# Patient Record
Sex: Female | Born: 1955 | Race: White | Hispanic: No | Marital: Married | Smoking: Former smoker
Health system: Southern US, Community
[De-identification: ages and names within clinical notes are randomized; demographics above are authoritative.]

## PROBLEM LIST (undated history)

## (undated) DIAGNOSIS — F32A Depression, unspecified: Secondary | ICD-10-CM

## (undated) DIAGNOSIS — F329 Major depressive disorder, single episode, unspecified: Secondary | ICD-10-CM

## (undated) DIAGNOSIS — M1990 Unspecified osteoarthritis, unspecified site: Secondary | ICD-10-CM

## (undated) DIAGNOSIS — G4730 Sleep apnea, unspecified: Secondary | ICD-10-CM

## (undated) DIAGNOSIS — F419 Anxiety disorder, unspecified: Secondary | ICD-10-CM

## (undated) HISTORY — PX: BUNIONECTOMY: SHX129

## (undated) HISTORY — PX: COSMETIC SURGERY: SHX468

## (undated) HISTORY — PX: TONSILLECTOMY: SUR1361

## (undated) HISTORY — DX: Anxiety disorder, unspecified: F41.9

## (undated) HISTORY — DX: Depression, unspecified: F32.A

## (undated) HISTORY — PX: NISSEN FUNDOPLICATION: SHX2091

## (undated) HISTORY — PX: JOINT REPLACEMENT: SHX530

## (undated) HISTORY — PX: SPINE SURGERY: SHX786

## (undated) HISTORY — PX: COLONOSCOPY: SHX174

## (undated) SURGERY — Surgical Case
Anesthesia: *Unknown

---
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
## 2006-12-09 HISTORY — PX: ANKLE SURGERY: SHX546

## 2007-12-10 HISTORY — PX: BACK SURGERY: SHX140

## 2010-12-09 HISTORY — PX: REPLACEMENT TOTAL KNEE: SUR1224

## 2019-07-23 ENCOUNTER — Other Ambulatory Visit: Payer: Self-pay

## 2019-07-23 ENCOUNTER — Ambulatory Visit: Payer: PRIVATE HEALTH INSURANCE | Admitting: Family

## 2019-07-23 ENCOUNTER — Encounter: Payer: Self-pay | Admitting: Family

## 2019-07-23 VITALS — BP 98/52 | HR 72 | Temp 98.5°F | Resp 16 | Ht 64.0 in | Wt 217.2 lb

## 2019-07-23 DIAGNOSIS — M7501 Adhesive capsulitis of right shoulder: Secondary | ICD-10-CM

## 2019-07-23 DIAGNOSIS — F419 Anxiety disorder, unspecified: Secondary | ICD-10-CM

## 2019-07-23 DIAGNOSIS — N393 Stress incontinence (female) (male): Secondary | ICD-10-CM | POA: Diagnosis not present

## 2019-07-23 DIAGNOSIS — G4700 Insomnia, unspecified: Secondary | ICD-10-CM | POA: Diagnosis not present

## 2019-07-23 DIAGNOSIS — F32A Depression, unspecified: Secondary | ICD-10-CM

## 2019-07-23 DIAGNOSIS — F329 Major depressive disorder, single episode, unspecified: Secondary | ICD-10-CM

## 2019-07-23 MED ORDER — SERTRALINE HCL 100 MG PO TABS: 100.0000 mg | ORAL_TABLET | Freq: Every day | ORAL | 1 refills | Status: DC

## 2019-07-23 MED ORDER — ZOLPIDEM TARTRATE 5 MG PO TABS: ORAL_TABLET | ORAL | 0 refills | Status: DC

## 2019-07-23 NOTE — Progress Notes (Signed)
Subjective:    Patient ID: Beth HumblesHeather [name], female    DOB: 02/27/1956, 63 y.o.   MRN: [id number]  HPI  Patient is a 63 year old female who presents today to establish care.  She was previously [facility] at [facility] by Dr. Tyson AliasJohn [name].  Past medical history is significant for the following:  Depression/anxiety-she is currently maintained on sertraline 100 mg once daily. Reports that she sometimes forgets doses.    Stress incontinence-stress incontinence is being managed by urology Dr. Gala [name].   Hx of Frozen shoulder- has had stem cells injection and PT. Has not seen anyone recently.  Notes no improvement with meloxicam.    Morbid obesity- Reports that she has lost 32 pounds. Wants to lose 50 more pounds.  Insomnia- reports that she uses tylenol pm every night.  Last night didn't fall asleep until 6 AM.  Sometimes   Review of Systems  Constitutional: Negative for unexpected weight change.  HENT: Negative for hearing loss and rhinorrhea.   Eyes: Negative for visual disturbance.  Respiratory: Negative for cough and shortness of breath.   Cardiovascular: Negative for chest pain and leg swelling.  Gastrointestinal: Negative for blood in stool, constipation and diarrhea.  Genitourinary: Negative for dysuria, frequency and hematuria.  Musculoskeletal: Negative for arthralgias (right shoulder only) and myalgias.  Skin: Negative for rash.  Neurological: Negative for headaches.  Hematological: Negative for adenopathy.  Psychiatric/Behavioral: Negative for sleep disturbance.       See HPI   Past Medical History:  Diagnosis Date  . Anxiety   . Depression      Social History   Socioeconomic History  . Marital status: Married    Spouse name: Beth [name]  . Number of children: 2  . Years of education: Not on file  . Highest education level: Not on file  Occupational History  . Occupation: retired  Engineer, productionocial Needs  . Financial resource strain: Not hard at all  . Food insecurity    Worry: Never true    Inability: Never true  . Transportation needs    Medical: Not on file    Non-medical: No  Tobacco Use  . Smoking status: Former Smoker    Types: Cigarettes    Quit date: 07/22/1989    Years since quitting: 30.0  . Smokeless tobacco: Never Used  Substance and Sexual Activity  . Alcohol use: Yes  . Drug use: Never  . Sexual activity: Yes    Partners: Male  Lifestyle  . Physical activity    Days per week: 1 day    Minutes per session: 30 min  . Stress: Not on file  Relationships  . Social connections    Talks on phone: More than three times a week    Gets together: Never    Attends religious service: More than 4 times per year    Active member of club or organization: No    Attends meetings of clubs or organizations: Never    Relationship status: Married  . Intimate partner violence    Fear of current or ex partner: No    Emotionally abused: No    Physically abused: No    Forced sexual activity: No  Other Topics Concern  . Not on file  Social History Narrative  . Not on file    Past Surgical History:  Procedure Laterality Date  . ANKLE SURGERY Right 2008  . BACK SURGERY  2009  . REPLACEMENT TOTAL KNEE Right 2012    Family History  Problem Relation Age  of Onset  . Breast cancer Mother   . Diabetes Father   . Hydrocephalus Father     Not on File  Current Outpatient Medications on File Prior to Visit  Medication Sig Dispense Refill  . meloxicam (MOBIC) 15 MG tablet Take by mouth.    . sertraline (ZOLOFT) 100 MG tablet TAKE ONE TABLET BY MOUTH ONCE A DAY     No current facility-administered medications on file prior to visit.     BP (!) 98/52 (BP Location: Right Arm, Patient Position: Sitting, Cuff Size: Large)   Pulse 72   Temp 98.5 F (36.9 C) (Oral)   Resp 16   Ht 5\' 4"  (1.626 m)   Wt 217 lb 3.2 oz (98.5 kg)   SpO2 96%   BMI 37.28 kg/m       Objective:   Physical Exam Constitutional:      Appearance: Normal appearance.  She is well-developed. She is obese.  HENT:     Head: Normocephalic and atraumatic.     Right Ear: Tympanic membrane and ear canal normal.     Left Ear: Tympanic membrane and ear canal normal.  Neck:     Musculoskeletal: Neck supple. No muscular tenderness.     Thyroid: No thyromegaly.  Cardiovascular:     Rate and Rhythm: Normal rate and regular rhythm.     Heart sounds: Normal heart sounds. No murmur.  Pulmonary:     Effort: Pulmonary effort is normal. No respiratory distress.     Breath sounds: Normal breath sounds. No wheezing.  Musculoskeletal:        General: No swelling.  Lymphadenopathy:     Cervical: No cervical adenopathy.  Skin:    General: Skin is warm and dry.  Neurological:     Mental Status: She is alert and oriented to person, place, and time.  Psychiatric:        Behavior: Behavior normal.        Thought Content: Thought content normal.        Judgment: Judgment normal.           Assessment & Plan:  Depression/anxiety- stable. See below.  Restart zoloft.  Refill sent.  [facility] 07/23/2019  Decreased Interest 0  Down, Depressed, Hopeless 1  PHQ - 2 Score 1  Altered sleeping 2  Tired, decreased energy 1  Change in appetite 0  Feeling bad or failure about yourself  0  Trouble concentrating 0  Moving slowly or fidgety/restless 0  Suicidal thoughts 0  PHQ-9 Score 4  Difficult doing work/chores Not difficult at all   GAD 7 : Generalized Anxiety Score 07/23/2019  Nervous, Anxious, on Edge 0  Control/stop worrying 1  Worry too much - different things 1  Trouble relaxing 0  Restless 0  Easily annoyed or irritable 1  Afraid - awful might happen 0  Total GAD 7 Score 3  Anxiety Difficulty Not difficult at all   Insomnia- usually controlled but would like something for nights when she can't fall asleep at all. Will initiate ambien for sparing prn use. Controlled substance contract is signed today and UDS will be obtained. Reviewed [place]  controlled substance registry.  Hx of frozen shoulder- we discussed referral to orthopedics at this time and she declines. Will let me know if she changes her mind.   Morbid obesity- she is loosing weight. Encouraged her to keep up the good work and add some regular walking to her routine.   Hx of  stress incontinence- management per urology.

## 2019-07-25 LAB — PAIN MGMT, PROFILE 8 W/CONF, U
6 Acetylmorphine: NEGATIVE ng/mL
Alcohol Metabolites: NEGATIVE ng/mL (ref <500)
Alphahydroxyalprazolam: NEGATIVE ng/mL
Alphahydroxymidazolam: NEGATIVE ng/mL
Alphahydroxytriazolam: NEGATIVE ng/mL
Aminoclonazepam: NEGATIVE ng/mL
Amphetamines: NEGATIVE ng/mL
Benzodiazepines: NEGATIVE ng/mL
Buprenorphine, Urine: NEGATIVE ng/mL
Cocaine Metabolite: NEGATIVE ng/mL
Creatinine: 140.4 mg/dL
Hydroxyethylflurazepam: NEGATIVE ng/mL
Lorazepam: NEGATIVE ng/mL
MDMA: NEGATIVE ng/mL
Marijuana Metabolite: NEGATIVE ng/mL
Nordiazepam: NEGATIVE ng/mL
Opiates: NEGATIVE ng/mL
Oxazepam: NEGATIVE ng/mL
Oxidant: NEGATIVE ug/mL
Oxycodone: NEGATIVE ng/mL
Temazepam: NEGATIVE ng/mL
pH: 7.2 (ref 4.5–9.0)

## 2019-10-25 ENCOUNTER — Other Ambulatory Visit: Payer: Self-pay

## 2019-10-26 ENCOUNTER — Encounter: Payer: Self-pay | Admitting: Family

## 2019-10-26 ENCOUNTER — Ambulatory Visit (INDEPENDENT_AMBULATORY_CARE_PROVIDER_SITE_OTHER): Payer: PRIVATE HEALTH INSURANCE | Admitting: Family

## 2019-10-26 VITALS — BP 106/63 | HR 77 | Temp 96.4°F | Resp 16 | Ht 64.0 in | Wt 220.0 lb

## 2019-10-26 DIAGNOSIS — Z23 Encounter for immunization: Secondary | ICD-10-CM

## 2019-10-26 DIAGNOSIS — G4700 Insomnia, unspecified: Secondary | ICD-10-CM

## 2019-10-26 DIAGNOSIS — L989 Disorder of the skin and subcutaneous tissue, unspecified: Secondary | ICD-10-CM

## 2019-10-26 DIAGNOSIS — Z0000 Encounter for general adult medical examination without abnormal findings: Secondary | ICD-10-CM | POA: Diagnosis not present

## 2019-10-26 DIAGNOSIS — Z1211 Encounter for screening for malignant neoplasm of colon: Secondary | ICD-10-CM

## 2019-10-26 LAB — HEPATIC FUNCTION PANEL
ALT: 20 U/L (ref 0–35)
AST: 15 U/L (ref 0–37)
Albumin: 4.3 g/dL (ref 3.5–5.2)
Alkaline Phosphatase: 117 U/L (ref 39–117)
Bilirubin, Direct: 0.1 mg/dL (ref 0.0–0.3)
Total Bilirubin: 0.4 mg/dL (ref 0.2–1.2)
Total Protein: 6.8 g/dL (ref 6.0–8.3)

## 2019-10-26 LAB — BASIC METABOLIC PANEL
BUN: 17 mg/dL (ref 6–23)
CO2: 29 mEq/L (ref 19–32)
Calcium: 9.1 mg/dL (ref 8.4–10.5)
Chloride: 103 mEq/L (ref 96–112)
Creatinine, Ser: 0.93 mg/dL (ref 0.40–1.20)
GFR: 61.02 mL/min (ref >60.00)
Glucose, Bld: 94 mg/dL (ref 70–99)
Potassium: 4.9 mEq/L (ref 3.5–5.1)
Sodium: 139 mEq/L (ref 135–145)

## 2019-10-26 LAB — CBC WITH DIFFERENTIAL/PLATELET
Basophils Absolute: 0.1 10*3/uL (ref 0.0–0.1)
Basophils Relative: 0.7 % (ref 0.0–3.0)
Eosinophils Absolute: 0.1 10*3/uL (ref 0.0–0.7)
Eosinophils Relative: 1.5 % (ref 0.0–5.0)
HCT: 38.8 % (ref 36.0–46.0)
Hemoglobin: 12.7 g/dL (ref 12.0–15.0)
Lymphocytes Relative: 23.7 % (ref 12.0–46.0)
Lymphs Abs: 1.9 10*3/uL (ref 0.7–4.0)
MCHC: 32.8 g/dL (ref 30.0–36.0)
MCV: 93.1 fl (ref 78.0–100.0)
Monocytes Absolute: 0.4 10*3/uL (ref 0.1–1.0)
Monocytes Relative: 5.4 % (ref 3.0–12.0)
Neutro Abs: 5.5 10*3/uL (ref 1.4–7.7)
Neutrophils Relative %: 68.7 % (ref 43.0–77.0)
Platelets: 279 10*3/uL (ref 150.0–400.0)
RBC: 4.17 Mil/uL (ref 3.87–5.11)
RDW: 13.7 % (ref 11.5–15.5)
WBC: 8.1 10*3/uL (ref 4.0–10.5)

## 2019-10-26 LAB — LIPID PANEL
Cholesterol: 234 mg/dL — ABNORMAL HIGH (ref 0–200)
HDL: 53.7 mg/dL (ref >39.00)
LDL Cholesterol: 142 mg/dL — ABNORMAL HIGH (ref 0–99)
NonHDL: 179.97
Total CHOL/HDL Ratio: 4
Triglycerides: 190 mg/dL — ABNORMAL HIGH (ref 0.0–149.0)
VLDL: 38 mg/dL (ref 0.0–40.0)

## 2019-10-26 LAB — TSH: TSH: 2.35 u[IU]/mL (ref 0.35–4.50)

## 2019-10-26 MED ORDER — TEMAZEPAM 7.5 MG PO CAPS: 7.5000 mg | ORAL_CAPSULE | Freq: Every evening | ORAL | 0 refills | Status: DC | PRN

## 2019-10-26 NOTE — Progress Notes (Signed)
Subjective:    Patient ID: Beth [name], female    DOB: August 02, 1956, 63 y.o.   MRN: [id number]  HPI   Patient presents today for complete physical.  Immunizations: flu shot up to date, shingrix #2 today  Diet: has gained 3 pounds,  Taking a break  From dieting Exercise: walks 3-4 days a week Wt Readings from Last 3 Encounters:  10/26/19 220 lb (99.8 kg)  07/23/19 217 lb 3.2 oz (98.5 kg)  Colonoscopy: 12 years ago,  Dexa:  declines Pap Smear: 2016- declines Mammogram: due  Insomnia- last visit we initiated ambien for prn use.      Review of Systems  Constitutional: Negative for unexpected weight change.  HENT: Negative for hearing loss and rhinorrhea.   Eyes: Negative for visual disturbance.  Respiratory: Negative for cough and shortness of breath.   Cardiovascular: Negative for chest pain.  Gastrointestinal: Negative for constipation and diarrhea.  Genitourinary: Negative for dysuria, frequency and hematuria.  Musculoskeletal: Positive for arthralgias (right shoulder pain only). Negative for myalgias.  Skin:       Notes skin lesion on left dorsal hand  Neurological: Negative for headaches.  Hematological: Negative for adenopathy.  Psychiatric/Behavioral:       Reports stable mood on zoloft.    Past Medical History:  Diagnosis Date  . Anxiety   . Depression      Social History   Socioeconomic History  . Marital status: Married    Spouse name: Ron  . Number of children: 2  . Years of education: Not on file  . Highest education level: Not on file  Occupational History  . Occupation: retired  Engineer, production  . Financial resource strain: Not hard at all  . Food insecurity    Worry: Never true    Inability: Never true  . Transportation needs    Medical: Not on file    Non-medical: No  Tobacco Use  . Smoking status: Former Smoker    Types: Cigarettes    Quit date: 07/22/1989    Years since quitting: 30.2  . Smokeless tobacco: Never Used  Substance and  Sexual Activity  . Alcohol use: Yes  . Drug use: Never  . Sexual activity: Yes    Partners: Male  Lifestyle  . Physical activity    Days per week: 1 day    Minutes per session: 30 min  . Stress: Not on file  Relationships  . Social connections    Talks on phone: More than three times a week    Gets together: Never    Attends religious service: More than 4 times per year    Active member of club or organization: No    Attends meetings of clubs or organizations: Never    Relationship status: Married  . Intimate partner violence    Fear of current or ex partner: No    Emotionally abused: No    Physically abused: No    Forced sexual activity: No  Other Topics Concern  . Not on file  Social History Narrative   Retired Music therapist   Divorced (from [place] originally)   Remarried 2015 (husband is a IT trainer)   Has a dog   2 children (son and daughter) both married, daughter lives in [place], son in [place]. No grandchildren   Enjoys baking, sewing, reading    Past Surgical History:  Procedure Laterality Date  . ANKLE SURGERY Right 2008  . BACK SURGERY  2009  . REPLACEMENT TOTAL KNEE Right 2012  Family History  Problem Relation Age of Onset  . Breast cancer Mother   . Diabetes Father   . Hydrocephalus Father     Not on File  Current Outpatient Medications on File Prior to Visit  Medication Sig Dispense Refill  . sertraline (ZOLOFT) 100 MG tablet Take 1 tablet (100 mg total) by mouth daily. 90 tablet 1  . zolpidem (AMBIEN) 5 MG tablet Take 1 tablet by mouth once daily as needed for insomnia. 15 tablet 0   No current facility-administered medications on file prior to visit.     BP 106/63 (BP Location: Right Arm, Patient Position: Sitting, Cuff Size: Large)   Pulse 77   Temp (!) 96.4 F (35.8 C) (Temporal)   Resp 16   Ht 5\' 4"  (1.626 m)   Wt 220 lb (99.8 kg)   SpO2 97%   BMI 37.76 kg/m        Objective:   Physical Exam  Physical Exam  Constitutional: She is  oriented to person, place, and time. She appears well-developed and well-nourished. No distress.  HENT:  Head: Normocephalic and atraumatic.  Right Ear: Tympanic membrane and ear canal normal.  Left Ear: Tympanic membrane and ear canal normal.  Mouth/Throat: Not examined- pt wearing mask  Eyes: Pupils are equal, round, and reactive to light. No scleral icterus.  Neck: Normal range of motion. No thyromegaly present.  Cardiovascular: Normal rate and regular rhythm.   No murmur heard. Pulmonary/Chest: Effort normal and breath sounds normal. No respiratory distress. He has no wheezes. She has no rales. She exhibits no tenderness.  Abdominal: Soft. Bowel sounds are normal. She exhibits no distension and no mass. There is no tenderness. There is no rebound and no guarding.  Musculoskeletal: She exhibits no edema.  Lymphadenopathy:    She has no cervical adenopathy.  Neurological: She is alert and oriented to person, place, and time. She has normal patellar reflexes. She exhibits normal muscle tone. Coordination normal.  Skin: Skin is warm and dry.  Psychiatric: She has a normal mood and affect. Her behavior is normal. Judgment and thought content normal.  Breasts: Examined lying Right: Without masses, retractions, discharge or axillary adenopathy.  Left: Without masses, retractions, discharge or axillary adenopathy.             Assessment & Plan:          Assessment & Plan:  Preventative care- discussed diet, exercise, weight loss. Refer for mammo. Declines colo/cologuard due to cost but would like to do hemocult. Declines pap due to cost.    Skin lesion- refer to dermatologist- she has seen Dr. Susie Cassette and would like to return to her.  Insomnia- no improvement with Lorrin Mais- has tried temazepam in the past with good result. Will change from ambien to prn temazepam.

## 2019-10-26 NOTE — Patient Instructions (Addendum)
Please complete lab work prior to leaving. Continue your work on healthy diet, exercise and weight loss. Return stool sample by mail at your earliest convenience.  Stop ambien, start temazepam as needed for sleep.

## 2019-10-27 ENCOUNTER — Encounter: Payer: Self-pay | Admitting: Family

## 2020-01-06 ENCOUNTER — Telehealth: Payer: Self-pay | Admitting: Family

## 2020-01-06 DIAGNOSIS — M7500 Adhesive capsulitis of unspecified shoulder: Secondary | ICD-10-CM

## 2020-01-06 NOTE — Telephone Encounter (Signed)
Caller Name: self Phone: [phone] Referral request: Orthopedic Surgeon Reason: frozen right shoulder - this was discussed in OV 10/26/2019 per pt

## 2020-01-06 NOTE — Telephone Encounter (Signed)
Referral requested

## 2020-01-06 NOTE — Addendum Note (Signed)
Addended by: Sandford Craze on: 01/06/2020 04:51 PM   Modules accepted: Orders

## 2020-01-11 ENCOUNTER — Ambulatory Visit (INDEPENDENT_AMBULATORY_CARE_PROVIDER_SITE_OTHER): Payer: PRIVATE HEALTH INSURANCE

## 2020-01-11 ENCOUNTER — Encounter: Payer: Self-pay | Admitting: Orthopaedic Surgery

## 2020-01-11 ENCOUNTER — Ambulatory Visit (INDEPENDENT_AMBULATORY_CARE_PROVIDER_SITE_OTHER): Payer: PRIVATE HEALTH INSURANCE | Admitting: Orthopaedic Surgery

## 2020-01-11 ENCOUNTER — Other Ambulatory Visit: Payer: Self-pay

## 2020-01-11 VITALS — Ht 64.0 in | Wt 225.0 lb

## 2020-01-11 DIAGNOSIS — M25511 Pain in right shoulder: Secondary | ICD-10-CM

## 2020-01-11 DIAGNOSIS — G8929 Other chronic pain: Secondary | ICD-10-CM

## 2020-01-11 DIAGNOSIS — M19011 Primary osteoarthritis, right shoulder: Secondary | ICD-10-CM

## 2020-01-11 NOTE — Progress Notes (Signed)
Office Visit Note   Patient: Beth [name]           Date of Birth: 1955/12/23           MRN: [id number] Visit Date: 01/11/2020              Requested by: Sandford Craze, NP [address] [place],  [place] [address] PCP: Sandford Craze, NP   Assessment & Plan: Visit Diagnoses:  1. Chronic right shoulder pain   2. Primary osteoarthritis, right shoulder     Plan: End-stage osteoarthritis right shoulder with significant pain and limitation of motion.  Will obtain thin slice CT scan right shoulder refer to Dr. August Saucer for consideration of shoulder replacement surgery.  Long discussion discussing x-rays and different treatment options.  Office visit over 45 minutes 50% of the time in counseling  Follow-Up Instructions: No follow-ups on file.   Orders:  Orders Placed This Encounter  Procedures  . XR Shoulder Right  . CT SHOULDER RIGHT WO CONTRAST  . Ambulatory referral to Orthopedic Surgery   No orders of the defined types were placed in this encounter.     Procedures: No procedures performed   Clinical Data: No additional findings.   Subjective: Chief Complaint  Patient presents with  . Right Shoulder - Pain  Patient presents today for right shoulder pain. She said that she has had frozen shoulder for 10years.  She has limited range of motion. Her pain is located all throughout her shoulder and sometimes will move down her arm. She is right hand dominant. She has occasional numbness, tingling, and weakness. She does not take anything for pain. No had therapy on her shoulder 3 years ago.  Frustrated with the limited use of her right arm despite time, medicines and therapy.  Having difficulty performing activities of daily living and even sleeping at night.  HPI  Review of Systems   Objective: Vital Signs: Ht 5\' 4"  (1.626 m)   Wt 225 lb (102.1 kg)   BMI 38.62 kg/m   Physical Exam Constitutional:      Appearance: She is well-developed.  Eyes:      Pupils: Pupils are equal, round, and reactive to light.  Pulmonary:     Effort: Pulmonary effort is normal.  Skin:    General: Skin is warm and dry.  Neurological:     Mental Status: She is alert and oriented to person, place, and time.  Psychiatric:        Behavior: Behavior normal.     Ortho Exam approximately 15 degrees of external rotation right shoulder with pain at that point and about 70 degrees of internal rotation.  I can flex about 120 degrees and abduct about 80 degrees.  Positive crepitation with motion of the glenohumeral joint.  Biceps intact.  Good grip and release.  Skin intact  Specialty Comments:  No specialty comments available.  Imaging: XR Shoulder Right  Result Date: 01/11/2020 Films of the right shoulder obtained in several projections.  There is advanced osteoarthritis involving the glenohumeral joint with irregularity of the humeral head and a very large inferior humeral head osteophyte.  Normal space between the humeral head and the acromion.  Humeral head appears to be centered at the glenoid.  Glenohumeral joint space is narrowed.  Some degenerative changes at the [facility] joint.  No ectopic calcification    PMFS History: Patient Active Problem List   Diagnosis Date Noted  . Primary osteoarthritis, right shoulder 01/11/2020  Past Medical History:  Diagnosis Date  . Anxiety   . Depression     Family History  Problem Relation Age of Onset  . Breast cancer Mother   . Diabetes Father   . Hydrocephalus Father     Past Surgical History:  Procedure Laterality Date  . ANKLE SURGERY Right 2008  . BACK SURGERY  2009  . REPLACEMENT TOTAL KNEE Right 2012   Social History   Occupational History  . Occupation: retired  Tobacco Use  . Smoking status: Former Smoker    Types: Cigarettes    Quit date: 07/22/1989    Years since quitting: 30.4  . Smokeless tobacco: Never Used  Substance and Sexual Activity  . Alcohol use: Yes  . Drug use: Never  .  Sexual activity: Yes    Partners: Male

## 2020-01-19 ENCOUNTER — Other Ambulatory Visit: Payer: Self-pay

## 2020-01-19 ENCOUNTER — Ambulatory Visit (HOSPITAL_BASED_OUTPATIENT_CLINIC_OR_DEPARTMENT_OTHER)
Admission: RE | Admit: 2020-01-19 | Discharge: 2020-01-19 | Disposition: A | Discharge status: Home / Self Care | Payer: No Typology Code available for payment source | Source: Ambulatory Visit | Attending: Orthopaedic Surgery | Admitting: Orthopaedic Surgery

## 2020-01-19 DIAGNOSIS — M25511 Pain in right shoulder: Secondary | ICD-10-CM | POA: Insufficient documentation

## 2020-01-19 DIAGNOSIS — G8929 Other chronic pain: Secondary | ICD-10-CM | POA: Diagnosis present

## 2020-01-26 ENCOUNTER — Other Ambulatory Visit: Payer: Self-pay

## 2020-01-26 ENCOUNTER — Ambulatory Visit (INDEPENDENT_AMBULATORY_CARE_PROVIDER_SITE_OTHER): Payer: PRIVATE HEALTH INSURANCE | Admitting: Orthopedic Surgery

## 2020-01-26 DIAGNOSIS — M19011 Primary osteoarthritis, right shoulder: Secondary | ICD-10-CM | POA: Diagnosis not present

## 2020-01-27 ENCOUNTER — Encounter: Payer: Self-pay | Admitting: Orthopedic Surgery

## 2020-01-27 NOTE — Progress Notes (Signed)
Office Visit Note   Patient: Beth [name]           Date of Birth: Oct 28, 1956           MRN: [id number] Visit Date: 01/26/2020 Requested by: Valeria Batman, MD [address] [place],  [place] [address] PCP: Sandford Craze, NP  Subjective: Chief Complaint  Patient presents with  . Right Shoulder - Pain    HPI: Beth [name] is a 64 year old patient with 10-year history of right shoulder pain.  She worked at [employer] and is now retired.  The pain has become worse over time.  She is relatively low demand at this time in terms of putting stress on her shoulder.  She is right-hand dominant.  The shoulder pain does wake her from sleep at night.  She has tried physical therapy and injections without much relief.  Left shoulder has normal function by report history              ROS: All systems reviewed are negative as they relate to the chief complaint within the history of present illness.  Patient denies  fevers or chills.   Assessment & Plan: Visit Diagnoses:  1. Primary osteoarthritis, right shoulder     Plan: Impression is end-stage arthritis in the right shoulder with some narrowing of the acromiohumeral distance but good rotator cuff strength on exam today.  She also has some glenoid deformity.  She is 64 years old which puts her in a tricky age category for primary shoulder replacement versus reverse shoulder replacement.  In general it would be advisable to try to do some type of primary shoulder replacement if possible.  That may require augmented glenoid versus convertible glenoid depending on the status of the rotator cuff.  If the rotator cuff is not viable then her best course would be to undergo reverse shoulder replacement.  The risk and benefits are discussed.  We do need thin cut CT scan for preoperative evaluation.  It may be a game time decision depending on how that supraspinatus looks as to whether or not she receives augmented glenoid baseplate with primary  shoulder replacement, convertible glenoid baseplate with primary shoulder replacement, versus reverse shoulder replacement.  Follow-Up Instructions: No follow-ups on file.   Orders:  No orders of the defined types were placed in this encounter.  No orders of the defined types were placed in this encounter.     Procedures: No procedures performed   Clinical Data: No additional findings.  Objective: Vital Signs: There were no vitals taken for this visit.  Physical Exam:   Constitutional: Patient appears well-developed HEENT:  Head: Normocephalic Eyes:EOM are normal Neck: Normal range of motion Cardiovascular: Normal rate Pulmonary/chest: Effort normal Neurologic: Patient is alert Skin: Skin is warm Psychiatric: Patient has normal mood and affect    Ortho Exam: Ortho exam demonstrates forward flexion abduction both below 90 degrees.  External rotation at 15 degrees of abduction is about 20 degrees.  Supraspinatus strength subscap strength and infraspinatus strength are 5+ out of 5 right and left.  She has intact deltoid function.  Fair amount of pain with passive range of motion.  Neck range of motion is full.  Motor or sensory function to the hand is intact.  Radial pulse is intact.  Specialty Comments:  No specialty comments available.  Imaging: No results found.   PMFS History: Patient Active Problem List   Diagnosis Date Noted  . Primary osteoarthritis, right shoulder 01/11/2020   Past Medical History:  Diagnosis Date  . Anxiety   . Depression     Family History  Problem Relation Age of Onset  . Breast cancer Mother   . Diabetes Father   . Hydrocephalus Father     Past Surgical History:  Procedure Laterality Date  . ANKLE SURGERY Right 2008  . BACK SURGERY  2009  . REPLACEMENT TOTAL KNEE Right 2012   Social History   Occupational History  . Occupation: retired  Tobacco Use  . Smoking status: Former Smoker    Types: Cigarettes    Quit date:  07/22/1989    Years since quitting: 30.5  . Smokeless tobacco: Never Used  Substance and Sexual Activity  . Alcohol use: Yes  . Drug use: Never  . Sexual activity: Yes    Partners: Male

## 2020-01-28 ENCOUNTER — Other Ambulatory Visit: Payer: Self-pay

## 2020-01-28 DIAGNOSIS — M19011 Primary osteoarthritis, right shoulder: Secondary | ICD-10-CM

## 2020-01-28 NOTE — Progress Notes (Signed)
Tried calling patient to discuss that I had put in new order for CT scan of shoulder per Dr August Saucer. Previous CT did not have the specific thin slice needed for Dr Alfonso Patten PSI that he could potentially use in the even patient needs surgery. I have put in new order and left detailed VM for patient advising.

## 2020-01-31 ENCOUNTER — Encounter: Payer: Self-pay | Admitting: Family

## 2020-02-03 NOTE — Progress Notes (Signed)
Mailed out to pt 

## 2020-02-14 ENCOUNTER — Other Ambulatory Visit: Payer: Self-pay

## 2020-02-14 ENCOUNTER — Ambulatory Visit
Admission: RE | Admit: 2020-02-14 | Discharge: 2020-02-14 | Disposition: A | Discharge status: Home / Self Care | Payer: PRIVATE HEALTH INSURANCE | Source: Ambulatory Visit | Attending: Orthopedic Surgery | Admitting: Orthopedic Surgery

## 2020-02-14 DIAGNOSIS — M19011 Primary osteoarthritis, right shoulder: Secondary | ICD-10-CM

## 2020-02-17 ENCOUNTER — Ambulatory Visit (INDEPENDENT_AMBULATORY_CARE_PROVIDER_SITE_OTHER): Payer: PRIVATE HEALTH INSURANCE | Admitting: Orthopedic Surgery

## 2020-02-17 ENCOUNTER — Other Ambulatory Visit: Payer: Self-pay

## 2020-02-17 DIAGNOSIS — M19011 Primary osteoarthritis, right shoulder: Secondary | ICD-10-CM | POA: Diagnosis not present

## 2020-02-18 ENCOUNTER — Encounter: Payer: Self-pay | Admitting: Orthopedic Surgery

## 2020-02-18 NOTE — Progress Notes (Signed)
Office Visit Note   Patient: Beth [name]           Date of Birth: 05-17-1956           MRN: [id number] Visit Date: 02/17/2020 Requested by: Debbrah Alar, NP [address] [place],  [place] [address] PCP: Debbrah Alar, NP  Subjective: Chief Complaint  Patient presents with  . Follow-up    HPI: Beth [name] is a 64 y.o. female who presents to the office complaining of right shoulder pain.  She returns for right shoulder CT scan review.  She notes continued pain.  She is only taking occasional Tylenol for pain.  She has no history of long use of opioid medication.  She notes that she has no significant weakness of the shoulder but does report decreased range of motion.  She has a history of depression but denies any heart history, pulmonary history, renal history.  Denies any history of diabetes or any personal/family history of blood clots.  She does not smoke..                ROS:  All systems reviewed are negative as they relate to the chief complaint within the history of present illness.  Patient denies fevers or chills.  Assessment & Plan: Visit Diagnoses:  1. Primary osteoarthritis, right shoulder     Plan: Patient is a 64 year old female who presents with CT of right shoulder that shows severe glenohumeral osteoarthritis.  Shoulder replacement was discussed today and patient would like to proceed.  Discussed the risks and benefits of the procedure including nerve and vessel damage, infection, shoulder stiffness, need for revision surgery.  Patient will be posted for total shoulder arthroplasty with convertible baseplate versus a reverse shoulder arthroplasty.  We will make this decision at time of surgery based on how the rotator cuff looks.  She does have good rotator cuff strength on exam.  The type of replacement will be a game time decision for Tinlee.  She does have reasonable strength although on my exam today it is slightly weaker than on  previous exams.  The acromiohumeral interval is definitely smaller on CT scan but there is some of the cuff that appears intact.  If her cuff is intact I would likely favor convertible glenoid.  Has a cuff at all looks involved then we may have to proceed with reverse replacement even though she is 42.  She really cannot live with what she has at this time.  Patient understands the risk and benefits including not limited to infection nerve vessel damage instability as well as potential for revision in her lifetime.  All questions answered  Follow-Up Instructions: No follow-ups on file.   Orders:  No orders of the defined types were placed in this encounter.  No orders of the defined types were placed in this encounter.     Procedures: No procedures performed   Clinical Data: No additional findings.  Objective: Vital Signs: There were no vitals taken for this visit.  Physical Exam:  Constitutional: Patient appears well-developed HEENT:  Head: Normocephalic Eyes:EOM are normal Neck: Normal range of motion Cardiovascular: Normal rate Pulmonary/chest: Effort normal Neurologic: Patient is alert Skin: Skin is warm Psychiatric: Patient has normal mood and affect  Ortho Exam:  Right shoulder Exam Decreased external rotation, forward flexion, abduction compared with the contralateral side.  Painful at terminal range of motion.  Coarseness and grinding is felt during range of motion. Good subscapularis, supraspinatus, and infraspinatus strength  5/5 grip strength, forearm pronation/supination, and bicep strength  Specialty Comments:  No specialty comments available.  Imaging: No results found.   PMFS History: Patient Active Problem List   Diagnosis Date Noted  . Primary osteoarthritis, right shoulder 01/11/2020   Past Medical History:  Diagnosis Date  . Anxiety   . Depression     Family History  Problem Relation Age of Onset  . Breast cancer Mother   . Diabetes Father    . Hydrocephalus Father     Past Surgical History:  Procedure Laterality Date  . ANKLE SURGERY Right 2008  . BACK SURGERY  2009  . REPLACEMENT TOTAL KNEE Right 2012   Social History   Occupational History  . Occupation: retired  Tobacco Use  . Smoking status: Former Smoker    Types: Cigarettes    Quit date: 07/22/1989    Years since quitting: 30.5  . Smokeless tobacco: Never Used  Substance and Sexual Activity  . Alcohol use: Yes  . Drug use: Never  . Sexual activity: Yes    Partners: Male

## 2020-03-16 ENCOUNTER — Other Ambulatory Visit (INDEPENDENT_AMBULATORY_CARE_PROVIDER_SITE_OTHER): Payer: No Typology Code available for payment source

## 2020-03-16 DIAGNOSIS — Z1211 Encounter for screening for malignant neoplasm of colon: Secondary | ICD-10-CM | POA: Diagnosis not present

## 2020-03-16 LAB — FECAL OCCULT BLOOD, IMMUNOCHEMICAL: Fecal Occult Bld: NEGATIVE

## 2020-03-16 NOTE — Addendum Note (Signed)
Addended by: Mervin Kung A on: 03/16/2020 03:28 PM   Modules accepted: Orders

## 2020-03-17 ENCOUNTER — Other Ambulatory Visit (HOSPITAL_COMMUNITY): Payer: PRIVATE HEALTH INSURANCE

## 2020-03-23 ENCOUNTER — Other Ambulatory Visit: Payer: Self-pay

## 2020-03-23 ENCOUNTER — Telehealth: Payer: Self-pay | Admitting: Family

## 2020-03-23 MED ORDER — SERTRALINE HCL 100 MG PO TABS: 100.0000 mg | ORAL_TABLET | Freq: Every day | ORAL | 1 refills | Status: DC

## 2020-03-23 NOTE — Telephone Encounter (Signed)
Medication sent.

## 2020-03-23 NOTE — Telephone Encounter (Signed)
Medication:sertraline (ZOLOFT) 100 MG tablet  Has the patient contacted their pharmacy? No. (If no, request that the patient contact the pharmacy for the refill.) (If yes, when and what did the pharmacy advise?)  Preferred Pharmacy (with phone number or street name): [id number] - [place], [place] - [address] Phone:  [phone]  Fax:  [phone]      Agent: Please be advised that RX refills may take up to 3 business days. We ask that you follow-up with your pharmacy.

## 2020-03-23 NOTE — Progress Notes (Signed)
[place], [place] [address] Phone: [phone] Fax: [phone]  [facility] [address], [place] [address] Phone: [phone] Fax: [phone]      Your procedure is scheduled on Tuesday, March 28, 2020.  Report to [facility] Main Entrance "A" at 10:00 A.M., and check in at the Admitting office.  Call this number if you have problems the morning of surgery:  [phone]  Call [phone] if you have any questions prior to your surgery date Monday-Friday 8am-4pm    Remember:  Do not eat after midnight the night before your surgery  You may drink clear liquids until 9:00 AM the morning of your surgery.   Clear liquids allowed are: Water, Non-Citrus Juices (without pulp), Carbonated Beverages, Clear Tea, Black Coffee Only, and Gatorade  Please complete your PRE-SURGERY ENSURE that was provided to you by 9:00 AM the morning of surgery.  Please, if able, drink it in one setting. DO NOT SIP.     Take these medicines the morning of surgery with A SIP OF WATER:   sertraline (ZOLOFT)    As of today, STOP taking any Aspirin (unless otherwise instructed by your surgeon) and Aspirin containing products, Aleve, Naproxen, Ibuprofen, Motrin, Advil, Goody's, BC's, all herbal medications, fish oil, and all vitamins.                      Do not wear jewelry, make up, or nail polish            Do not wear lotions, powders, perfumes, or deodorant.            Do not shave 48 hours prior to surgery.            Do not bring valuables to the hospital.            [facility] is not responsible for any belongings or valuables.  Do NOT Smoke (Tobacco/Vapping) or drink Alcohol 24 hours prior to your procedure If you use a CPAP at night, you may bring all equipment for your overnight stay.   Contacts, glasses, dentures or  bridgework may not be worn into surgery.      For patients admitted to the hospital, discharge time will be determined by your treatment team.   Patients discharged the day of surgery will not be allowed to drive home, and someone needs to stay with them for 24 hours.                                   [place]- Preparing for Total Shoulder Arthroplasty   Before surgery, you can play an important role. Because skin is not sterile, your skin needs to be as free of germs as possible. You can reduce the number of germs on your skin by using the following products. . Benzoyl Peroxide Gel o Reduces the number of germs present on the skin o Applied twice a day to shoulder area starting two days before surgery   . Chlorhexidine Gluconate (CHG) Soap o An antiseptic cleaner that kills germs and bonds with the skin to continue killing germs even after washing o Used for showering the night before surgery and morning of surgery   Oral Hygiene is also important to reduce your risk of  infection.                                    Remember - BRUSH YOUR TEETH THE MORNING OF SURGERY WITH YOUR REGULAR TOOTHPASTE  ==================================================================  Please follow these instructions carefully:  BENZOYL PEROXIDE 5% GEL  Please do not use if you have an allergy to benzoyl peroxide.   If your skin becomes reddened/irritated stop using the benzoyl peroxide.  Starting two days before surgery, apply as follows: 1. Apply benzoyl peroxide in the morning and at night. Apply after taking a shower. If you are not taking a shower clean entire shoulder front, back, and side along with the armpit with a clean wet washcloth.  2. Place a quarter-sized dollop on your shoulder and rub in thoroughly, making sure to cover the front, back, and side of your shoulder, along with the armpit.   2 days before ____ AM   ____ PM              1 day before ____ AM   ____ PM                            3.  Do this twice a day for two days.  (Last application is the night before surgery, AFTER using the CHG soap as described below).  4. Do NOT apply benzoyl peroxide gel on the day of surgery.  CHLORHEXIDINE GLUCONATE (CHG) SOAP  Please do not use if you have an allergy to CHG or antibacterial soaps. If your skin becomes reddened/irritated stop using the CHG.   Do not shave (including legs and underarms) for at least 48 hours prior to first CHG shower. It is OK to shave your face.  Starting the night before surgery, use CHG soap as follows:  1. Shower the NIGHT BEFORE SURGERY and MORNING OF SURGERY with CHG.  2. If you choose to wash your hair, wash your hair first as usual with your normal shampoo.  3. After shampooing, rinse your hair and body thoroughly to remove the shampoo.  4. Use CHG as you would any other liquid soap.  You can apply CHG directly to the skin and wash gently with a scrungie or a clean washcloth.  5. Apply the CHG soap to your body ONLY FROM THE NECK DOWN.  Do not use on open wounds or open sores.  Avoid contact with your eyes, ears, mouth, and genitals (private parts).  Wash face and genitals (private parts) with your normal soap.  6. Wash thoroughly, paying special attention to the area where your surgery will be performed.  7. Thoroughly rinse your body with warm water from the neck down.  8. DO NOT shower/wash with your normal soap after using and rinsing off the CHG soap.   9. Pat yourself dry with a CLEAN TOWEL.   10.  Apply benzoyl peroxide.   11. Wear CLEAN PAJAMAS to bed the night before surgery; wear comfortable clothes the morning of surgery.  12. Place CLEAN SHEETS on your bed the night of your first shower and DO NOT SLEEP WITH PETS.  Day of Surgery: Shower as above Do not apply any deodorants/lotions.  Please wear clean clothes to the hospital/surgery center.   Remember to brush your teeth WITH YOUR REGULAR TOOTHPASTE.

## 2020-03-24 ENCOUNTER — Encounter (HOSPITAL_COMMUNITY)
Admission: RE | Admit: 2020-03-24 | Discharge: 2020-03-24 | Disposition: A | Discharge status: Home / Self Care | Payer: No Typology Code available for payment source | Source: Ambulatory Visit | Attending: Orthopedic Surgery | Admitting: Orthopedic Surgery

## 2020-03-24 ENCOUNTER — Encounter (HOSPITAL_COMMUNITY): Payer: Self-pay

## 2020-03-24 ENCOUNTER — Telehealth: Payer: Self-pay | Admitting: Orthopedic Surgery

## 2020-03-24 ENCOUNTER — Other Ambulatory Visit: Payer: Self-pay

## 2020-03-24 ENCOUNTER — Other Ambulatory Visit (HOSPITAL_COMMUNITY)
Admission: RE | Admit: 2020-03-24 | Discharge: 2020-03-24 | Disposition: A | Discharge status: Home / Self Care | Payer: No Typology Code available for payment source | Source: Ambulatory Visit | Attending: Orthopedic Surgery | Admitting: Orthopedic Surgery

## 2020-03-24 DIAGNOSIS — Z20822 Contact with and (suspected) exposure to covid-19: Secondary | ICD-10-CM | POA: Insufficient documentation

## 2020-03-24 DIAGNOSIS — Z01812 Encounter for preprocedural laboratory examination: Secondary | ICD-10-CM | POA: Insufficient documentation

## 2020-03-24 HISTORY — DX: Sleep apnea, unspecified: G47.30

## 2020-03-24 HISTORY — DX: Unspecified osteoarthritis, unspecified site: M19.90

## 2020-03-24 LAB — BASIC METABOLIC PANEL
Anion gap: 9 (ref 5–15)
BUN: 16 mg/dL (ref 8–23)
CO2: 23 mmol/L (ref 22–32)
Calcium: 9 mg/dL (ref 8.9–10.3)
Chloride: 110 mmol/L (ref 98–111)
Creatinine, Ser: 0.84 mg/dL (ref 0.44–1.00)
GFR calc Af Amer: >60 mL/min (ref >60)
GFR calc non Af Amer: >60 mL/min (ref >60)
Glucose, Bld: 106 mg/dL — ABNORMAL HIGH (ref 70–99)
Potassium: 4.4 mmol/L (ref 3.5–5.1)
Sodium: 142 mmol/L (ref 135–145)

## 2020-03-24 LAB — CBC
HCT: 40.9 % (ref 36.0–46.0)
Hemoglobin: 12.9 g/dL (ref 12.0–15.0)
MCH: 30.4 pg (ref 26.0–34.0)
MCHC: 31.5 g/dL (ref 30.0–36.0)
MCV: 96.2 fL (ref 80.0–100.0)
Platelets: 311 10*3/uL (ref 150–400)
RBC: 4.25 MIL/uL (ref 3.87–5.11)
RDW: 13.3 % (ref 11.5–15.5)
WBC: 8.6 10*3/uL (ref 4.0–10.5)
nRBC: 0 % (ref 0.0–0.2)

## 2020-03-24 LAB — URINALYSIS, ROUTINE W REFLEX MICROSCOPIC
Bilirubin Urine: NEGATIVE
Glucose, UA: NEGATIVE mg/dL
Ketones, ur: NEGATIVE mg/dL
Nitrite: NEGATIVE
Protein, ur: NEGATIVE mg/dL
Specific Gravity, Urine: 1.013 (ref 1.005–1.030)
pH: 7 (ref 5.0–8.0)

## 2020-03-24 LAB — SARS CORONAVIRUS 2 (TAT 6-24 HRS): SARS Coronavirus 2: NEGATIVE

## 2020-03-24 LAB — SURGICAL PCR SCREEN
MRSA, PCR: NEGATIVE
Staphylococcus aureus: NEGATIVE

## 2020-03-24 NOTE — Progress Notes (Addendum)
Abnormal UA results called into Cordelia Pen at Dr. Diamantina [facility] office. Will follow-up with urine culture.   Viviano Simas, RN

## 2020-03-24 NOTE — Telephone Encounter (Signed)
Beth [name] is scheduled for surgery Tuesday with Dr. August Saucer.  [name] from pre-admission called to make sure Dr. August Saucer was aware of her urinalysis results/culture is still pending.  Please review in chart (urine shows bacteria, leukocytes, and mucus) and advise patient on what to do.

## 2020-03-24 NOTE — Telephone Encounter (Signed)
Pls advise. Thanks.  

## 2020-03-24 NOTE — Progress Notes (Signed)
PCP - Sandford Craze Cardiologist - denies  PPM/ICD - N/A Device Orders -N/A  Rep Notified - N/A  Chest x-ray - N/A EKG - N/A Stress Test -10+ years ago  ECHO - denies Cardiac Cath - denies  Sleep Study - OSA+ CPAP - pt states she uses a mouth guard and not a CPAP  Blood Thinner Instructions:N/A Aspirin Instructions:N/A  ERAS Protcol -Yes, non-diabetic PRE-SURGERY Ensure or G2- pt provided with 1 pre-surgical ENSURE. Pt was provided with the Benzoyl Peroxide at Dr. Diamantina [facility] office prior to PAT appointment. Pt received instructions at PAT appointment.   COVID TEST- Pt to arrive today after PAT appointment. Pt aware to quarantine after testing.    Anesthesia review: No  Patient denies shortness of breath, fever, cough and chest pain at PAT appointment   All instructions explained to the patient, with a verbal understanding of the material. Patient agrees to go over the instructions while at home for a better understanding. Patient also instructed to self quarantine after being tested for COVID-19. The opportunity to ask questions was provided.    Coronavirus Screening  Have you experienced the following symptoms:  Cough yes/no: No Fever (>100.69F)  yes/no: No Runny nose yes/no: No Sore throat yes/no: No Difficulty breathing/shortness of breath  yes/no: No  Have you or a family member traveled in the last 14 days and where? yes/no: No   If the patient indicates "YES" to the above questions, their PAT will be rescheduled to limit the exposure to others and, the surgeon will be notified. THE PATIENT WILL NEED TO BE ASYMPTOMATIC FOR 14 DAYS.   If the patient is not experiencing any of these symptoms, the PAT nurse will instruct them to NOT bring anyone with them to their appointment since they may have these symptoms or traveled as well.   Please remind your patients and families that hospital visitation restrictions are in effect and the importance of the restrictions.

## 2020-03-24 NOTE — Progress Notes (Signed)
[facility] [address] [place] - [place] - [address] [place] [address] Phone: [phone] Fax: [phone]  [facility] [address] [place] - [address] [place] [address] Phone: [phone] Fax: [phone]      Your procedure is scheduled on Tuesday, March 28, 2020.  Report to [facility] Main Entrance "A" at 10:00 A.M., and check in at the Admitting office.  Call this number if you have problems the morning of surgery:  [phone]  Call [phone] if you have any questions prior to your surgery date Monday-Friday 8am-4pm    Remember:  Do not eat after midnight the night before your surgery  You may drink clear liquids until 9:00 AM the morning of your surgery.   Clear liquids allowed are: Water, Non-Citrus Juices (without pulp), Carbonated Beverages, Clear Tea, Black Coffee Only, and Gatorade  Please complete your PRE-SURGERY ENSURE that was provided to you by 9:00 AM the morning of surgery.  Please, if able, drink it in one setting. DO NOT SIP.   Enhanced Recovery after Surgery for Orthopedics Enhanced Recovery after Surgery is a protocol used to improve the stress on your body and your recovery after surgery.  Patient Instructions  . The night before surgery:  o No food after midnight. ONLY clear liquids after midnight  .  Marland Kitchen The day of surgery (if you do NOT have diabetes):  o Drink ONE (1) Pre-Surgery Clear Ensure as directed.   o This drink was given to you during your hospital  pre-op appointment visit. o The pre-op nurse will instruct you on the time to drink the  Pre-Surgery Ensure depending on your surgery time. o Finish the drink at the designated time by the pre-op nurse.  o Nothing else to drink after completing the  Pre-Surgery Clear Ensure.         If you have questions, please contact your surgeon's office.     Take these medicines the morning  of surgery with A SIP OF WATER:  sertraline (ZOLOFT)   As of today, STOP taking any Aspirin (unless otherwise instructed by your surgeon) and Aspirin containing products, Aleve, Naproxen, Ibuprofen, Motrin, Advil, Goody's, BC's, all herbal medications, fish oil, and all vitamins.             Do not wear jewelry, make up, or nail polish            Do not wear lotions, powders, perfumes, or deodorant.            Do not shave 48 hours prior to surgery.            Do not bring valuables to the hospital.            [facility] is not responsible for any belongings or valuables.  Do NOT Smoke (Tobacco/Vapping) or drink Alcohol 24 hours prior to your procedure If you use a CPAP at night, you may bring all equipment for your overnight stay.   Contacts, glasses, dentures or bridgework may not be worn into surgery.      For patients admitted to the hospital, discharge time will be determined by your treatment team.   Patients discharged the day of surgery will not be allowed to drive home, and someone needs to stay with them for 24 hours.  [place]- Preparing for Total Shoulder Arthroplasty   Before surgery, you can play an important role. Because skin is not sterile, your skin needs to be as free of germs as possible. You can reduce the number of germs on your skin by using the following products. . Benzoyl Peroxide Gel o Reduces the number of germs present on the skin o Applied twice a day to shoulder area starting two days before surgery   . Chlorhexidine Gluconate (CHG) Soap o An antiseptic cleaner that kills germs and bonds with the skin to continue killing germs even after washing o Used for showering the night before surgery and morning of surgery   Oral Hygiene is also important to reduce your risk of infection.                                    Remember - BRUSH YOUR TEETH THE MORNING OF SURGERY WITH YOUR REGULAR  TOOTHPASTE  ==================================================================  Please follow these instructions carefully:  BENZOYL PEROXIDE 5% GEL  Please do not use if you have an allergy to benzoyl peroxide.   If your skin becomes reddened/irritated stop using the benzoyl peroxide.  Starting two days before surgery, apply as follows: 1. Apply benzoyl peroxide in the morning and at night. Apply after taking a shower. If you are not taking a shower clean entire shoulder front, back, and side along with the armpit with a clean wet washcloth.  2. Place a quarter-sized dollop on your shoulder and rub in thoroughly, making sure to cover the front, back, and side of your shoulder, along with the armpit.   2 days before ____ AM   ____ PM              1 day before ____ AM   ____ PM                           3.  Do this twice a day for two days.  (Last application is the night before surgery, AFTER using the CHG soap as described below).  4. Do NOT apply benzoyl peroxide gel on the day of surgery.  CHLORHEXIDINE GLUCONATE (CHG) SOAP  Please do not use if you have an allergy to CHG or antibacterial soaps. If your skin becomes reddened/irritated stop using the CHG.   Do not shave (including legs and underarms) for at least 48 hours prior to first CHG shower. It is OK to shave your face.  Starting the night before surgery, use CHG soap as follows:  1. Shower the NIGHT BEFORE SURGERY and MORNING OF SURGERY with CHG.  2. If you choose to wash your hair, wash your hair first as usual with your normal shampoo.  3. After shampooing, rinse your hair and body thoroughly to remove the shampoo.  4. Use CHG as you would any other liquid soap.  You can apply CHG directly to the skin and wash gently with a scrungie or a clean washcloth.  5. Apply the CHG soap to your body ONLY FROM THE NECK DOWN.  Do not use on open wounds or open sores.  Avoid contact with your eyes, ears, mouth, and genitals  (private parts).  Wash face and genitals (private parts) with your normal soap.  6. Wash thoroughly, paying special attention to the area where your surgery will be performed.  7. Thoroughly rinse your body with  warm water from the neck down.  8. DO NOT shower/wash with your normal soap after using and rinsing off the CHG soap.   9. Pat yourself dry with a CLEAN TOWEL.   10.  Apply benzoyl peroxide.   11. Wear CLEAN PAJAMAS to bed the night before surgery; wear comfortable clothes the morning of surgery.  12. Place CLEAN SHEETS on your bed the night of your first shower and DO NOT SLEEP WITH PETS.  Day of Surgery: Shower as above Do not apply any deodorants/lotions.  Please wear clean clothes to the hospital/surgery center.   Remember to brush your teeth WITH YOUR REGULAR TOOTHPASTE.

## 2020-03-25 LAB — URINE CULTURE: Culture: 30000 — AB

## 2020-03-26 ENCOUNTER — Other Ambulatory Visit: Payer: Self-pay | Admitting: Surgical

## 2020-03-26 MED ORDER — AMOXICILLIN 500 MG PO TABS: 500.0000 mg | ORAL_TABLET | Freq: Three times a day (TID) | ORAL | 0 refills | Status: DC

## 2020-03-26 MED ORDER — AMOXICILLIN 500 MG PO CAPS: 500.0000 mg | ORAL_CAPSULE | Freq: Three times a day (TID) | ORAL | 0 refills | Status: DC

## 2020-03-26 MED ORDER — CELECOXIB 200 MG PO CAPS: 200.0000 mg | ORAL_CAPSULE | Freq: Two times a day (BID) | ORAL | 0 refills | Status: DC

## 2020-03-28 ENCOUNTER — Observation Stay (HOSPITAL_COMMUNITY)
Admission: RE | Admit: 2020-03-28 | Discharge: 2020-03-29 | Disposition: A | Discharge status: Home / Self Care | Payer: No Typology Code available for payment source | Attending: Orthopedic Surgery | Admitting: Orthopedic Surgery

## 2020-03-28 ENCOUNTER — Observation Stay (HOSPITAL_COMMUNITY): Payer: No Typology Code available for payment source

## 2020-03-28 ENCOUNTER — Other Ambulatory Visit: Payer: Self-pay

## 2020-03-28 ENCOUNTER — Encounter (HOSPITAL_COMMUNITY)
Admission: RE | Disposition: A | Discharge status: Home / Self Care | Payer: Self-pay | Source: Home / Self Care | Attending: Orthopedic Surgery

## 2020-03-28 ENCOUNTER — Encounter (HOSPITAL_COMMUNITY): Payer: Self-pay | Admitting: Orthopedic Surgery

## 2020-03-28 ENCOUNTER — Ambulatory Visit (HOSPITAL_COMMUNITY): Payer: No Typology Code available for payment source | Admitting: Certified Registered Nurse Anesthetist

## 2020-03-28 DIAGNOSIS — Z79899 Other long term (current) drug therapy: Secondary | ICD-10-CM | POA: Insufficient documentation

## 2020-03-28 DIAGNOSIS — Z9889 Other specified postprocedural states: Secondary | ICD-10-CM

## 2020-03-28 DIAGNOSIS — F329 Major depressive disorder, single episode, unspecified: Secondary | ICD-10-CM | POA: Insufficient documentation

## 2020-03-28 DIAGNOSIS — M19011 Primary osteoarthritis, right shoulder: Secondary | ICD-10-CM | POA: Diagnosis present

## 2020-03-28 DIAGNOSIS — Z87891 Personal history of nicotine dependence: Secondary | ICD-10-CM | POA: Insufficient documentation

## 2020-03-28 DIAGNOSIS — F419 Anxiety disorder, unspecified: Secondary | ICD-10-CM | POA: Insufficient documentation

## 2020-03-28 DIAGNOSIS — G4730 Sleep apnea, unspecified: Secondary | ICD-10-CM | POA: Diagnosis not present

## 2020-03-28 DIAGNOSIS — Z96651 Presence of right artificial knee joint: Secondary | ICD-10-CM | POA: Insufficient documentation

## 2020-03-28 DIAGNOSIS — Z791 Long term (current) use of non-steroidal anti-inflammatories (NSAID): Secondary | ICD-10-CM | POA: Insufficient documentation

## 2020-03-28 DIAGNOSIS — M19019 Primary osteoarthritis, unspecified shoulder: Secondary | ICD-10-CM | POA: Diagnosis present

## 2020-03-28 HISTORY — PX: TOTAL SHOULDER ARTHROPLASTY: SHX126

## 2020-03-28 SURGERY — ARTHROPLASTY, SHOULDER, TOTAL
Anesthesia: General | Site: Shoulder | Laterality: Right

## 2020-03-28 MED ORDER — ONDANSETRON HCL 4 MG/2ML IJ SOLN
INTRAMUSCULAR | Status: AC
  Filled 2020-03-28: qty 2

## 2020-03-28 MED ORDER — ACETAMINOPHEN 500 MG PO TABS
1000.0000 mg | ORAL_TABLET | Freq: Four times a day (QID) | ORAL | Status: DC
  Administered 2020-03-28 – 2020-03-29 (×2): 1000 mg via ORAL
  Filled 2020-03-28 (×2): qty 2

## 2020-03-28 MED ORDER — ASPIRIN 81 MG PO CHEW
81.0000 mg | CHEWABLE_TABLET | Freq: Every day | ORAL | Status: DC
  Administered 2020-03-29: 81 mg via ORAL
  Filled 2020-03-28: qty 1

## 2020-03-28 MED ORDER — ONDANSETRON HCL 4 MG/2ML IJ SOLN
INTRAMUSCULAR | Status: DC | PRN
  Administered 2020-03-28: 4 mg via INTRAVENOUS

## 2020-03-28 MED ORDER — ONDANSETRON HCL 4 MG/2ML IJ SOLN: 4.0000 mg | Freq: Once | INTRAMUSCULAR | Status: DC | PRN

## 2020-03-28 MED ORDER — CEFAZOLIN SODIUM 1 G IJ SOLR
INTRAMUSCULAR | Status: AC
  Filled 2020-03-28: qty 30

## 2020-03-28 MED ORDER — LACTATED RINGERS IV SOLN: INTRAVENOUS | Status: DC

## 2020-03-28 MED ORDER — FENTANYL CITRATE (PF) 100 MCG/2ML IJ SOLN: 50.0000 ug | Freq: Once | INTRAMUSCULAR | Status: AC

## 2020-03-28 MED ORDER — ACETAMINOPHEN 500 MG PO TABS: 1000.0000 mg | ORAL_TABLET | Freq: Once | ORAL | Status: DC

## 2020-03-28 MED ORDER — BUPIVACAINE LIPOSOME 1.3 % IJ SUSP
INTRAMUSCULAR | Status: DC | PRN
  Administered 2020-03-28: 10 mL via PERINEURAL

## 2020-03-28 MED ORDER — METOCLOPRAMIDE HCL 5 MG PO TABS: 5.0000 mg | ORAL_TABLET | Freq: Three times a day (TID) | ORAL | Status: DC | PRN

## 2020-03-28 MED ORDER — METHOCARBAMOL 1000 MG/10ML IJ SOLN
500.0000 mg | Freq: Four times a day (QID) | INTRAVENOUS | Status: DC | PRN
  Filled 2020-03-28: qty 5

## 2020-03-28 MED ORDER — FENTANYL CITRATE (PF) 250 MCG/5ML IJ SOLN
INTRAMUSCULAR | Status: DC | PRN
  Administered 2020-03-28: 100 ug via INTRAVENOUS

## 2020-03-28 MED ORDER — IRRISEPT - 450ML BOTTLE WITH 0.05% CHG IN STERILE WATER, USP 99.95% OPTIME
TOPICAL | Status: DC | PRN
  Administered 2020-03-28: 450 mL

## 2020-03-28 MED ORDER — CELECOXIB 200 MG PO CAPS
200.0000 mg | ORAL_CAPSULE | Freq: Two times a day (BID) | ORAL | Status: DC
  Administered 2020-03-28 – 2020-03-29 (×2): 200 mg via ORAL
  Filled 2020-03-28 (×2): qty 1

## 2020-03-28 MED ORDER — LIDOCAINE 2% (20 MG/ML) 5 ML SYRINGE
INTRAMUSCULAR | Status: AC
  Filled 2020-03-28: qty 5

## 2020-03-28 MED ORDER — DOCUSATE SODIUM 100 MG PO CAPS
100.0000 mg | ORAL_CAPSULE | Freq: Two times a day (BID) | ORAL | Status: DC
  Administered 2020-03-28 – 2020-03-29 (×2): 100 mg via ORAL
  Filled 2020-03-28 (×2): qty 1

## 2020-03-28 MED ORDER — MENTHOL 3 MG MT LOZG: 1.0000 | LOZENGE | OROMUCOSAL | Status: DC | PRN

## 2020-03-28 MED ORDER — FENTANYL CITRATE (PF) 250 MCG/5ML IJ SOLN
INTRAMUSCULAR | Status: AC
  Filled 2020-03-28: qty 5

## 2020-03-28 MED ORDER — BUPIVACAINE-EPINEPHRINE (PF) 0.5% -1:200000 IJ SOLN
INTRAMUSCULAR | Status: DC | PRN
  Administered 2020-03-28: 15 mL via PERINEURAL

## 2020-03-28 MED ORDER — VANCOMYCIN HCL 1000 MG IV SOLR
INTRAVENOUS | Status: AC
  Filled 2020-03-28: qty 1000

## 2020-03-28 MED ORDER — PHENOL 1.4 % MT LIQD: 1.0000 | OROMUCOSAL | Status: DC | PRN

## 2020-03-28 MED ORDER — METOCLOPRAMIDE HCL 5 MG/ML IJ SOLN: 5.0000 mg | Freq: Three times a day (TID) | INTRAMUSCULAR | Status: DC | PRN

## 2020-03-28 MED ORDER — VANCOMYCIN HCL 1000 MG IV SOLR
INTRAVENOUS | Status: DC | PRN
  Administered 2020-03-28: 1000 mg

## 2020-03-28 MED ORDER — ONDANSETRON HCL 4 MG PO TABS: 4.0000 mg | ORAL_TABLET | Freq: Four times a day (QID) | ORAL | Status: DC | PRN

## 2020-03-28 MED ORDER — FENTANYL CITRATE (PF) 100 MCG/2ML IJ SOLN
INTRAMUSCULAR | Status: AC
  Administered 2020-03-28: 50 ug via INTRAVENOUS
  Filled 2020-03-28: qty 2

## 2020-03-28 MED ORDER — 0.9 % SODIUM CHLORIDE (POUR BTL) OPTIME
TOPICAL | Status: DC | PRN
  Administered 2020-03-28 (×4): 1000 mL

## 2020-03-28 MED ORDER — EPHEDRINE SULFATE-NACL 50-0.9 MG/10ML-% IV SOSY
PREFILLED_SYRINGE | INTRAVENOUS | Status: DC | PRN
  Administered 2020-03-28 (×2): 10 mg via INTRAVENOUS

## 2020-03-28 MED ORDER — SERTRALINE HCL 100 MG PO TABS
100.0000 mg | ORAL_TABLET | Freq: Every day | ORAL | Status: DC
  Administered 2020-03-29: 100 mg via ORAL
  Filled 2020-03-28: qty 1
  Filled 2020-03-28: qty 2

## 2020-03-28 MED ORDER — METHOCARBAMOL 500 MG PO TABS: 500.0000 mg | ORAL_TABLET | Freq: Four times a day (QID) | ORAL | Status: DC | PRN

## 2020-03-28 MED ORDER — PROPOFOL 10 MG/ML IV BOLUS
INTRAVENOUS | Status: DC | PRN
  Administered 2020-03-28: 40 mg via INTRAVENOUS
  Administered 2020-03-28: 160 mg via INTRAVENOUS

## 2020-03-28 MED ORDER — EPHEDRINE 5 MG/ML INJ
INTRAVENOUS | Status: AC
  Filled 2020-03-28: qty 10

## 2020-03-28 MED ORDER — LIDOCAINE 2% (20 MG/ML) 5 ML SYRINGE
INTRAMUSCULAR | Status: DC | PRN
  Administered 2020-03-28: 40 mg via INTRAVENOUS

## 2020-03-28 MED ORDER — OXYCODONE HCL 5 MG PO TABS: 5.0000 mg | ORAL_TABLET | ORAL | Status: DC | PRN

## 2020-03-28 MED ORDER — DEXAMETHASONE SODIUM PHOSPHATE 10 MG/ML IJ SOLN
INTRAMUSCULAR | Status: DC | PRN
  Administered 2020-03-28: 10 mg via INTRAVENOUS

## 2020-03-28 MED ORDER — MIDAZOLAM HCL 2 MG/2ML IJ SOLN
INTRAMUSCULAR | Status: AC
  Administered 2020-03-28: 1 mg via INTRAVENOUS
  Filled 2020-03-28: qty 2

## 2020-03-28 MED ORDER — PROPOFOL 10 MG/ML IV BOLUS
INTRAVENOUS | Status: AC
  Filled 2020-03-28: qty 20

## 2020-03-28 MED ORDER — PHENYLEPHRINE HCL-NACL 10-0.9 MG/250ML-% IV SOLN
INTRAVENOUS | Status: DC | PRN
  Administered 2020-03-28: 25 ug/min via INTRAVENOUS

## 2020-03-28 MED ORDER — MIDAZOLAM HCL 2 MG/2ML IJ SOLN: 1.0000 mg | Freq: Once | INTRAMUSCULAR | Status: AC

## 2020-03-28 MED ORDER — CEFAZOLIN SODIUM-DEXTROSE 2-4 GM/100ML-% IV SOLN
2.0000 g | Freq: Four times a day (QID) | INTRAVENOUS | Status: AC
  Administered 2020-03-28 – 2020-03-29 (×2): 2 g via INTRAVENOUS
  Filled 2020-03-28 (×2): qty 100

## 2020-03-28 MED ORDER — SUGAMMADEX SODIUM 200 MG/2ML IV SOLN
INTRAVENOUS | Status: DC | PRN
  Administered 2020-03-28: 200 mg via INTRAVENOUS

## 2020-03-28 MED ORDER — CHLORHEXIDINE GLUCONATE 4 % EX LIQD: 60.0000 mL | Freq: Once | CUTANEOUS | Status: DC

## 2020-03-28 MED ORDER — CEFAZOLIN SODIUM-DEXTROSE 2-4 GM/100ML-% IV SOLN
2.0000 g | INTRAVENOUS | Status: AC
  Administered 2020-03-28: 13:00:00 2 g via INTRAVENOUS
  Filled 2020-03-28: qty 100

## 2020-03-28 MED ORDER — MIDAZOLAM HCL 2 MG/2ML IJ SOLN
INTRAMUSCULAR | Status: DC | PRN
  Administered 2020-03-28: 2 mg via INTRAVENOUS

## 2020-03-28 MED ORDER — TRANEXAMIC ACID-NACL 1000-0.7 MG/100ML-% IV SOLN
1000.0000 mg | INTRAVENOUS | Status: AC
  Administered 2020-03-28: 14:00:00 1000 mg via INTRAVENOUS
  Filled 2020-03-28: qty 100

## 2020-03-28 MED ORDER — HYDROMORPHONE HCL 1 MG/ML IJ SOLN: 0.5000 mg | INTRAMUSCULAR | Status: DC | PRN

## 2020-03-28 MED ORDER — DEXAMETHASONE SODIUM PHOSPHATE 10 MG/ML IJ SOLN
INTRAMUSCULAR | Status: AC
  Filled 2020-03-28: qty 1

## 2020-03-28 MED ORDER — ROCURONIUM BROMIDE 10 MG/ML (PF) SYRINGE
PREFILLED_SYRINGE | INTRAVENOUS | Status: AC
  Filled 2020-03-28: qty 10

## 2020-03-28 MED ORDER — PHENYLEPHRINE HCL (PRESSORS) 10 MG/ML IV SOLN
INTRAVENOUS | Status: AC
  Filled 2020-03-28: qty 3

## 2020-03-28 MED ORDER — FENTANYL CITRATE (PF) 100 MCG/2ML IJ SOLN: 25.0000 ug | INTRAMUSCULAR | Status: DC | PRN

## 2020-03-28 MED ORDER — ROCURONIUM BROMIDE 10 MG/ML (PF) SYRINGE
PREFILLED_SYRINGE | INTRAVENOUS | Status: DC | PRN
  Administered 2020-03-28: 60 mg via INTRAVENOUS

## 2020-03-28 MED ORDER — ONDANSETRON HCL 4 MG/2ML IJ SOLN: 4.0000 mg | Freq: Four times a day (QID) | INTRAMUSCULAR | Status: DC | PRN

## 2020-03-28 MED ORDER — MIDAZOLAM HCL 2 MG/2ML IJ SOLN
INTRAMUSCULAR | Status: AC
  Filled 2020-03-28: qty 2

## 2020-03-28 SURGICAL SUPPLY — 86 items
ALCOHOL 70% 16 OZ (MISCELLANEOUS) ×3 IMPLANT
BASEPLATE AUG MED W-TAPER (Plate) ×3 IMPLANT
BEARING HUMERAL SHLDER 36M STD (Shoulder) ×1 IMPLANT
BIT DRILL 2.7 W/STOP DISP (BIT) ×2 IMPLANT
BIT DRILL 2.7MM W/STOP DISP (BIT) ×1
BIT DRILL TWIST 2.7 (BIT) ×2 IMPLANT
BIT DRILL TWIST 2.7MM (BIT) ×1
BLADE SAW SGTL 13.0X1.19X90.0M (BLADE) ×3 IMPLANT
BLADE SAW SGTL 13X75X1.27 (BLADE) ×3 IMPLANT
CHLORAPREP W/TINT 26 (MISCELLANEOUS) ×6 IMPLANT
CLOSURE WOUND 1/2 X4 (GAUZE/BANDAGES/DRESSINGS) ×1
COVER SURGICAL LIGHT HANDLE (MISCELLANEOUS) ×3 IMPLANT
COVER WAND RF STERILE (DRAPES) ×3 IMPLANT
DRAPE INCISE IOBAN 66X45 STRL (DRAPES) ×3 IMPLANT
DRAPE U-SHAPE 47X51 STRL (DRAPES) ×6 IMPLANT
DRSG AQUACEL AG ADV 3.5X10 (GAUZE/BANDAGES/DRESSINGS) ×3 IMPLANT
ELECT BLADE 4.0 EZ CLEAN MEGAD (MISCELLANEOUS)
ELECT REM PT RETURN 9FT ADLT (ELECTROSURGICAL) ×3
ELECTRODE BLDE 4.0 EZ CLN MEGD (MISCELLANEOUS) IMPLANT
ELECTRODE REM PT RTRN 9FT ADLT (ELECTROSURGICAL) ×1 IMPLANT
GAUZE SPONGE 4X4 12PLY STRL LF (GAUZE/BANDAGES/DRESSINGS) ×3 IMPLANT
GLENOID SPHERE STD STRL 36MM (Orthopedic Implant) ×3 IMPLANT
GLOVE BIO SURGEON STRL SZ7 (GLOVE) ×3 IMPLANT
GLOVE BIOGEL PI IND STRL 7.0 (GLOVE) ×1 IMPLANT
GLOVE BIOGEL PI IND STRL 8 (GLOVE) ×1 IMPLANT
GLOVE BIOGEL PI INDICATOR 7.0 (GLOVE) ×2
GLOVE BIOGEL PI INDICATOR 8 (GLOVE) ×2
GLOVE ECLIPSE 7.0 STRL STRAW (GLOVE) ×3 IMPLANT
GLOVE ECLIPSE 8.0 STRL XLNG CF (GLOVE) ×3 IMPLANT
GOWN STRL REUS W/ TWL LRG LVL3 (GOWN DISPOSABLE) ×2 IMPLANT
GOWN STRL REUS W/ TWL XL LVL3 (GOWN DISPOSABLE) ×1 IMPLANT
GOWN STRL REUS W/TWL LRG LVL3 (GOWN DISPOSABLE) ×4
GOWN STRL REUS W/TWL XL LVL3 (GOWN DISPOSABLE) ×2
GUIDE MODEL REV SHLD RT (ORTHOPEDIC DISPOSABLE SUPPLIES) ×3 IMPLANT
HYDROGEN PEROXIDE 16OZ (MISCELLANEOUS) ×3 IMPLANT
JET LAVAGE IRRISEPT WOUND (IRRIGATION / IRRIGATOR) ×3
KIT BASIN OR (CUSTOM PROCEDURE TRAY) ×3 IMPLANT
KIT TURNOVER KIT B (KITS) ×3 IMPLANT
LAVAGE JET IRRISEPT WOUND (IRRIGATION / IRRIGATOR) ×1 IMPLANT
LOOP VESSEL MAXI BLUE (MISCELLANEOUS) ×3 IMPLANT
MANIFOLD NEPTUNE II (INSTRUMENTS) ×3 IMPLANT
NDL SUT 6 .5 CRC .975X.05 MAYO (NEEDLE) ×1 IMPLANT
NEEDLE HYPO 25GX1X1/2 BEV (NEEDLE) IMPLANT
NEEDLE MAYO TAPER (NEEDLE) ×2
NS IRRIG 1000ML POUR BTL (IV SOLUTION) ×3 IMPLANT
PACK SHOULDER (CUSTOM PROCEDURE TRAY) ×3 IMPLANT
PAD ARMBOARD 7.5X6 YLW CONV (MISCELLANEOUS) ×6 IMPLANT
PIN THREADED REVERSE (PIN) ×6 IMPLANT
REAMER GUIDE BUSHING SURG DISP (MISCELLANEOUS) ×6 IMPLANT
REAMER GUIDE W/SCREW AUG (MISCELLANEOUS) ×3 IMPLANT
RESTRAINT HEAD UNIVERSAL NS (MISCELLANEOUS) ×3 IMPLANT
RETRIEVER SUT HEWSON (MISCELLANEOUS) ×3 IMPLANT
SCREW BONE STRL 6.5MMX30MM (Screw) ×3 IMPLANT
SCREW LOCKING 4.75MMX15MM (Screw) ×6 IMPLANT
SCREW LOCKING NS 4.75MMX20MM (Screw) ×3 IMPLANT
SCREW LOCKING STRL 4.75X25X3.5 (Screw) ×3 IMPLANT
SHOULDER HUMERAL BEAR 36M STD (Shoulder) ×3 IMPLANT
SLING ARM IMMOBILIZER LRG (SOFTGOODS) ×3 IMPLANT
SOL PREP POV-IOD 4OZ 10% (MISCELLANEOUS) ×3 IMPLANT
SPONGE LAP 18X18 RF (DISPOSABLE) ×3 IMPLANT
STEM SHOULDER (Stem) ×3 IMPLANT
STRIP CLOSURE SKIN 1/2X4 (GAUZE/BANDAGES/DRESSINGS) ×2 IMPLANT
SUCTION FRAZIER HANDLE 10FR (MISCELLANEOUS) ×2
SUCTION TUBE FRAZIER 10FR DISP (MISCELLANEOUS) ×1 IMPLANT
SUT BROADBAND TAPE 2PK 1.5 (SUTURE) ×6 IMPLANT
SUT FIBERWIRE #2 38 T-5 BLUE (SUTURE)
SUT MAXBRAID (SUTURE) IMPLANT
SUT MNCRL AB 3-0 PS2 18 (SUTURE) ×3 IMPLANT
SUT SILK 2 0 TIES 10X30 (SUTURE) ×3 IMPLANT
SUT VIC AB 0 CT1 27 (SUTURE) ×10
SUT VIC AB 0 CT1 27XBRD ANBCTR (SUTURE) ×5 IMPLANT
SUT VIC AB 1 CT1 27 (SUTURE) ×10
SUT VIC AB 1 CT1 27XBRD ANBCTR (SUTURE) ×5 IMPLANT
SUT VIC AB 2-0 CT1 27 (SUTURE) ×12
SUT VIC AB 2-0 CT1 TAPERPNT 27 (SUTURE) ×6 IMPLANT
SUT VICRYL 0 UR6 27IN ABS (SUTURE) ×18 IMPLANT
SUTURE FIBERWR #2 38 T-5 BLUE (SUTURE) IMPLANT
SUTURE TAPE 1.3 40 TPR END (SUTURE) IMPLANT
SUTURETAPE 1.3 40 TPR END (SUTURE)
SYR CONTROL 10ML LL (SYRINGE) IMPLANT
TOWEL GREEN STERILE (TOWEL DISPOSABLE) ×3 IMPLANT
TOWEL GREEN STERILE FF (TOWEL DISPOSABLE) ×3 IMPLANT
TRAY FOL W/BAG SLVR 16FR STRL (SET/KITS/TRAYS/PACK) IMPLANT
TRAY FOLEY W/BAG SLVR 16FR LF (SET/KITS/TRAYS/PACK)
TRAY HUM REV SHOULDER STD +6 (Shoulder) ×3 IMPLANT
WATER STERILE IRR 1000ML POUR (IV SOLUTION) ×3 IMPLANT

## 2020-03-28 NOTE — Op Note (Signed)
NAMELYNDSI, ALTIC MEDICAL RECORD VQ:[id number] ACCOUNT [id number] DATE OF BIRTH:1956-08-12 FACILITY: [facility] LOCATION: [facility] PHYSICIAN:Chick Cousins Randel Pigg, MD  OPERATIVE REPORT  DATE OF PROCEDURE:  03/28/2020  PREOPERATIVE DIAGNOSIS:  Right shoulder arthritis.  POSTOPERATIVE DIAGNOSIS:  Right shoulder arthritis.  PROCEDURE:  Right reverse shoulder replacement using Biomet comprehensive shoulder system mini humeral stem, 10 x 83 with mini humeral tray, standard thickness +6 mm taper offset, 40 mm in diameter with highly cross-linked polyethylene bearing, standard  36 mm in diameter with medium augmented baseplate, glenosphere standard 36 mm with 1 central compression screw and 4 peripheral locking screws.  SURGEON:  Meredith Pel, MD  ASSISTANT:  Annie Main, PA.  INDICATIONS:  The patient is a 64 year old patient with right shoulder pain.  She has a longstanding history of arthritis refractory to nonoperative management.  She presents now for operative management after explanation of risks and benefits.  PROCEDURE IN DETAIL:  The patient was brought to the operating room where general anesthetic was induced.  Preoperative antibiotics administered.  Timeout was called.  The patient was placed in the beach chair position with the head in neutral position.   Right shoulder had been undergoing protocol benzoyl peroxide washing for the past 3 mornings, prepped with hydrogen peroxide, followed by alcohol and Betadine, which was allowed to air dry, then prepped with ChloraPrep solution and draped in a sterile  manner.  Ioban used to cover the operative field.  Timeout was called.  The patient had very restricted range of motion to begin with.  She had only about 10 degrees of external rotation and only about 50 degrees of isolated forward flexion and abduction  passively.  A deltopectoral approach was made.  The cephalic vein was mobilized medially.  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  The anterior portion of the deltoid was mobilized manually to increase exposure.  The biceps  was tenodesed to the pec tendon, which was released about 1.5 cm in order to facilitate exposure.  Next, the circumflex vessels were ligated.  The axillary nerve was then visualized, palpated, vessel loop was placed around it, and it was protected at  all times during the remaining portion of the case.  The rotator interval was then opened.  Subscapularis was then detached from the lesser tuberosity.  The patient had significant attenuated tearing of the supraspinatus and the decision was made at that  time based on that, plus the significant flexion contractures and highly unlikely ability to repair the subscap due to restricted motion to perform reverse replacement. Next, the inferior osteophyte was removed.  We stayed within the capsule to remove  that osteophyte, which was large.  Next, the shoulder was dislocated.  The top of the humerus was entered.  Supraspinatus had very attenuated tearing present at its attachment site following about 75% of the footprint of the tendon.  The coracohumeral  ligament was released.  The humerus was reamed up to 10 mm and cut in about 25 degrees of retroversion.  A stem was placed and then attention was directed towards the glenoid.  The patient had significant contractures.  Capsular release was performed  circumferentially around the labrum.  The capsule was released in the medial to lateral direction in order to facilitate exposure.  I had my finger on the axillary nerve while that capsule was being released from the 6 o'clock position in order to  facilitate exposure.  Triceps was also released with a Cobb  elevator.  The patient had a large osteophyte anteriorly.  The patient-specific guide was then placed onto the glenoid face and 2 pins were placed.  Good correct placement was achieved with  downward tilt.  Reaming was then performed.   Augment was required posteriorly, which was a medium augment.  This was placed.  The glenosphere was then placed with excellent purchase and the central screw and 4 peripheral locking screws were placed.  The  superior locking screw was not bicortical.  The glenosphere was then placed and then attention was directed towards the humerus.  A size 10 stem was placed and the patient did have very good stability with a +6 humeral tray and 36 standard diameter  liner.  This had very good stability with adduction and anterior stress as well as with internal and external rotation.  Trial component removed on the humeral side.  Four drill holes placed to the lesser tuberosity for subscap repair.  True stem was  then placed with excellent stability achieved.  Same stability parameters were maintained.  Difficult to dislocate and relocate.  Axillary nerve was palpated and intact.  Vessel loop removed.  Thorough irrigation was then performed.  Vancomycin powder  placed within the joint.  The subscapularis was then repaired with the arm in 30 degrees of external rotation.  Supraspinatus was attenuated and not repaired.  Next, thorough irrigation again performed.  Vancomycin powder placed over the deltopectoral  closure and then the rest of the skin was closed using 0 Vicryl suture, 2-0 Vicryl suture and a 3-0 Monocryl.  Steri-Strips, Aquacel dressing placed.  Shoulder immobilizer placed.  Luke's assistance was required at all times during the case for  retraction, opening and closing.  His assistance was a medical necessity.  VN/NUANCE  D:03/28/2020 T:03/28/2020 JOB:[id number]

## 2020-03-28 NOTE — Anesthesia Procedure Notes (Signed)
Anesthesia Regional Block: Interscalene brachial plexus block   Pre-Anesthetic Checklist: ,, timeout performed, Correct Patient, Correct Site, Correct Laterality, Correct Procedure, Correct Position, site marked, Risks and benefits discussed,  Surgical consent,  Pre-op evaluation,  At surgeon's request and post-op pain management  Laterality: Right  Prep: chloraprep       Needles:  Injection technique: Single-shot  Needle Type: Echogenic Stimulator Needle     Needle Length: 9cm  Needle Gauge: 21     Additional Needles:   Procedures:, nerve stimulator,,, ultrasound used (permanent image in chart),,,,   Nerve Stimulator or Paresthesia:  Response: deltoid and bicep, 0.5 mA,   Additional Responses:   Narrative:  Start time: 03/28/2020 12:01 PM End time: 03/28/2020 12:07 PM Injection made incrementally with aspirations every 5 mL.  Performed by: Personally  Anesthesiologist: Marcene Duos, MD

## 2020-03-28 NOTE — Brief Op Note (Signed)
   03/28/2020  4:48 PM  PATIENT:  Beth [name]  64 y.o. female  PRE-OPERATIVE DIAGNOSIS:  right shoulder osteoarthritis  POST-OPERATIVE DIAGNOSIS:  right shoulder osteoarthritis  PROCEDURE:  Procedure(s): RIGHT SHOULDER REPLACEMENT  SURGEON:  Surgeon(s): Cammy Copa, MD  ASSISTANT: Magnant pa  ANESTHESIA:   general  EBL: 200 ml    Total I/O In: 1600 [I.V.:1500; IV Piggyback:100] Out: 1200 [Urine:1000; Blood:200]  BLOOD ADMINISTERED: none  DRAINS: none   LOCAL MEDICATIONS USED:  none  SPECIMEN:  No Specimen  COUNTS:  YES  TOURNIQUET:  * No tourniquets in log *  DICTATION: .Other Dictation: Dictation Number [id number]  PLAN OF CARE: Admit for overnight observation  PATIENT DISPOSITION:  PACU - hemodynamically stable

## 2020-03-28 NOTE — Anesthesia Preprocedure Evaluation (Signed)
Anesthesia Evaluation  Patient identified by MRN, date of birth, ID band Patient awake    Reviewed: Allergy & Precautions, NPO status , Patient's Chart, lab work & pertinent test results  Airway Mallampati: II  TM Distance: >3 FB Neck ROM: Full    Dental  (+) Dental Advisory Given   Pulmonary sleep apnea , former smoker,    breath sounds clear to auscultation       Cardiovascular negative cardio ROS   Rhythm:Regular Rate:Normal     Neuro/Psych negative neurological ROS     GI/Hepatic negative GI ROS, Neg liver ROS,   Endo/Other  negative endocrine ROS  Renal/GU negative Renal ROS     Musculoskeletal  (+) Arthritis ,   Abdominal   Peds  Hematology negative hematology ROS (+)   Anesthesia Other Findings   Reproductive/Obstetrics                             Anesthesia Physical Anesthesia Plan  ASA: III  Anesthesia Plan: General   Post-op Pain Management:  Regional for Post-op pain   Induction: Intravenous  PONV Risk Score and Plan: 3 and Dexamethasone, Ondansetron, Treatment may vary due to age or medical condition and Midazolam  Airway Management Planned: Oral ETT  Additional Equipment: None  Intra-op Plan:   Post-operative Plan: Extubation in OR and Possible Post-op intubation/ventilation  Informed Consent: I have reviewed the patients History and Physical, chart, labs and discussed the procedure including the risks, benefits and alternatives for the proposed anesthesia with the patient or authorized representative who has indicated his/her understanding and acceptance.     Dental advisory given  Plan Discussed with: CRNA  Anesthesia Plan Comments:         Anesthesia Quick Evaluation

## 2020-03-28 NOTE — H&P (Signed)
Beth [name] is an 64 y.o. female.   Chief Complaint: Right shoulder pain HPI: Beth [name] is a 64 year old female with right shoulder pain.  She has end-stage glenohumeral arthritis.  Good rotator cuff strength on exam but some narrowing of the acromiohumeral distance is noted on radiographs and CT scan.  After failed conservative management she presents now for patient specific instrumentation guided shoulder replacement either anatomic versus reverse based on status of the rotator cuff.  Past Medical History:  Diagnosis Date  . Anxiety   . Arthritis   . Depression   . Sleep apnea    does not uses CPAP; uses mouth guard.     Past Surgical History:  Procedure Laterality Date  . ANKLE SURGERY Right 2008  . BACK SURGERY  2009  . REPLACEMENT TOTAL KNEE Right 2012  . TONSILLECTOMY      Family History  Problem Relation Age of Onset  . Breast cancer Mother   . Diabetes Father   . Hydrocephalus Father    Social History:  reports that she quit smoking about 30 years ago. Her smoking use included cigarettes. She has never used smokeless tobacco. She reports current alcohol use. She reports that she does not use drugs.  Allergies: No Known Allergies  Medications Prior to Admission  Medication Sig Dispense Refill  . amoxicillin (AMOXIL) 500 MG tablet Take 1 tablet (500 mg total) by mouth every 8 (eight) hours. 9 tablet 0  . b complex vitamins tablet Take 1 tablet by mouth daily.    Marland Kitchen BIOTIN PO Take 1 tablet by mouth daily.    Marland Kitchen CALCIUM PO Take 1 tablet by mouth daily.    . celecoxib (CELEBREX) 200 MG capsule Take 1 capsule (200 mg total) by mouth 2 (two) times daily. Additionally, take 400mg  (2 capsules) on the morning of your surgery as soon as you wake up.  Take with a small sip of water and with no food. 60 capsule 0  . Cholecalciferol (VITAMIN D) 50 MCG (2000 UT) CAPS Take 2,000 Units by mouth daily.    . ferrous sulfate 325 (65 FE) MG tablet Take 325 mg by mouth daily with breakfast.     . Glucosamine HCl-MSM (GLUCOSAMINE-MSM PO) Take 2 tablets by mouth daily.    . Multiple Minerals (CALCIUM-MAGNESIUM-ZINC) TABS Take 3 tablets by mouth daily.    . sertraline (ZOLOFT) 100 MG tablet Take 1 tablet (100 mg total) by mouth daily. 90 tablet 1  . temazepam (RESTORIL) 7.5 MG capsule Take 1 capsule (7.5 mg total) by mouth at bedtime as needed for sleep. 30 capsule 0  . zolpidem (AMBIEN) 5 MG tablet Take 5 mg by mouth at bedtime as needed for sleep.      No results found for this or any previous visit (from the past 48 hour(s)). No results found.  Review of Systems  Musculoskeletal: Positive for arthralgias.  All other systems reviewed and are negative.   Blood pressure (!) 144/68, pulse 74, temperature 98.4 F (36.9 C), resp. rate 18, height 5\' 4"  (1.626 m), weight 104.3 kg, SpO2 95 %. Physical Exam  Constitutional: She appears well-developed.  HENT:  Head: Normocephalic.  Eyes: Pupils are equal, round, and reactive to light.  Cardiovascular: Normal rate.  Respiratory: Effort normal.  Musculoskeletal:     Cervical back: Normal range of motion.  Neurological: She is alert.  Skin: Skin is warm.  Psychiatric: She has a normal mood and affect.   Examination of the right shoulder demonstrates pretty reasonable  subscapularis strength supraspinatus strength and infraspinatus strength.  She does have limited external rotation of 15 degrees abduction to slightly less than 30 degrees.  Forward flexion abduction also limited actively.  Motor sensory function of the hand is intact.  Deltoid does fire.  Assessment/Plan Impression is right shoulder arthritis.  Plan is right shoulder replacement.  This will be reverse shoulder replacement versus standard shoulder replacement.  Risk benefits are discussed including but limited to infection nerve vessel damage dislocation as well as potential need for revision surgery.  Patient understands the risk and benefits and wishes to proceed.  All  questions answered.  Anderson Malta, MD 03/28/2020, 12:33 PM

## 2020-03-28 NOTE — Transfer of Care (Signed)
Immediate Anesthesia Transfer of Care Note  Patient: Davin Muramoto  Procedure(s) Performed: RIGHT SHOULDER REPLACEMENT (Right Shoulder)  Patient Location: PACU  Anesthesia Type:General  Level of Consciousness: awake  Airway & Oxygen Therapy: Patient Spontanous Breathing and Patient connected to face mask oxygen  Post-op Assessment: Report given to RN  Post vital signs: Reviewed and stable  Last Vitals:  Vitals Value Taken Time  BP    Temp    Pulse 106 03/28/20 1711  Resp 9 03/28/20 1711  SpO2 91 % 03/28/20 1711  Vitals shown include unvalidated device data.  Last Pain:  Vitals:   03/28/20 1210  PainSc: 0-No pain      Patients Stated Pain Goal: 2 (03/28/20 1039)  Complications: No apparent anesthesia complications

## 2020-03-29 ENCOUNTER — Encounter: Payer: Self-pay | Admitting: *Deleted

## 2020-03-29 DIAGNOSIS — M19011 Primary osteoarthritis, right shoulder: Secondary | ICD-10-CM | POA: Diagnosis not present

## 2020-03-29 MED ORDER — OXYCODONE HCL 5 MG PO TABS: 5.0000 mg | ORAL_TABLET | ORAL | 0 refills | Status: DC | PRN

## 2020-03-29 MED ORDER — ASPIRIN 81 MG PO CHEW: 81.0000 mg | CHEWABLE_TABLET | Freq: Every day | ORAL | 0 refills | Status: DC

## 2020-03-29 MED ORDER — METHOCARBAMOL 500 MG PO TABS: 500.0000 mg | ORAL_TABLET | Freq: Three times a day (TID) | ORAL | 0 refills | Status: DC | PRN

## 2020-03-29 NOTE — Evaluation (Signed)
Occupational Therapy Evaluation Patient Details Name: Beth [name] MRN: [id number] DOB: 01-04-1956 Today's Date: 03/29/2020    History of Present Illness Pt admitted for elective R reverse sld replacement. PMH: anxiety, depression, sleep apnea. PSH: ankle surgery, back surgery, R TKA   Clinical Impression   Patient evaluated by Occupational Therapy with no further acute OT needs identified. All education has been completed and the patient has no further questions. See below for any follow-up Occupational Therapy or equipment needs. OT to sign off. Thank you for referral.      Follow Up Recommendations  No OT follow up;Follow surgeon's recommendation for DC plan and follow-up therapies    Equipment Recommendations  None recommended by OT    Recommendations for Other Services       Precautions / Restrictions Precautions Precautions: Shoulder Shoulder Interventions: Shoulder sling/immobilizer;At all times Precaution Booklet Issued: No(OT to provide) Precaution Comments: handout provided and  [employer] : [id number] Required Braces or Orthoses: Sling(at all times) Restrictions Weight Bearing Restrictions: Yes RUE Weight Bearing: Non weight bearing Other Position/Activity Restrictions: NO AROM, pendulums okay, wrist/hand and elbow ROM okay      Mobility Bed Mobility Overal bed mobility: Modified Independent             General bed mobility comments: oob in chair  Transfers Overall transfer level: Needs assistance Equipment used: None Transfers: Sit to/from Stand Sit to Stand: Supervision         General transfer comment: pt steady, no difficulty    Balance Overall balance assessment: No apparent balance deficits (not formally assessed)                                         ADL either performed or assessed with clinical judgement   ADL Overall ADL's : Needs assistance/impaired Eating/Feeding: Set up   Grooming: Set up   Upper Body  Bathing: Set up   Lower Body Bathing: Set up   Upper Body Dressing : Set up   Lower Body Dressing: Set up   Toilet Transfer: Set up Toilet Transfer Details (indicate cue type and reason): has attachment to toilet to make a bidget         Functional mobility during ADLs: Supervision/safety General ADL Comments: pt dressing during session and talking in detail about bra options. pt wearing bra from home that hits on incision site. pt insist on wearing bra.    Shoulder precautions ( handout provided): Educated patient on don doff brace with return demonstration,washing under arms with cloth, never to wash directly on incision site, avoid shoulder movement. positioning with pillows in chair for , sleeping positioning (recommend recliner if possible), pt educated on dressing care during bathing/ dressing.  Home exercise program as stated below (indicated by MD).   Vision Baseline Vision/History: Wears glasses Wears Glasses: At all times       Perception     Praxis      Pertinent Vitals/Pain Pain Assessment: 0-10 Pain Score: 3  Pain Location: R shld Pain Descriptors / Indicators: Aching;Sore     Hand Dominance Right   Extremity/Trunk Assessment Upper Extremity Assessment Upper Extremity Assessment: RUE deficits/detail RUE Deficits / Details: shoulder precautions/ dominant arm   Lower Extremity Assessment Lower Extremity Assessment: Defer to PT evaluation   Cervical / Trunk Assessment Cervical / Trunk Assessment: Normal   Communication Communication Communication: No difficulties   Cognition Arousal/Alertness: Awake/alert Behavior  During Therapy: WFL for tasks assessed/performed Overall Cognitive Status: Within Functional Limits for tasks assessed                                     General Comments  educated on ice and position of arm in chair and sling    Exercises Exercises: Shoulder;Other exercises Shoulder Exercises Pendulum Exercise:  PROM;Left;15 reps;Standing Other Exercises Other Exercises: pt currently with nerve block and decrease activation of  LUE. pt is starting to move digits.  Other Exercises: pt reports having restless legs and normally stretching before bed on the floor. pt educated on using robe belt for calf stretch seated and sitting on bed for butterfly stretch   Shoulder Instructions Shoulder Instructions Donning/doffing shirt without moving shoulder: Supervision/safety Method for sponge bathing under operated UE: Supervision/safety Donning/doffing sling/immobilizer: Supervision/safety Correct positioning of sling/immobilizer: Supervision/safety Pendulum exercises (written home exercise program): Supervision/safety ROM for elbow, wrist and digits of operated UE: Supervision/safety Sling wearing schedule (on at all times/off for ADL's): Supervision/safety Proper positioning of operated UE when showering: Supervision/safety Positioning of UE while sleeping: Boomer expects to be discharged to:: Private residence Living Arrangements: Spouse/significant other Available Help at Discharge: Family;Available 24 hours/day Type of Home: House Home Access: Stairs to enter [employer] of Steps: 2 Entrance Stairs-Rails: None Home Layout: One level     Bathroom Shower/Tub: Occupational psychologist: Standard Bathroom Accessibility: Yes   Home Equipment: Shower seat;Grab bars - tub/shower;Hand held shower head   Additional Comments: adjustable bed, reports having CPM chair at home but no order noted for OT, dog at home      Prior Functioning/Environment Level of Independence: Independent                 OT Problem List: Decreased strength;Decreased activity tolerance;Decreased range of motion;Pain      OT Treatment/Interventions:      OT Goals(Current goals can be found in the care plan section) Acute Rehab OT Goals Patient Stated  Goal: To be able to use arm OT Goal Formulation: With patient Potential to Achieve Goals: Good  OT Frequency:     Barriers to D/C:            Co-evaluation              AM-PAC OT "6 Clicks" Daily Activity     Outcome Measure Help from another person eating meals?: None Help from another person taking care of personal grooming?: None Help from another person toileting, which includes using toliet, bedpan, or urinal?: None Help from another person bathing (including washing, rinsing, drying)?: None Help from another person to put on and taking off regular upper body clothing?: None Help from another person to put on and taking off regular lower body clothing?: None 6 Click Score: 24   End of Session Nurse Communication: Mobility status;Precautions  Activity Tolerance: Patient tolerated treatment well Patient left: in chair;with call bell/phone within reach  OT Visit Diagnosis: Unsteadiness on feet (R26.81);Muscle weakness (generalized) (M62.81);Pain Pain - Right/Left: Right Pain - part of body: Arm                Time: 6283-1517 OT Time Calculation (min): 57 min Charges:  OT General Charges $OT Visit: 1 Visit OT Evaluation $OT Eval Moderate Complexity: 1 Mod OT Treatments $Self Care/Home Management : 38-52 mins   Brynn, OTR/L  Acute  Rehabilitation Services Pager: [phone] Office: [phone] .   Mateo Flow 03/29/2020, 10:49 AM

## 2020-03-29 NOTE — Anesthesia Postprocedure Evaluation (Signed)
Anesthesia Post Note  Patient: Ayanna Gheen  Procedure(s) Performed: RIGHT SHOULDER REPLACEMENT (Right Shoulder)     Patient location during evaluation: PACU Anesthesia Type: General Level of consciousness: awake and alert Pain management: pain level controlled Vital Signs Assessment: post-procedure vital signs reviewed and stable Respiratory status: spontaneous breathing, nonlabored ventilation and respiratory function stable Cardiovascular status: blood pressure returned to baseline, stable and tachycardic Postop Assessment: no apparent nausea or vomiting Anesthetic complications: no    Last Vitals:  Vitals:   03/29/20 0349 03/29/20 0729  BP: 137/72 133/71  Pulse: 87 (!) 102  Resp: 18   Temp: 36.8 C 36.8 C  SpO2: 95% 92%    Last Pain:  Vitals:   03/29/20 0729  TempSrc: Oral  PainSc:                  Beryle Lathe

## 2020-03-29 NOTE — Evaluation (Signed)
Physical Therapy Evaluation and Discharge Patient Details Name: Beth [name] MRN: [id number] DOB: 05/07/1956 Today's Date: 03/29/2020   History of Present Illness  Pt admitted for elective R reverse sld replacement. PMH: anxiety, depression, sleep apnea. PSH: ankle surgery, back surgery, R TKA    Clinical Impression  Pt admitted with above. Pt functioning at supervision level and will have 24/7 assist. OT to educate on shoulder precautions. Pt with no further ACUTE PT NEEDS at this time. PT SIGNING OFF. PLease re-consult if needed in future.    Follow Up Recommendations No PT follow up    Equipment Recommendations  None recommended by PT    Recommendations for Other Services       Precautions / Restrictions Precautions Precautions: Shoulder Shoulder Interventions: Shoulder sling/immobilizer;At all times Precaution Booklet Issued: No(OT to provide) Required Braces or Orthoses: Sling(at all times) Restrictions Weight Bearing Restrictions: Yes RUE Weight Bearing: Non weight bearing Other Position/Activity Restrictions: NO AROM, pendulums okay, wrist/hand and elbow ROM okay      Mobility  Bed Mobility Overal bed mobility: Modified Independent             General bed mobility comments: HOB elevated, able to use trunk and and L UE to bring self to EOB  Transfers Overall transfer level: Needs assistance Equipment used: None Transfers: Sit to/from Stand Sit to Stand: Supervision         General transfer comment: pt steady, no difficulty  Ambulation/Gait Ambulation/Gait assistance: Supervision Gait Distance (Feet): 300 Feet Assistive device: None Gait Pattern/deviations: WFL(Within Functional Limits) Gait velocity: wfl Gait velocity interpretation: >2.62 ft/sec, indicative of community ambulatory General Gait Details: mild SOB, increased speed  Stairs Stairs: Yes Stairs assistance: Min assist Stair Management: Step to pattern;Forwards(L HHA to mimic home set  up) Number of Stairs: 2 General stair comments: no hand rail at home, instructed pt on how to instruct spouse to assist pt on stairs  Wheelchair Mobility    Modified Rankin (Stroke Patients Only)       Balance Overall balance assessment: No apparent balance deficits (not formally assessed)                                           Pertinent Vitals/Pain Pain Assessment: 0-10 Pain Score: 3  Pain Location: R shld Pain Descriptors / Indicators: Aching    Home Living Family/patient expects to be discharged to:: Private residence Living Arrangements: Spouse/significant other Available Help at Discharge: Family;Available 24 hours/day Type of Home: House Home Access: Stairs to enter Entrance Stairs-Rails: None Entrance Stairs-Number of Steps: 2 Home Layout: One level Home Equipment: Shower seat;Grab bars - tub/shower;Hand held shower head Additional Comments: adjustable bed    Prior Function Level of Independence: Independent               Hand Dominance   Dominant Hand: Right    Extremity/Trunk Assessment   Upper Extremity Assessment Upper Extremity Assessment: RUE deficits/detail RUE Deficits / Details: shoulder precautions    Lower Extremity Assessment Lower Extremity Assessment: Overall WFL for tasks assessed    Cervical / Trunk Assessment Cervical / Trunk Assessment: Normal  Communication   Communication: No difficulties  Cognition Arousal/Alertness: Awake/alert Behavior During Therapy: WFL for tasks assessed/performed Overall Cognitive Status: Within Functional Limits for tasks assessed  General Comments General comments (skin integrity, edema, etc.): incision covered by dressing    Exercises     Assessment/Plan    PT Assessment Patent does not need any further PT services  PT Problem List Decreased strength;Decreased range of motion;Decreased activity tolerance        PT Treatment Interventions      PT Goals (Current goals can be found in the Care Plan section)  Acute Rehab PT Goals PT Goal Formulation: All assessment and education complete, DC therapy Potential to Achieve Goals: Good    Frequency     Barriers to discharge        Co-evaluation               AM-PAC PT "6 Clicks" Mobility  Outcome Measure Help needed turning from your back to your side while in a flat bed without using bedrails?: None Help needed moving from lying on your back to sitting on the side of a flat bed without using bedrails?: None Help needed moving to and from a bed to a chair (including a wheelchair)?: None Help needed standing up from a chair using your arms (e.g., wheelchair or bedside chair)?: None Help needed to walk in hospital room?: None Help needed climbing 3-5 steps with a railing? : A Little 6 Click Score: 23    End of Session Equipment Utilized During Treatment: (sling) Activity Tolerance: Patient tolerated treatment well Patient left: in chair;with call bell/phone within reach Nurse Communication: Mobility status PT Visit Diagnosis: Muscle weakness (generalized) (M62.81)    Time: 3710-6269 PT Time Calculation (min) (ACUTE ONLY): 21 min   Charges:   PT Evaluation $PT Eval Low Complexity: 1 Low          Kittie Plater, PT, DPT Acute Rehabilitation Services Pager #: [phone] Office #: [phone]   Berline Lopes 03/29/2020, 8:48 AM

## 2020-03-29 NOTE — Plan of Care (Signed)
Pt given D/C instructions with verbal understanding. Rx's were sent to Pt's pharmacy by MD. Pt's incision is clean and dry with no sign of infection. Pt's IV was removed prior to D/C. Pt D/C'd home via wheelchair per MD order. Pt is stable @ D/C and has no other needs at this time. Valynn Schamberger, RN  

## 2020-03-29 NOTE — Progress Notes (Addendum)
  Subjective: Beth [name] is a 64 y.o. female s/p right RSA.  They are POD1.  Pt's pain is controlled.  Block wearing off. No dizziness with ambulation.  Has CPM machine at home. Ready for discharge home.    Objective: Vital signs in last 24 hours: Temp:  [97 F (36.1 C)-98.5 F (36.9 C)] 98.3 F (36.8 C) (04/21 0729) Pulse Rate:  [87-108] 102 (04/21 0729) Resp:  [9-23] 18 (04/21 0349) BP: (127-142)/(57-77) 133/71 (04/21 0729) SpO2:  [91 %-96 %] 92 % (04/21 0729)  Intake/Output from previous day: 04/20 0701 - 04/21 0700 In: 1700 [I.V.:1600; IV Piggyback:100] Out: 1325 [Urine:1125; Blood:200] Intake/Output this shift: No intake/output data recorded.  Exam:  No gross blood or drainage overlying the dressing 2+ radial pulse Sensation intact distally in the right hand Able to extend the right wrist. EPL, IP flexion, finger abduction/adduction intact.  Axillary nerve appears intact with deltoid felt to be firing   Labs: No results for input(s): HGB in the last 72 hours. No results for input(s): WBC, RBC, HCT, PLT in the last 72 hours. No results for input(s): NA, K, CL, CO2, BUN, CREATININE, GLUCOSE, CALCIUM in the last 72 hours. No results for input(s): LABPT, INR in the last 72 hours.  Assessment/Plan: Pt is POD1 s/p right RSA    -Plan to discharge to home today  -No lifting with the operative arm  -Okay to shower, dressing is waterproof.  Cautioned patient against soaking dressing in bath/pool/body of water  -Use the CPM machine at least 3 times per day for one hour each time, increasing the degrees daily.     Aaric Dolph L Roza Creamer 03/29/2020, 12:12 PM

## 2020-03-30 ENCOUNTER — Telehealth: Payer: Self-pay

## 2020-03-30 DIAGNOSIS — Z96611 Presence of right artificial shoulder joint: Secondary | ICD-10-CM

## 2020-03-30 DIAGNOSIS — M19011 Primary osteoarthritis, right shoulder: Secondary | ICD-10-CM

## 2020-03-30 DIAGNOSIS — Z96612 Presence of left artificial shoulder joint: Secondary | ICD-10-CM

## 2020-03-30 NOTE — Addendum Note (Signed)
Addended byPrescott Parma on: 03/30/2020 03:54 PM   Modules accepted: Orders

## 2020-03-30 NOTE — Telephone Encounter (Signed)
Ok for [employer] and prom as tolerated 2 x week 6 w pls clala thx

## 2020-03-30 NOTE — Telephone Encounter (Signed)
IC LMVM advising patient we were referring to out patient therapy since her insurance will not cover any home health.

## 2020-03-30 NOTE — Telephone Encounter (Signed)
Beth [name] with [facility] called stated that patient does not have any benefits for home health with any facility with the type of insurance she has. Do you want to refer patient to out patient? If so, any specifics?  Please advise.

## 2020-04-05 ENCOUNTER — Inpatient Hospital Stay: Payer: PRIVATE HEALTH INSURANCE | Admitting: Orthopedic Surgery

## 2020-04-07 ENCOUNTER — Telehealth: Payer: Self-pay | Admitting: Orthopedic Surgery

## 2020-04-07 NOTE — Telephone Encounter (Signed)
Pls advise. Thanks.  

## 2020-04-07 NOTE — Telephone Encounter (Signed)
Ok to go to 110 max

## 2020-04-07 NOTE — Telephone Encounter (Signed)
Pt called stating she was instructed to call when her arm machine got to 90 degrees and the pt states that will happen today. The pt would like a call back with further instructions.   [phone]

## 2020-04-10 NOTE — Telephone Encounter (Signed)
I called s/w patient and advised verbalized understanding.

## 2020-04-12 ENCOUNTER — Encounter: Payer: Self-pay | Admitting: Physical Therapy

## 2020-04-12 ENCOUNTER — Ambulatory Visit (INDEPENDENT_AMBULATORY_CARE_PROVIDER_SITE_OTHER): Payer: No Typology Code available for payment source

## 2020-04-12 ENCOUNTER — Other Ambulatory Visit: Payer: Self-pay

## 2020-04-12 ENCOUNTER — Ambulatory Visit (INDEPENDENT_AMBULATORY_CARE_PROVIDER_SITE_OTHER): Payer: No Typology Code available for payment source | Admitting: Orthopedic Surgery

## 2020-04-12 ENCOUNTER — Ambulatory Visit (INDEPENDENT_AMBULATORY_CARE_PROVIDER_SITE_OTHER): Payer: No Typology Code available for payment source | Admitting: Physical Therapy

## 2020-04-12 DIAGNOSIS — Z96611 Presence of right artificial shoulder joint: Secondary | ICD-10-CM

## 2020-04-12 DIAGNOSIS — R600 Localized edema: Secondary | ICD-10-CM | POA: Diagnosis not present

## 2020-04-12 DIAGNOSIS — M25511 Pain in right shoulder: Secondary | ICD-10-CM

## 2020-04-12 DIAGNOSIS — M25611 Stiffness of right shoulder, not elsewhere classified: Secondary | ICD-10-CM | POA: Diagnosis not present

## 2020-04-12 MED ORDER — CELECOXIB 200 MG PO CAPS: 200.0000 mg | ORAL_CAPSULE | Freq: Every day | ORAL | 0 refills | Status: DC | PRN

## 2020-04-12 NOTE — Patient Instructions (Signed)
Access Code: [email or web address] URL: [email or web address] Date: 04/12/2020 Prepared by: Narda Amber  Exercises Supine Shoulder Flexion Extension AAROM with Dowel - 2-3 x daily - 7 x weekly - 2 sets - 10 reps - 5 seconds hold Supine Shoulder External Rotation in 45 Degrees Abduction AAROM with Dowel - 2-3 x daily - 7 x weekly - 2 sets - 10 reps - 5 seconds hold Seated Shoulder Scaption Slide at Table Top with Forearm in Neutral - 2-3 x daily - 7 x weekly - 2 sets - 10 reps - 5 seconds hold Seated Shoulder Flexion Towel Slide at Table Top - 2-3 x daily - 7 x weekly - 2 sets - 10 reps - 5 seconds hold Seated Shoulder External Rotation PROM on Table - 2-3 x daily - 7 x weekly - 2 sets - 10 reps - 5 seconds hold

## 2020-04-12 NOTE — Therapy (Signed)
[facility] [address] [place], [place], [address] Phone: [phone]   Fax:  [phone]  Physical Therapy Evaluation  Patient Details  Name: Beth [name] MRN: [id number] Date of Birth: September 28, 1956 Referring Provider (PT): Rise Paganini MD   Encounter Date: 04/12/2020  PT End of Session - 04/12/20 1522    Visit Number  1    Number of Visits  17    Date for PT Re-Evaluation  05/26/20    PT Start Time  1430    PT Stop Time  1513    PT Time Calculation (min)  43 min    Activity Tolerance  Patient tolerated treatment well    Behavior During Therapy  [facility] for tasks assessed/performed       Past Medical History:  Diagnosis Date  . Anxiety   . Arthritis   . Depression   . Sleep apnea    does not uses CPAP; uses mouth guard.     Past Surgical History:  Procedure Laterality Date  . ANKLE SURGERY Right 2008  . BACK SURGERY  2009  . REPLACEMENT TOTAL KNEE Right 2012  . TONSILLECTOMY    . TOTAL SHOULDER ARTHROPLASTY Right 03/28/2020   Procedure: RIGHT SHOULDER REPLACEMENT;  Surgeon: Cammy Copa, MD;  Location: [facility] OR;  Service: Orthopedics;  Laterality: Right;    There were no vitals filed for this visit.   Subjective Assessment - 04/12/20 1438    Subjective  Pt arriving to therpay reporting 4/10 pain in R shoulder following R reverse total shoulder on 03/28/2020 performed by Dr. August Saucer.    Pertinent History  arthritis, R TKA, depression, sleep apnea    Patient Stated Goals  Get back to lifting and normal ADL's.    Currently in Pain?  Yes    Pain Score  4     Pain Location  Shoulder    Pain Orientation  Right    Pain Descriptors / Indicators  Sore    Pain Type  Acute pain;Surgical pain    Pain Onset  More than a month ago    Pain Frequency  Intermittent    Aggravating Factors   movements    Pain Relieving Factors  resting    Effect of Pain on Daily Activities  difficulty lifting, and performing some house hold activities          [facility] PT Assessment - 04/12/20 0001      Assessment   Medical Diagnosis  R reverse shoulder    Referring Provider (PT)  Rise Paganini MD    Hand Dominance  Right    Prior Therapy  yes for TKA      Precautions   Precautions  Shoulder      Balance Screen   Has the patient fallen in the past 6 months  No    Is the patient reluctant to leave their home because of a fear of falling?   No      Home Environment   Living Environment  Private residence    Living Arrangements  Spouse/significant other    Home Access  Stairs to enter      Cognition   Overall Cognitive Status  Within Functional Limits for tasks assessed      ROM / Strength   AROM / PROM / Strength  AROM;PROM;Strength      AROM   AROM Assessment Site  Shoulder    Right/Left Shoulder  Right;Left    Right Shoulder Extension  25 Degrees  Right Shoulder Flexion  120 Degrees    Right Shoulder Internal Rotation  --   Thumb to R glute   Right Shoulder External Rotation  25 Degrees    Left Shoulder Extension  55 Degrees    Left Shoulder Flexion  170 Degrees    Left Shoulder ABduction  160 Degrees    Left Shoulder Internal Rotation  --   thumb to T8   Left Shoulder External Rotation  80 Degrees      PROM   PROM Assessment Site  Shoulder    Right/Left Shoulder  Right    Right Shoulder Extension  30 Degrees    Right Shoulder Flexion  136 Degrees    Right Shoulder External Rotation  32 Degrees                Objective measurements completed on examination: See above findings.              PT Education - 04/12/20 1521    Education Details  PT POC, HEP, Physician orders discussed, self soft tissue mobilization using tennis ball.    Person(s) Educated  Patient    Methods  Explanation;Demonstration    Comprehension  Verbalized understanding;Returned demonstration;Need further instruction          PT Long Term Goals - 04/12/20 1625      PT LONG TERM GOAL #1   Title  Pt will be  indepdent in her HEP and progression.    Time  6    Period  Weeks    Status  New      PT LONG TERM GOAL #2   Title  Pt will be able to improve her R shoulder flexion to >/= 150 degrees.    Baseline  see flowsheets    Time  6    Period  Weeks    Status  New      PT LONG TERM GOAL #3   Title  Pt will be albe to improve R shoulder ER to >/= 60 degrees.    Baseline  see flowsheets    Time  6    Period  Weeks    Status  New      PT LONG TERM GOAL #4   Title  Pt will be able to report completeing her ADL's with pain </= 2/10.    Time  6    Period  Weeks    Status  New             Plan - 04/12/20 1523    Clinical Impression Statement  Pt presenting today s/p Right reverse shoulder replacement on 03/28/2020. Pt presenting with weakness and decreased ROM both active and passively. Pt issued HEP for AAROM and PROM. Skilled PT needed to progress pt toward her PLOF and toward LTG's set.    Personal Factors and Comorbidities  Comorbidity 3+    Comorbidities  anxiety, R TKA, depression, sleep apnea, arthritis    Examination-Activity Limitations  Sleep;Carry;Lift;Bathing;Hygiene/Grooming;Dressing    Examination-Participation Restrictions  Community Activity;Shop;Laundry;Cleaning;Other    Stability/Clinical Decision Making  Stable/Uncomplicated    Clinical Decision Making  Low    Rehab Potential  Good    PT Frequency  2x / week    PT Duration  6 weeks    PT Treatment/Interventions  ADLs/Self Care Home Management;Cryotherapy;Electrical Stimulation;Ultrasound;Functional mobility training;Therapeutic activities;Therapeutic exercise;Neuromuscular re-education;Patient/family education;Manual techniques;Passive range of motion;Taping    PT Next Visit Plan  PROM, AAROM, modalities, Manual as needed    PT Home  Exercise Plan  Access Code: [email or web address]    Consulted and Agree with Plan of Care  Patient       Patient will benefit from skilled therapeutic intervention in order to improve the  following deficits and impairments:  Pain, Postural dysfunction, Decreased strength, Decreased range of motion, Increased edema, Decreased mobility  Visit Diagnosis: Acute pain of right shoulder  Stiffness of right shoulder, not elsewhere classified  Localized edema     Problem List Patient Active Problem List   Diagnosis Date Noted  . Arthritis of shoulder 03/28/2020  . Primary osteoarthritis, right shoulder 01/11/2020    [name], PT, MPT 04/12/2020, 4:29 PM  [facility] Physical Therapy [address] [place], [place], [address] Phone: [phone]   Fax:  [phone]  Name: Beth [name] MRN: [id number] Date of Birth: Jul 24, 1956

## 2020-04-16 ENCOUNTER — Encounter: Payer: Self-pay | Admitting: Orthopedic Surgery

## 2020-04-16 NOTE — Progress Notes (Signed)
   Post-Op Visit Note   Patient: Angelika Jerrett           Date of Birth: 10-06-1956           MRN: [id number] Visit Date: 04/12/2020 PCP: Sandford Craze, NP   Assessment & Plan:  Chief Complaint:  Chief Complaint  Patient presents with  . Right Shoulder - Routine Post Op   Visit Diagnoses:  1. S/P reverse total shoulder arthroplasty, right     Plan: Patient is a 64 year old female presents s/p right reverse shoulder arthroplasty on 03/28/2020.  Patient is doing well overall.  She only complains of soreness and no significant pain.  Pain does not wake her up at night.  She is compliant with using her sling and is up to 110 degrees on the CPM machine.  She is taking oxycodone only once per day.  On exam her incision is healing well and her axillary nerve is functioning.  She has great subscapularis strength.  She has 90 degrees of abduction greater than 100 degrees of forward flexion.  Plan to prescribe Celebrex and start physical therapy outpatient.  She may discontinue the sling and hold off on lifting anything more than 5 pounds.  Return in 4 weeks for clinical recheck.  Radiographs taken today of the right shoulder show a well aligned right shoulder prosthesis without complicating features.  Follow-Up Instructions: No follow-ups on file.   Orders:  Orders Placed This Encounter  Procedures  . XR Shoulder Right  . Ambulatory referral to Physical Therapy   Meds ordered this encounter  Medications  . celecoxib (CELEBREX) 200 MG capsule    Sig: Take 1 capsule (200 mg total) by mouth daily as needed.    Dispense:  30 capsule    Refill:  0    Imaging: No results found.  PMFS History: Patient Active Problem List   Diagnosis Date Noted  . Arthritis of shoulder 03/28/2020  . Primary osteoarthritis, right shoulder 01/11/2020   Past Medical History:  Diagnosis Date  . Anxiety   . Arthritis   . Depression   . Sleep apnea    does not uses CPAP; uses mouth guard.       Family History  Problem Relation Age of Onset  . Breast cancer Mother   . Diabetes Father   . Hydrocephalus Father     Past Surgical History:  Procedure Laterality Date  . ANKLE SURGERY Right 2008  . BACK SURGERY  2009  . REPLACEMENT TOTAL KNEE Right 2012  . TONSILLECTOMY    . TOTAL SHOULDER ARTHROPLASTY Right 03/28/2020   Procedure: RIGHT SHOULDER REPLACEMENT;  Surgeon: Cammy Copa, MD;  Location: [facility] OR;  Service: Orthopedics;  Laterality: Right;   Social History   Occupational History  . Occupation: retired  Tobacco Use  . Smoking status: Former Smoker    Types: Cigarettes    Quit date: 07/22/1989    Years since quitting: 30.7  . Smokeless tobacco: Never Used  Substance and Sexual Activity  . Alcohol use: Yes    Comment: social  . Drug use: Never  . Sexual activity: Yes    Partners: Male

## 2020-04-17 ENCOUNTER — Other Ambulatory Visit: Payer: Self-pay | Admitting: Surgical

## 2020-04-17 NOTE — Telephone Encounter (Signed)
Ok to rf? 

## 2020-04-18 NOTE — Discharge Summary (Signed)
Physician Discharge Summary      Patient ID: Beth [name] MRN: [id number] DOB/AGE: May 05, 1956 64 y.o.  Admit date: 03/28/2020 Discharge date: 03/29/2020  Admission Diagnoses:  Active Problems:   Arthritis of shoulder   Discharge Diagnoses:  Same  Surgeries: Procedure(s): RIGHT SHOULDER REPLACEMENT on 03/28/2020   Consultants:   Discharged Condition: Stable  Hospital Course: Beth [name] is an 64 y.o. female who was admitted 03/28/2020 with a chief complaint of right shoulder pain, and found to have a diagnosis of right shoulder osteoarthritis.  They were brought to the operating room on 03/28/2020 and underwent the above named procedures.  Pt awoke from anesthesia without complication and was transferred to the floor. On POD1, patient was doing well and her pain was controlled.  Block was still wearing off.  She was able to ambulate without lightheadedness or dizziness.  She was discharged home on postop day 1.  Pt will f/u with Dr. Marlou [name] in clinic in ~2 weeks.   Antibiotics given:  Anti-infectives (From admission, onward)   Start     Dose/Rate Route Frequency Ordered Stop   03/28/20 2100  ceFAZolin (ANCEF) IVPB 2g/100 mL premix     2 g 200 mL/hr over 30 Minutes Intravenous Every 6 hours 03/28/20 1816 03/29/20 0438   03/28/20 1556  vancomycin (VANCOCIN) powder  Status:  Discontinued       As needed 03/28/20 1556 03/28/20 1707   03/28/20 1030  ceFAZolin (ANCEF) IVPB 2g/100 mL premix     2 g 200 mL/hr over 30 Minutes Intravenous On call to O.R. 03/28/20 1020 03/28/20 1310    .  Recent vital signs:  Vitals:   03/29/20 0729 03/29/20 1248  BP: 133/71 129/61  Pulse: (!) 102 89  Resp:  18  Temp: 98.3 F (36.8 C) 98.4 F (36.9 C)  SpO2: 92% 96%    Recent laboratory studies:  Results for orders placed or performed during the hospital encounter of 03/24/20  SARS CORONAVIRUS 2 (TAT 6-24 HRS) Nasopharyngeal Nasopharyngeal Swab   Specimen: Nasopharyngeal Swab  Result Value  Ref Range   SARS Coronavirus 2 NEGATIVE NEGATIVE    Discharge Medications:   Allergies as of 03/29/2020   No Known Allergies     Medication List    STOP taking these medications   amoxicillin 500 MG tablet Commonly known as: AMOXIL   temazepam 7.5 MG capsule Commonly known as: RESTORIL   zolpidem 5 MG tablet Commonly known as: AMBIEN     TAKE these medications   aspirin 81 MG chewable tablet Chew 1 tablet (81 mg total) by mouth daily.   b complex vitamins tablet Take 1 tablet by mouth daily.   BIOTIN PO Take 1 tablet by mouth daily.   CALCIUM PO Take 1 tablet by mouth daily.   Calcium-Magnesium-Zinc Tabs Take 3 tablets by mouth daily.   ferrous sulfate 325 (65 FE) MG tablet Take 325 mg by mouth daily with breakfast.   GLUCOSAMINE-MSM PO Take 2 tablets by mouth daily.   methocarbamol 500 MG tablet Commonly known as: ROBAXIN Take 1 tablet (500 mg total) by mouth every 8 (eight) hours as needed for muscle spasms.   oxyCODONE 5 MG immediate release tablet Commonly known as: Oxy IR/ROXICODONE Take 1 tablet (5 mg total) by mouth every 4 (four) hours as needed for moderate pain (pain score 4-6).   sertraline 100 MG tablet Commonly known as: ZOLOFT Take 1 tablet (100 mg total) by mouth daily.   Vitamin D 50 MCG (2000  UT) Caps Take 2,000 Units by mouth daily.       Diagnostic Studies: DG Shoulder Right Port  Result Date: 03/28/2020 CLINICAL DATA:  Postop EXAM: PORTABLE RIGHT SHOULDER COMPARISON:  01/11/2020 FINDINGS: AC joint is intact. Status post reverse right shoulder replacement with normal alignment. No fracture. Gas within the joint space consistent with recent surgery. IMPRESSION: Status post reverse right shoulder replacement with expected postsurgical change. Electronically Signed   By: Beth [name] M.D.   On: 03/28/2020 18:33    Disposition: Discharge disposition: 01-Home or Self Care       Discharge Instructions    Call MD / Call 911    Complete by: As directed    If you experience chest pain or shortness of breath, CALL 911 and be transported to the hospital emergency room.  If you develope a fever above 101 F, pus (white drainage) or increased drainage or redness at the wound, or calf pain, call your surgeon's office.   Constipation Prevention   Complete by: As directed    Drink plenty of fluids.  Prune juice may be helpful.  You may use a stool softener, such as Colace (over the counter) 100 mg twice a day.  Use MiraLax (over the counter) for constipation as needed.   Diet - low sodium heart healthy   Complete by: As directed    Discharge instructions   Complete by: As directed    You may shower, dressing is waterproof.  Do not bathe or soak the operative shoulder in a tub, pool.  Use the CPM machine 3 times a day for one hour each time.  No lifting with the operative shoulder. Continue use of the sling.  Follow-up with Dr. August [name] in ~2 weeks on your given appointment date.  We will remove your adhesive bandage at that time.   Increase activity slowly as tolerated   Complete by: As directed          Signed: Julieanne Cotton 04/18/2020, 4:15 PM

## 2020-04-20 ENCOUNTER — Encounter: Payer: Self-pay | Admitting: Physical Therapy

## 2020-04-20 ENCOUNTER — Other Ambulatory Visit: Payer: Self-pay

## 2020-04-20 ENCOUNTER — Ambulatory Visit (INDEPENDENT_AMBULATORY_CARE_PROVIDER_SITE_OTHER): Payer: No Typology Code available for payment source | Admitting: Physical Therapy

## 2020-04-20 DIAGNOSIS — R600 Localized edema: Secondary | ICD-10-CM | POA: Diagnosis not present

## 2020-04-20 DIAGNOSIS — M25511 Pain in right shoulder: Secondary | ICD-10-CM | POA: Diagnosis not present

## 2020-04-20 DIAGNOSIS — M25611 Stiffness of right shoulder, not elsewhere classified: Secondary | ICD-10-CM

## 2020-04-20 NOTE — Therapy (Signed)
[facility] Physical Therapy [address] [place], [place], [address] Phone: [phone]   Fax:  [phone]  Physical Therapy Treatment  Patient Details  Name: Beth [name] MRN: [id number] Date of Birth: [birth year] Referring Provider (PT): Rise Paganini MD   Encounter Date: 04/20/2020  PT End of Session - 04/20/20 1523    Visit Number  2    Number of Visits  17    Date for PT Re-Evaluation  05/26/20    PT Start Time  1300    PT Stop Time  1345    PT Time Calculation (min)  45 min    Activity Tolerance  Patient tolerated treatment well    Behavior During Therapy  [facility] for tasks assessed/performed       Past Medical History:  Diagnosis Date  . Anxiety   . Arthritis   . Depression   . Sleep apnea    does not uses CPAP; uses mouth guard.     Past Surgical History:  Procedure Laterality Date  . ANKLE SURGERY Right 2008  . BACK SURGERY  2009  . REPLACEMENT TOTAL KNEE Right 2012  . TONSILLECTOMY    . TOTAL SHOULDER ARTHROPLASTY Right 03/28/2020   Procedure: RIGHT SHOULDER REPLACEMENT;  Surgeon: Cammy Copa, MD;  Location: [facility] OR;  Service: Orthopedics;  Laterality: Right;    There were no vitals filed for this visit.  Subjective Assessment - 04/20/20 1521    Subjective  Pt arrriving to therpay with no pain reported at rest. Pt compliant with her HEP    Pertinent History  arthritis, R TKA, depression, sleep apnea, R reverse total shoulder on 4/202021, peroformed by Dr. August Saucer.    Patient Stated Goals  Get back to lifting and normal ADL's.    Currently in Pain?  No/denies         [facility] PT Assessment - 04/20/20 0001      Assessment   Medical Diagnosis  R reverse shoulder    Referring Provider (PT)  Rise Paganini MD    Onset Date/Surgical Date  03/28/20    Hand Dominance  Right      PROM   PROM Assessment Site  Shoulder    Right/Left Shoulder  Right    Right Shoulder Flexion  140 Degrees    Right Shoulder External Rotation  40 Degrees                     [facility] Adult PT Treatment/Exercise - 04/20/20 0001      Exercises   Exercises  Shoulder      Shoulder Exercises: Supine   External Rotation  AAROM;15 reps    Flexion  AAROM;15 reps      Shoulder Exercises: Seated   Other Seated Exercises  UE ranger in seated for flexion and scaption x 20 reach      Shoulder Exercises: Standing   Other Standing Exercises  wall slides x 20 reps, wall ladder x 5 reps      Shoulder Exercises: Pulleys   Flexion  2 minutes    Scaption  2 minutes      Shoulder Exercises: Stretch   Table Stretch - External Rotation Limitations  5 x holding 10 seconds each      Manual Therapy   Manual Therapy  Passive ROM    Manual therapy comments  15 minutes    Passive ROM  flexion, abd, ER, painful at end range of ER  PT Education - 04/20/20 1522    Education Details  Exercise techniques and form correction as needed.    Person(s) Educated  Patient    Methods  Explanation;Demonstration    Comprehension  Verbalized understanding;Returned demonstration          PT Long Term Goals - 04/20/20 1621      PT LONG TERM GOAL #1   Title  Pt will be indepdent in her HEP and progression.    Status  On-going      PT LONG TERM GOAL #2   Title  Pt will be able to improve her R shoulder flexion to >/= 150 degrees.    Status  On-going      PT LONG TERM GOAL #3   Title  Pt will be albe to improve R shoulder ER to >/= 60 degrees.    Status  On-going      PT LONG TERM GOAL #4   Title  Pt will be able to report completeing her ADL's with pain </= 2/10.    Status  On-going            Plan - 04/20/20 1624    Clinical Impression Statement  Pt presenting today reporting no pain at beginning of session. Pt with increased pain reported with end range of ER during PROM and AAROM. Pt progressing with PROM (flexion 140 degrees, ER: 40 degrees). Continue with skilled PT toward goals set.    Personal Factors and  Comorbidities  Comorbidity 3+    Comorbidities  anxiety, R TKA, depression, sleep apnea, arthritis    Examination-Activity Limitations  Sleep;Carry;Lift;Bathing;Hygiene/Grooming;Dressing    Examination-Participation Restrictions  Community Activity;Shop;Laundry;Cleaning;Other    Stability/Clinical Decision Making  Stable/Uncomplicated    Rehab Potential  Good    PT Frequency  2x / week    PT Duration  6 weeks    PT Treatment/Interventions  ADLs/Self Care Home Management;Cryotherapy;Electrical Stimulation;Ultrasound;Functional mobility training;Therapeutic activities;Therapeutic exercise;Neuromuscular re-education;Patient/family education;Manual techniques;Passive range of motion;Taping;Vasopneumatic Device    PT Next Visit Plan  PROM, AAROM, modalities, STM  as needed    PT Home Exercise Plan  Access Code: [email or web address]    Consulted and Agree with Plan of Care  Patient       Patient will benefit from skilled therapeutic intervention in order to improve the following deficits and impairments:  Pain, Postural dysfunction, Decreased strength, Decreased range of motion, Increased edema, Decreased mobility  Visit Diagnosis: Acute pain of right shoulder  Stiffness of right shoulder, not elsewhere classified  Localized edema     Problem List Patient Active Problem List   Diagnosis Date Noted  . Arthritis of shoulder 03/28/2020  . Primary osteoarthritis, right shoulder 01/11/2020    [name], PT, MPT 04/20/2020, 4:32 PM  [facility] Physical Therapy [address] [place], [place], [address] Phone: [phone]   Fax:  [phone]  Name: Beth [name] MRN: [id number] Date of Birth: May 18, 1956

## 2020-04-25 ENCOUNTER — Encounter: Payer: Self-pay | Admitting: Family

## 2020-04-25 ENCOUNTER — Other Ambulatory Visit: Payer: Self-pay

## 2020-04-25 ENCOUNTER — Ambulatory Visit (INDEPENDENT_AMBULATORY_CARE_PROVIDER_SITE_OTHER): Payer: No Typology Code available for payment source | Admitting: Family

## 2020-04-25 VITALS — BP 131/69 | HR 77 | Temp 97.8°F | Resp 16 | Ht 63.5 in | Wt 238.0 lb

## 2020-04-25 DIAGNOSIS — G4700 Insomnia, unspecified: Secondary | ICD-10-CM | POA: Insufficient documentation

## 2020-04-25 DIAGNOSIS — F32A Depression, unspecified: Secondary | ICD-10-CM

## 2020-04-25 DIAGNOSIS — F329 Major depressive disorder, single episode, unspecified: Secondary | ICD-10-CM

## 2020-04-25 DIAGNOSIS — G2581 Restless legs syndrome: Secondary | ICD-10-CM | POA: Diagnosis not present

## 2020-04-25 MED ORDER — ROPINIROLE HCL 0.5 MG PO TABS: 0.5000 mg | ORAL_TABLET | Freq: Every day | ORAL | 3 refills | Status: DC

## 2020-04-25 MED ORDER — TEMAZEPAM 7.5 MG PO CAPS: 7.5000 mg | ORAL_CAPSULE | Freq: Every evening | ORAL | 2 refills | Status: DC | PRN

## 2020-04-25 NOTE — Patient Instructions (Signed)
Restart Temazepam at bedtime as needed for sleep. Please begin requip nightly 1 hour before bed for restless leg.  Send me an update in a few weeks to let me know if this is helping you.  Complete lab work prior to leaving.

## 2020-04-25 NOTE — Progress Notes (Signed)
Subjective:    Patient ID: Beth [name], female    DOB: May 19, 1956, 64 y.o.   MRN: [id number]  HPI   Patient is a 64 yr old female who presents today to discuss 2 concerns:  1) Insomnia- last visit we tried rx with tempazepam.   2) RLS- reports that it has been going on for a few years.  Interferes with her sleep.  Has tried some yoga stretches, moving her legs to tire them out.  Uses a foot massage that vibrates and helps as well.    3) Depression- reports mood remains stable on zoloft.  Review of Systems See HPI  Past Medical History:  Diagnosis Date  . Anxiety   . Arthritis   . Depression   . Sleep apnea    does not uses CPAP; uses mouth guard.      Social History   Socioeconomic History  . Marital status: Married    Spouse name: Ron  . Number of children: 2  . Years of education: Not on file  . Highest education level: Not on file  Occupational History  . Occupation: retired  Tobacco Use  . Smoking status: Former Smoker    Types: Cigarettes    Quit date: 07/22/1989    Years since quitting: 30.7  . Smokeless tobacco: Never Used  Substance and Sexual Activity  . Alcohol use: Yes    Comment: social  . Drug use: Never  . Sexual activity: Yes    Partners: Male  Other Topics Concern  . Not on file  Social History Narrative   Retired Garment/textile technologist   Divorced (from [place] originally)   Remarried 2015 (husband is a Pharmacist, community)   Has a dog   2 children (son and daughter) both married, daughter lives in [place], son in [place]. No grandchildren   Enjoys baking, sewing, reading   Social Determinants of Health   Financial Resource Strain: Low Risk   . Difficulty of Paying Living Expenses: Not hard at all  Food Insecurity: No Food Insecurity  . Worried About Charity fundraiser in the Last Year: Never true  . Ran Out of Food in the Last Year: Never true  Transportation Needs: Unknown  . Lack of Transportation (Medical): Not on file  . Lack of Transportation  (Non-Medical): No  Physical Activity: Insufficiently Active  . Days of Exercise per Week: 1 day  . Minutes of Exercise per Session: 30 min  Stress:   . Feeling of Stress :   Social Connections: Slightly Isolated  . Frequency of Communication with Friends and Family: More than three times a week  . Frequency of Social Gatherings with Friends and Family: Never  . Attends Religious Services: More than 4 times per year  . Active Member of Clubs or Organizations: No  . Attends Archivist Meetings: Never  . Marital Status: Married  Human resources officer Violence: Not At Risk  . Fear of Current or Ex-Partner: No  . Emotionally Abused: No  . Physically Abused: No  . Sexually Abused: No    Past Surgical History:  Procedure Laterality Date  . ANKLE SURGERY Right 2008  . BACK SURGERY  2009  . REPLACEMENT TOTAL KNEE Right 2012  . TONSILLECTOMY    . TOTAL SHOULDER ARTHROPLASTY Right 03/28/2020   Procedure: RIGHT SHOULDER REPLACEMENT;  Surgeon: Meredith Pel, MD;  Location: [place];  Service: Orthopedics;  Laterality: Right;    Family History  Problem Relation Age of Onset  . Breast  cancer Mother   . Diabetes Father   . Hydrocephalus Father     No Known Allergies  Current Outpatient Medications on File Prior to Visit  Medication Sig Dispense Refill  . b complex vitamins tablet Take 1 tablet by mouth daily.    Marland Kitchen BIOTIN PO Take 1 tablet by mouth daily.    Marland Kitchen CALCIUM PO Take 1 tablet by mouth daily.    . celecoxib (CELEBREX) 200 MG capsule TAKE 1 CAPSULE (200 MG TOTAL) BY MOUTH 2 (TWO) TIMES DAILY. ADDITIONALLY, TAKE 400MG  (2 CAPSULES) ON THE MORNING OF YOUR SURGERY AS SOON AS YOU WAKE UP. TAKE WITH A SMALL SIP OF WATER AND WITH NO FOOD. 60 capsule 0  . Cholecalciferol (VITAMIN D) 50 MCG (2000 UT) CAPS Take 2,000 Units by mouth daily.    . ferrous sulfate 325 (65 FE) MG tablet Take 325 mg by mouth daily with breakfast.    . Glucosamine HCl-MSM (GLUCOSAMINE-MSM PO) Take 2 tablets  by mouth daily.    . Multiple Minerals (CALCIUM-MAGNESIUM-ZINC) TABS Take 3 tablets by mouth daily.    . sertraline (ZOLOFT) 100 MG tablet Take 1 tablet (100 mg total) by mouth daily. 90 tablet 1   No current facility-administered medications on file prior to visit.    BP 131/69 (BP Location: Right Arm, Patient Position: Sitting, Cuff Size: Large)   Pulse 77   Temp 97.8 F (36.6 C) (Temporal)   Resp 16   Ht 5' 3.5" (1.613 m)   Wt 238 lb (108 kg)   SpO2 98%   BMI 41.50 kg/m       Objective:   Physical Exam Constitutional:      Appearance: She is well-developed.  Cardiovascular:     Rate and Rhythm: Normal rate and regular rhythm.     Heart sounds: Normal heart sounds. No murmur.  Pulmonary:     Effort: Pulmonary effort is normal. No respiratory distress.     Breath sounds: Normal breath sounds. No wheezing.  Psychiatric:        Behavior: Behavior normal.        Thought Content: Thought content normal.        Judgment: Judgment normal.           Assessment & Plan:  Depression- stable on zoloft. Continue same.  RLS- uncontrolled. Will give a trial of HS requip.  Insomnia- had good response to temazepam. Will restart. Reviewed PDMP aware, controlled substance contract is signed and UDS will be obtained.  This visit occurred during the SARS-CoV-2 public health emergency.  Safety protocols were in place, including screening questions prior to the visit, additional usage of staff PPE, and extensive cleaning of exam room while observing appropriate contact time as indicated for disinfecting solutions.

## 2020-04-26 ENCOUNTER — Ambulatory Visit (INDEPENDENT_AMBULATORY_CARE_PROVIDER_SITE_OTHER): Payer: No Typology Code available for payment source | Admitting: Physical Therapy

## 2020-04-26 ENCOUNTER — Encounter: Payer: Self-pay | Admitting: Physical Therapy

## 2020-04-26 ENCOUNTER — Other Ambulatory Visit: Payer: Self-pay

## 2020-04-26 DIAGNOSIS — R600 Localized edema: Secondary | ICD-10-CM

## 2020-04-26 DIAGNOSIS — M25511 Pain in right shoulder: Secondary | ICD-10-CM

## 2020-04-26 DIAGNOSIS — M25611 Stiffness of right shoulder, not elsewhere classified: Secondary | ICD-10-CM | POA: Diagnosis not present

## 2020-04-26 LAB — DRUG MONITORING, PANEL 8 WITH CONFIRMATION, URINE
6 Acetylmorphine: NEGATIVE ng/mL (ref <10)
Alcohol Metabolites: NEGATIVE ng/mL
Amphetamines: NEGATIVE ng/mL (ref <500)
Benzodiazepines: NEGATIVE ng/mL (ref <100)
Buprenorphine, Urine: NEGATIVE ng/mL (ref <5)
Cocaine Metabolite: NEGATIVE ng/mL (ref <150)
Creatinine: 72.5 mg/dL
MDMA: NEGATIVE ng/mL (ref <500)
Marijuana Metabolite: NEGATIVE ng/mL (ref <20)
Opiates: NEGATIVE ng/mL (ref <100)
Oxidant: NEGATIVE ug/mL
Oxycodone: NEGATIVE ng/mL (ref <100)
pH: 7.3 (ref 4.5–9.0)

## 2020-04-26 LAB — DM TEMPLATE

## 2020-04-26 NOTE — Therapy (Signed)
[facility] Physical Therapy [address] [place], [place], [address] Phone: [phone]   Fax:  [phone]  Physical Therapy Treatment  Patient Details  Name: Beth [name] MRN: [id number] Date of Birth: Apr 20, 1956 Referring Provider (PT): Rise Paganini MD   Encounter Date: 04/26/2020  PT End of Session - 04/26/20 1628    Visit Number  3    Number of Visits  17    Date for PT Re-Evaluation  05/26/20    PT Start Time  1515    PT Stop Time  1605    PT Time Calculation (min)  50 min    Activity Tolerance  Patient tolerated treatment well    Behavior During Therapy  [facility] for tasks assessed/performed       Past Medical History:  Diagnosis Date  . Anxiety   . Arthritis   . Depression   . Sleep apnea    does not uses CPAP; uses mouth guard.     Past Surgical History:  Procedure Laterality Date  . ANKLE SURGERY Right 2008  . BACK SURGERY  2009  . REPLACEMENT TOTAL KNEE Right 2012  . TONSILLECTOMY    . TOTAL SHOULDER ARTHROPLASTY Right 03/28/2020   Procedure: RIGHT SHOULDER REPLACEMENT;  Surgeon: Cammy Copa, MD;  Location: [facility] OR;  Service: Orthopedics;  Laterality: Right;    There were no vitals filed for this visit.  Subjective Assessment - 04/26/20 1628    Subjective  Pt arriving to therpay with no pain reported. Pt reports her CPM was picked up this week.    Pertinent History  arthritis, R TKA, depression, sleep apnea, R reverse total shoulder on 4/202021, peroformed by Dr. August Saucer.    Patient Stated Goals  Get back to lifting and normal ADL's.    Currently in Pain?  No/denies         [facility] PT Assessment - 04/26/20 0001      Assessment   Medical Diagnosis  R reverse shoulder    Referring Provider (PT)  Rise Paganini MD    Onset Date/Surgical Date  03/28/20    Hand Dominance  Right      PROM   PROM Assessment Site  Shoulder    Right/Left Shoulder  Right    Right Shoulder Flexion  140 Degrees    Right Shoulder External Rotation  40  Degrees                    [facility] Adult PT Treatment/Exercise - 04/26/20 0001      Exercises   Exercises  Shoulder      Shoulder Exercises: Supine   External Rotation  AAROM;15 reps    Flexion  AAROM;15 reps      Shoulder Exercises: Seated   Other Seated Exercises  UE ranger in standing flexion x 1 minute      Shoulder Exercises: Pulleys   Flexion  2 minutes    Scaption  2 minutes      Shoulder Exercises: Stretch   Table Stretch - Flexion  5 reps;10 seconds    Table Stretch - External Rotation Limitations  5 x holding 10 seconds each      Manual Therapy   Manual Therapy  Passive ROM    Manual therapy comments  15 minutes    Passive ROM  Flexion/ abd/ ER, pt with tenderness over incision site, STM performed and cross friction over incision line  PT Long Term Goals - 04/26/20 1631      PT LONG TERM GOAL #1   Title  Pt will be indepdent in her HEP and progression.    Time  6    Period  Weeks    Status  On-going      PT LONG TERM GOAL #2   Title  Pt will be able to improve her R shoulder flexion to >/= 150 degrees.    Baseline  see flowsheets    Status  On-going      PT LONG TERM GOAL #3   Title  Pt will be albe to improve R shoulder ER to >/= 60 degrees.    Baseline  see flowsheets    Status  On-going      PT LONG TERM GOAL #4   Title  Pt will be able to report completeing her ADL's with pain </= 2/10.    Status  On-going            Plan - 04/26/20 1629    Clinical Impression Statement  Pt presenting with no pain. Pt continuing to tolreate her exericses well and making progress with PROM to 140 degrees flexion and 40 degrees ER. Pt compliant with her HEP. Recommend continued skilled PT.    Personal Factors and Comorbidities  Comorbidity 3+    Comorbidities  anxiety, R TKA, depression, sleep apnea, arthritis    Examination-Activity Limitations  Sleep;Carry;Lift;Bathing;Hygiene/Grooming;Dressing     Examination-Participation Restrictions  Community Activity;Shop;Laundry;Cleaning;Other    Stability/Clinical Decision Making  Stable/Uncomplicated    Rehab Potential  Good    PT Frequency  2x / week    PT Duration  6 weeks    PT Treatment/Interventions  ADLs/Self Care Home Management;Cryotherapy;Electrical Stimulation;Ultrasound;Functional mobility training;Therapeutic activities;Therapeutic exercise;Neuromuscular re-education;Patient/family education;Manual techniques;Passive range of motion;Taping;Vasopneumatic Device    PT Next Visit Plan  PROM, AAROM, modalities, STM  as needed    PT Home Exercise Plan  Access Code: [email or web address]    Consulted and Agree with Plan of Care  Patient       Patient will benefit from skilled therapeutic intervention in order to improve the following deficits and impairments:  Pain, Postural dysfunction, Decreased strength, Decreased range of motion, Increased edema, Decreased mobility  Visit Diagnosis: Acute pain of right shoulder  Stiffness of right shoulder, not elsewhere classified  Localized edema     Problem List Patient Active Problem List   Diagnosis Date Noted  . Insomnia 04/25/2020  . RLS (restless legs syndrome) 04/25/2020  . Depression 04/25/2020  . Arthritis of shoulder 03/28/2020  . Primary osteoarthritis, right shoulder 01/11/2020    Oretha [name], PT, MPT 04/26/2020, 4:35 PM  [facility] Physical Therapy [address] [place], [place], [address] Phone: [phone]   Fax:  [phone]  Name: Beth [name] MRN: [id number] Date of Birth: [birth year]

## 2020-04-28 ENCOUNTER — Encounter: Payer: Self-pay | Admitting: Physical Therapy

## 2020-04-28 ENCOUNTER — Ambulatory Visit (INDEPENDENT_AMBULATORY_CARE_PROVIDER_SITE_OTHER): Payer: No Typology Code available for payment source | Admitting: Physical Therapy

## 2020-04-28 ENCOUNTER — Other Ambulatory Visit: Payer: Self-pay

## 2020-04-28 DIAGNOSIS — R600 Localized edema: Secondary | ICD-10-CM | POA: Diagnosis not present

## 2020-04-28 DIAGNOSIS — M25511 Pain in right shoulder: Secondary | ICD-10-CM

## 2020-04-28 DIAGNOSIS — M25611 Stiffness of right shoulder, not elsewhere classified: Secondary | ICD-10-CM

## 2020-04-28 NOTE — Therapy (Signed)
[facility] Physical Therapy [address] [place], [place], [address] Phone: [phone]   Fax:  [phone]  Physical Therapy Treatment  Patient Details  Name: Beth [name] MRN: [id number] Date of Birth: 1956-10-16 Referring Provider (PT): Marcene Duos MD   Encounter Date: 04/28/2020  PT End of Session - 04/28/20 1301    Visit Number  4    Number of Visits  17    Date for PT Re-Evaluation  05/26/20    PT Start Time  1301    PT Stop Time  1345    PT Time Calculation (min)  44 min    Activity Tolerance  Patient tolerated treatment well    Behavior During Therapy  [facility] for tasks assessed/performed       Past Medical History:  Diagnosis Date  . Anxiety   . Arthritis   . Depression   . Sleep apnea    does not uses CPAP; uses mouth guard.     Past Surgical History:  Procedure Laterality Date  . ANKLE SURGERY Right 2008  . BACK SURGERY  2009  . REPLACEMENT TOTAL KNEE Right 2012  . TONSILLECTOMY    . TOTAL SHOULDER ARTHROPLASTY Right 03/28/2020   Procedure: RIGHT SHOULDER REPLACEMENT;  Surgeon: Meredith Pel, MD;  Location: [place];  Service: Orthopedics;  Laterality: Right;    There were no vitals filed for this visit.  Subjective Assessment - 04/28/20 1301    Subjective  "no pain or problems"    Currently in Pain?  No/denies    Pain Score  0-No pain         [facility] PT Assessment - 04/28/20 0001      Assessment   Medical Diagnosis  R reverse shoulder    Referring Provider (PT)  Marcene Duos MD                    [facility] Adult PT Treatment/Exercise - 04/28/20 0001      Exercises   Exercises  Wrist      Elbow Exercises   Elbow Flexion  Seated;10 reps   elbow flexion 3#     Shoulder Exercises: Supine   External Rotation  AAROM;15 reps    Flexion  AAROM;15 reps      Shoulder Exercises: Standing   Other Standing Exercises  wall ladder with eccentric control lowering 1 x 8 flexion, 1 x 8 scaption angle.       Shoulder  Exercises: Pulleys   Flexion  2 minutes    Scaption  2 minutes      Shoulder Exercises: Isometric Strengthening   ABduction  5X5"      Wrist Exercises   Wrist Flexion  Strengthening;15 reps;Bar weights/barbell    Bar Weights/Barbell (Wrist Flexion)  2 lbs    Wrist Extension  Strengthening;Right;15 reps;Seated;Bar weights/barbell    Bar Weights/Barbell (Wrist Extension)  2 lbs      Modalities   Modalities  Vasopneumatic      Vasopneumatic   Number Minutes Vasopneumatic   10 minutes    Vasopnuematic Location   Shoulder    Vasopneumatic Pressure  Medium    Vasopneumatic Temperature   34      Manual Therapy   Manual therapy comments  MTRP along the pec major/ minor x1 ea.    Passive ROM  Flexion/ abd/ ER working into available ROM                  PT Long Term Goals - 04/26/20  1631      PT LONG TERM GOAL #1   Title  Pt will be indepdent in her HEP and progression.    Time  6    Period  Weeks    Status  On-going      PT LONG TERM GOAL #2   Title  Pt will be able to improve her R shoulder flexion to >/= 150 degrees.    Baseline  see flowsheets    Status  On-going      PT LONG TERM GOAL #3   Title  Pt will be albe to improve R shoulder ER to >/= 60 degrees.    Baseline  see flowsheets    Status  On-going      PT LONG TERM GOAL #4   Title  Pt will be able to report completeing her ADL's with pain </= 2/10.    Status  On-going            Plan - 04/28/20 1338    Clinical Impression Statement  pt continues to note no pain. Continued working PROM working to end range followed with AAROM for the R shoulder. Worked on wrist and elbow strengthening which she responded well to. utilized game ready end of session to calm down soreness.    PT Treatment/Interventions  ADLs/Self Care Home Management;Cryotherapy;Electrical Stimulation;Ultrasound;Functional mobility training;Therapeutic activities;Therapeutic exercise;Neuromuscular re-education;Patient/family  education;Manual techniques;Passive range of motion;Taping;Vasopneumatic Device    PT Next Visit Plan  PROM, AAROM, modalities, STM  as needed pt is 4 weeks post op on 04/25/2020    Consulted and Agree with Plan of Care  Patient       Patient will benefit from skilled therapeutic intervention in order to improve the following deficits and impairments:  Pain, Postural dysfunction, Decreased strength, Decreased range of motion, Increased edema, Decreased mobility  Visit Diagnosis: Acute pain of right shoulder  Stiffness of right shoulder, not elsewhere classified  Localized edema     Problem List Patient Active Problem List   Diagnosis Date Noted  . Insomnia 04/25/2020  . RLS (restless legs syndrome) 04/25/2020  . Depression 04/25/2020  . Arthritis of shoulder 03/28/2020  . Primary osteoarthritis, right shoulder 01/11/2020    Lulu Riding PT, DPT, LAT, ATC  04/28/20  1:44 PM      [facility] Physical Therapy [address] [place], [place], [address] Phone: [phone]   Fax:  [phone]  Name: Beth [name] MRN: [id number] Date of Birth: Apr 23, 1956

## 2020-05-01 ENCOUNTER — Other Ambulatory Visit: Payer: Self-pay

## 2020-05-01 ENCOUNTER — Encounter: Payer: Self-pay | Admitting: Physical Therapy

## 2020-05-01 ENCOUNTER — Ambulatory Visit (INDEPENDENT_AMBULATORY_CARE_PROVIDER_SITE_OTHER): Payer: No Typology Code available for payment source | Admitting: Physical Therapy

## 2020-05-01 DIAGNOSIS — M25611 Stiffness of right shoulder, not elsewhere classified: Secondary | ICD-10-CM

## 2020-05-01 DIAGNOSIS — M25511 Pain in right shoulder: Secondary | ICD-10-CM | POA: Diagnosis not present

## 2020-05-01 DIAGNOSIS — R600 Localized edema: Secondary | ICD-10-CM | POA: Diagnosis not present

## 2020-05-01 NOTE — Therapy (Signed)
[facility] [address] [place], [place], [address] Phone: [phone]   Fax:  [phone]  Physical Therapy Treatment  Patient Details  Name: Beth [name] MRN: [id number] Date of Birth: 01-19-1956 Referring Provider (PT): Rise Paganini MD   Encounter Date: 05/01/2020  PT End of Session - 05/01/20 0922    Visit Number  5    Number of Visits  17    Date for PT Re-Evaluation  05/26/20    PT Start Time  0840    PT Stop Time  0930    PT Time Calculation (min)  50 min    Activity Tolerance  Patient tolerated treatment well    Behavior During Therapy  [facility] for tasks assessed/performed       Past Medical History:  Diagnosis Date  . Anxiety   . Arthritis   . Depression   . Sleep apnea    does not uses CPAP; uses mouth guard.     Past Surgical History:  Procedure Laterality Date  . ANKLE SURGERY Right 2008  . BACK SURGERY  2009  . REPLACEMENT TOTAL KNEE Right 2012  . TONSILLECTOMY    . TOTAL SHOULDER ARTHROPLASTY Right 03/28/2020   Procedure: RIGHT SHOULDER REPLACEMENT;  Surgeon: Cammy Copa, MD;  Location: [facility] OR;  Service: Orthopedics;  Laterality: Right;    There were no vitals filed for this visit.  Subjective Assessment - 05/01/20 0840    Subjective  no complaints today, has some mild soreness but "nothing major"    Patient Stated Goals  Get back to lifting and normal ADL's.    Currently in Pain?  No/denies                        [facility] Adult PT Treatment/Exercise - 05/01/20 0843      Shoulder Exercises: Supine   External Rotation  AAROM;20 reps   2# bar   Flexion  AAROM;20 reps   2# bar     Shoulder Exercises: Standing   Extension  20 reps;Theraband    Theraband Level (Shoulder Extension)  Level 3 (Green)    Row  [employer];Theraband    Theraband Level (Shoulder Row)  Level 3 (Green)    Other Standing Exercises  wall ladder with eccentric control lowering 1 x 10 flexion, 1 x 10 scaption angle.        Shoulder Exercises: Pulleys   Flexion  2 minutes    Scaption  2 minutes      Shoulder Exercises: ROM/Strengthening   Proximal Shoulder Strengthening, Supine  Rt 1#, circles x 20, A-Z      Vasopneumatic   Number Minutes Vasopneumatic   10 minutes    Vasopnuematic Location   Shoulder    Vasopneumatic Pressure  Medium    Vasopneumatic Temperature   34      Manual Therapy   Passive ROM  Flexion/ abd/ ER working into available ROM                  PT Long Term Goals - 04/26/20 1631      PT LONG TERM GOAL #1   Title  Pt will be indepdent in her HEP and progression.    Time  6    Period  Weeks    Status  On-going      PT LONG TERM GOAL #2   Title  Pt will be able to improve her R shoulder flexion to >/= 150 degrees.  Baseline  see flowsheets    Status  On-going      PT LONG TERM GOAL #3   Title  Pt will be albe to improve R shoulder ER to >/= 60 degrees.    Baseline  see flowsheets    Status  On-going      PT LONG TERM GOAL #4   Title  Pt will be able to report completeing her ADL's with pain </= 2/10.    Status  On-going            Plan - 05/01/20 4132    Clinical Impression Statement  Pt tolerated session well with expected fatigue noted with AAROM exercises.  Will continue to benefit from PT to maximize function.    PT Treatment/Interventions  ADLs/Self Care Home Management;Cryotherapy;Electrical Stimulation;Ultrasound;Functional mobility training;Therapeutic activities;Therapeutic exercise;Neuromuscular re-education;Patient/family education;Manual techniques;Passive range of motion;Taping;Vasopneumatic Device    PT Next Visit Plan  PROM, AAROM, modalities, STM  PRN    Consulted and Agree with Plan of Care  Patient       Patient will benefit from skilled therapeutic intervention in order to improve the following deficits and impairments:  Pain, Postural dysfunction, Decreased strength, Decreased range of motion, Increased edema, Decreased  mobility  Visit Diagnosis: Acute pain of right shoulder  Stiffness of right shoulder, not elsewhere classified  Localized edema     Problem List Patient Active Problem List   Diagnosis Date Noted  . Insomnia 04/25/2020  . RLS (restless legs syndrome) 04/25/2020  . Depression 04/25/2020  . Arthritis of shoulder 03/28/2020  . Primary osteoarthritis, right shoulder 01/11/2020      Laureen Abrahams, PT, DPT 05/01/20 9:24 AM     [facility] [address] [place], [place], [address] Phone: [phone]   Fax:  [phone]  Name: Beth [name] MRN: [id number] Date of Birth: 01/13/1956

## 2020-05-04 ENCOUNTER — Encounter: Payer: Self-pay | Admitting: Family

## 2020-05-04 ENCOUNTER — Encounter: Payer: Self-pay | Admitting: Rehabilitative and Restorative Service Providers"

## 2020-05-04 ENCOUNTER — Other Ambulatory Visit: Payer: Self-pay

## 2020-05-04 ENCOUNTER — Telehealth: Payer: Self-pay | Admitting: Physical Therapy

## 2020-05-04 ENCOUNTER — Ambulatory Visit (INDEPENDENT_AMBULATORY_CARE_PROVIDER_SITE_OTHER): Payer: No Typology Code available for payment source | Admitting: Rehabilitative and Restorative Service Providers"

## 2020-05-04 DIAGNOSIS — M25511 Pain in right shoulder: Secondary | ICD-10-CM

## 2020-05-04 DIAGNOSIS — R600 Localized edema: Secondary | ICD-10-CM | POA: Diagnosis not present

## 2020-05-04 DIAGNOSIS — M25611 Stiffness of right shoulder, not elsewhere classified: Secondary | ICD-10-CM

## 2020-05-04 NOTE — Telephone Encounter (Signed)
Called pt as she NS for 10:15 appt today.  She reports she thought appt was at 10:45.  Rescheduled for 1:45 today.  [name], PT, DPT 05/04/20 10:36 AM

## 2020-05-04 NOTE — Therapy (Signed)
[facility] [address] [place], [place], [address] Phone: [phone]   Fax:  [phone]  Physical Therapy Treatment  Patient Details  Name: Beth [name] MRN: [id number] Date of Birth: 1956/01/06 Referring Provider (PT): Rise Paganini MD   Encounter Date: 05/04/2020  PT End of Session - 05/04/20 1409    Visit Number  6    Number of Visits  17    Date for PT Re-Evaluation  05/26/20    PT Start Time  1342    PT Stop Time  1430    PT Time Calculation (min)  48 min    Activity Tolerance  Patient tolerated treatment well    Behavior During Therapy  [facility] for tasks assessed/performed       Past Medical History:  Diagnosis Date  . Anxiety   . Arthritis   . Depression   . Sleep apnea    does not uses CPAP; uses mouth guard.     Past Surgical History:  Procedure Laterality Date  . ANKLE SURGERY Right 2008  . BACK SURGERY  2009  . REPLACEMENT TOTAL KNEE Right 2012  . TONSILLECTOMY    . TOTAL SHOULDER ARTHROPLASTY Right 03/28/2020   Procedure: RIGHT SHOULDER REPLACEMENT;  Surgeon: Cammy Copa, MD;  Location: [facility] OR;  Service: Orthopedics;  Laterality: Right;    There were no vitals filed for this visit.  Subjective Assessment - 05/04/20 1409    Subjective  Pt. indicated    Patient Stated Goals  Get back to lifting and normal ADL's.                        [facility] Adult PT Treatment/Exercise - 05/04/20 0001      Neuro Re-ed    Neuro Re-ed Details   supine 100 deg flexion stabilizations c mild to moderate resistance 30 sec bouts      Shoulder Exercises: Supine   Other Supine Exercises  supine flexion 1 lb 3 x 15    Other Supine Exercises  supine 90 deg flexion circles cw, ccw 2 lbs 30 x 2 each      Shoulder Exercises: Standing   Extension  Strengthening;Both;Other (comment)   3 x 10   Theraband Level (Shoulder Extension)  Level 3 (Green)    Row  Strengthening;Both;Other (comment)   3 x 10   Theraband Level  (Shoulder Row)  Level 3 (Green)    Other Standing Exercises  flexion 2 x 10, scaption 2 x 10 1 lb       Shoulder Exercises: Pulleys   Flexion  2 minutes    Scaption  2 minutes      Shoulder Exercises: ROM/Strengthening   UBE (Upper Arm Bike)  2 mins fwd/back lvl 2 each      Vasopneumatic   Number Minutes Vasopneumatic   10 minutes    Vasopnuematic Location   Shoulder    Vasopneumatic Pressure  Medium    Vasopneumatic Temperature   34      Manual Therapy   Manual therapy comments  g3 superior jt mobs, contract/relax IR mobility                  PT Long Term Goals - 04/26/20 1631      PT LONG TERM GOAL #1   Title  Pt will be indepdent in her HEP and progression.    Time  6    Period  Weeks    Status  On-going  PT LONG TERM GOAL #2   Title  Pt will be able to improve her R shoulder flexion to >/= 150 degrees.    Baseline  see flowsheets    Status  On-going      PT LONG TERM GOAL #3   Title  Pt will be albe to improve R shoulder ER to >/= 60 degrees.    Baseline  see flowsheets    Status  On-going      PT LONG TERM GOAL #4   Title  Pt will be able to report completeing her ADL's with pain </= 2/10.    Status  On-going            Plan - 05/04/20 1415    Clinical Impression Statement  Good progression in arom activity c mild resistances applied.  IR mobility limited overall.    Personal Factors and Comorbidities  Comorbidity 3+    Comorbidities  anxiety, R TKA, depression, sleep apnea, arthritis    Examination-Activity Limitations  Sleep;Carry;Lift;Bathing;Hygiene/Grooming;Dressing    Examination-Participation Restrictions  Community Activity;Shop;Laundry;Cleaning;Other    Stability/Clinical Decision Making  Stable/Uncomplicated    Rehab Potential  Good    PT Frequency  2x / week    PT Duration  6 weeks    PT Treatment/Interventions  ADLs/Self Care Home Management;Cryotherapy;Electrical Stimulation;Ultrasound;Functional mobility  training;Therapeutic activities;Therapeutic exercise;Neuromuscular re-education;Patient/family education;Manual techniques;Passive range of motion;Taping;Vasopneumatic Device    PT Next Visit Plan  MD visit after next visit, objective data gathering recommended    PT Home Exercise Plan  Access Code: [email or web address]    Consulted and Agree with Plan of Care  Patient       Patient will benefit from skilled therapeutic intervention in order to improve the following deficits and impairments:  Pain, Postural dysfunction, Decreased strength, Decreased range of motion, Increased edema, Decreased mobility  Visit Diagnosis: Acute pain of right shoulder  Stiffness of right shoulder, not elsewhere classified  Localized edema     Problem List Patient Active Problem List   Diagnosis Date Noted  . Insomnia 04/25/2020  . RLS (restless legs syndrome) 04/25/2020  . Depression 04/25/2020  . Arthritis of shoulder 03/28/2020  . Primary osteoarthritis, right shoulder 01/11/2020   Scot Jun, PT, DPT, OCS, ATC 05/04/20  2:20 PM    [facility] [address] [place], [place], [address] Phone: [phone]   Fax:  [phone]  Name: Beth [name] MRN: [id number] Date of Birth: 1956-04-19

## 2020-05-05 MED ORDER — CLONAZEPAM 0.5 MG PO TABS: 0.5000 mg | ORAL_TABLET | Freq: Every evening | ORAL | 0 refills | Status: DC | PRN

## 2020-05-09 ENCOUNTER — Encounter: Payer: Self-pay | Admitting: Physical Therapy

## 2020-05-09 ENCOUNTER — Ambulatory Visit (INDEPENDENT_AMBULATORY_CARE_PROVIDER_SITE_OTHER): Payer: No Typology Code available for payment source | Admitting: Physical Therapy

## 2020-05-09 ENCOUNTER — Other Ambulatory Visit: Payer: Self-pay

## 2020-05-09 DIAGNOSIS — M25611 Stiffness of right shoulder, not elsewhere classified: Secondary | ICD-10-CM | POA: Diagnosis not present

## 2020-05-09 DIAGNOSIS — M25511 Pain in right shoulder: Secondary | ICD-10-CM

## 2020-05-09 DIAGNOSIS — R600 Localized edema: Secondary | ICD-10-CM

## 2020-05-09 NOTE — Therapy (Signed)
[facility] [address] [place], [place], [address] Phone: [phone]   Fax:  [phone]  Physical Therapy Treatment  Patient Details  Name: Beth [name] MRN: [id number] Date of Birth: 12/09/1956 Referring Provider (PT): Marcene Duos MD   Encounter Date: 05/09/2020  PT End of Session - 05/09/20 1227    Visit Number  7    Number of Visits  17    Date for PT Re-Evaluation  05/26/20    PT Start Time  1143    PT Stop Time  1235    PT Time Calculation (min)  52 min    Activity Tolerance  Patient tolerated treatment well    Behavior During Therapy  [facility] for tasks assessed/performed       Past Medical History:  Diagnosis Date  . Anxiety   . Arthritis   . Depression   . Sleep apnea    does not uses CPAP; uses mouth guard.     Past Surgical History:  Procedure Laterality Date  . ANKLE SURGERY Right 2008  . BACK SURGERY  2009  . REPLACEMENT TOTAL KNEE Right 2012  . TONSILLECTOMY    . TOTAL SHOULDER ARTHROPLASTY Right 03/28/2020   Procedure: RIGHT SHOULDER REPLACEMENT;  Surgeon: Meredith Pel, MD;  Location: [place];  Service: Orthopedics;  Laterality: Right;    There were no vitals filed for this visit.  Subjective Assessment - 05/09/20 1144    Subjective  had to ice over the weekend - painted her front porch this weekend.    Patient Stated Goals  Get back to lifting and normal ADL's.    Currently in Pain?  No/denies         [facility] PT Assessment - 05/09/20 1146      Assessment   Medical Diagnosis  R reverse shoulder    Referring Provider (PT)  Marcene Duos MD    Onset Date/Surgical Date  03/28/20      AROM   Right Shoulder Flexion  155 Degrees   144 sitting   Right Shoulder ABduction  165 Degrees   143 sitting   Right Shoulder Internal Rotation  67 Degrees   Thumb to R glute   Right Shoulder External Rotation  60 Degrees   Rt hand to C7/T1                   [facility] Adult PT Treatment/Exercise - 05/09/20  1145      Shoulder Exercises: Supine   Flexion  Right;Weights   3x15   Shoulder Flexion Weight (lbs)  1      Shoulder Exercises: Standing   Extension  Strengthening;Both;Other (comment)   3 x 10   Theraband Level (Shoulder Extension)  Level 3 (Green)    Row  Strengthening;Both;Other (comment)   3 x 10   Theraband Level (Shoulder Row)  Level 3 (Green)      Shoulder Exercises: Pulleys   Flexion  2 minutes    Scaption  2 minutes      Shoulder Exercises: ROM/Strengthening   UBE (Upper Arm Bike)  L4 x 6 min (3' each direction)    Proximal Shoulder Strengthening, Supine  Rt 1#, circles x 20, A-Z      Vasopneumatic   Number Minutes Vasopneumatic   10 minutes    Vasopnuematic Location   Shoulder    Vasopneumatic Pressure  Medium    Vasopneumatic Temperature   34  PT Long Term Goals - 05/09/20 1229      PT LONG TERM GOAL #1   Title  Pt will be indepdent in her HEP and progression.    Time  6    Period  Weeks    Status  On-going    Target Date  05/26/20      PT LONG TERM GOAL #2   Title  Pt will be able to improve her R shoulder flexion to >/= 150 degrees.    Baseline  see flowsheets    Status  Achieved      PT LONG TERM GOAL #3   Title  Pt will be albe to improve R shoulder ER to >/= 60 degrees.    Baseline  see flowsheets    Status  Achieved      PT LONG TERM GOAL #4   Title  Pt will be able to report completeing her ADL's with pain </= 2/10.    Status  On-going    Target Date  05/26/20            Plan - 05/09/20 1229    Clinical Impression Statement  Pt has met ROM LTGs at this time and is progressing well with PT.  Still limited with internal rotation as expected.  To MD tomorrow and will plan to progress strengthening as able.  Will continue to benefit from PT to maximize function.    Personal Factors and Comorbidities  Comorbidity 3+    Comorbidities  anxiety, R TKA, depression, sleep apnea, arthritis    Examination-Activity  Limitations  Sleep;Carry;Lift;Bathing;Hygiene/Grooming;Dressing    Examination-Participation Restrictions  Community Activity;Shop;Laundry;Cleaning;Other    Stability/Clinical Decision Making  Stable/Uncomplicated    Rehab Potential  Good    PT Frequency  2x / week    PT Duration  6 weeks    PT Treatment/Interventions  ADLs/Self Care Home Management;Cryotherapy;Electrical Stimulation;Ultrasound;Functional mobility training;Therapeutic activities;Therapeutic exercise;Neuromuscular re-education;Patient/family education;Manual techniques;Passive range of motion;Taping;Vasopneumatic Device    PT Next Visit Plan  see what MD says, progress as able    PT Home Exercise Plan  Access Code: [email or web address]    Consulted and Agree with Plan of Care  Patient       Patient will benefit from skilled therapeutic intervention in order to improve the following deficits and impairments:  Pain, Postural dysfunction, Decreased strength, Decreased range of motion, Increased edema, Decreased mobility  Visit Diagnosis: Acute pain of right shoulder  Stiffness of right shoulder, not elsewhere classified  Localized edema     Problem List Patient Active Problem List   Diagnosis Date Noted  . Insomnia 04/25/2020  . RLS (restless legs syndrome) 04/25/2020  . Depression 04/25/2020  . Arthritis of shoulder 03/28/2020  . Primary osteoarthritis, right shoulder 01/11/2020      Laureen Abrahams, PT, DPT 05/09/20 12:31 PM     [facility] [address] [place], [place], [address] Phone: [phone]   Fax:  [phone]  Name: Beth [name] MRN: [id number] Date of Birth: 12-Oct-1956

## 2020-05-10 ENCOUNTER — Ambulatory Visit (INDEPENDENT_AMBULATORY_CARE_PROVIDER_SITE_OTHER): Payer: No Typology Code available for payment source | Admitting: Orthopedic Surgery

## 2020-05-10 ENCOUNTER — Encounter: Payer: Self-pay | Admitting: Orthopedic Surgery

## 2020-05-10 DIAGNOSIS — Z96611 Presence of right artificial shoulder joint: Secondary | ICD-10-CM

## 2020-05-10 NOTE — Progress Notes (Signed)
   Post-Op Visit Note   Patient: Beth [name]           Date of Birth: [birth year]           MRN: [id number] Visit Date: 05/10/2020 PCP: Sandford Craze, NP   Assessment & Plan:  Chief Complaint:  Chief Complaint  Patient presents with  . Right Shoulder - Follow-up   Visit Diagnoses:  1. S/P reverse total shoulder arthroplasty, right     Plan: Patient is a 64 year old female presents s/p right shoulder reverse replacement on 03/28/2020.  Patient notes that she is doing much better since her last office visit.  She reports 0 problems.  She has no concerns today.  She has no shoulder pain at night.  She is going to therapy 2 times a week.  Pain does not wake her up at night.  She notes that range of motion continues to improve.  Her incision is healing well and she has excellent subscapularis strength.  On exam she has 35 degrees of external rotation, 140 degrees of forward flexion, 90 degrees of abduction.  She wants to be able to donate platelets which she normally does every 2 weeks.  Recommended the patient hold off until 3 months out from procedure and that she may return to donating platelets.  Follow-up in 6 weeks for clinical recheck.  Follow-Up Instructions: No follow-ups on file.   Orders:  No orders of the defined types were placed in this encounter.  No orders of the defined types were placed in this encounter.   Imaging: No results found.  PMFS History: Patient Active Problem List   Diagnosis Date Noted  . Insomnia 04/25/2020  . RLS (restless legs syndrome) 04/25/2020  . Depression 04/25/2020  . Arthritis of shoulder 03/28/2020  . Primary osteoarthritis, right shoulder 01/11/2020   Past Medical History:  Diagnosis Date  . Anxiety   . Arthritis   . Depression   . Sleep apnea    does not uses CPAP; uses mouth guard.     Family History  Problem Relation Age of Onset  . Breast cancer Mother   . Diabetes Father   . Hydrocephalus Father     Past  Surgical History:  Procedure Laterality Date  . ANKLE SURGERY Right 2008  . BACK SURGERY  2009  . REPLACEMENT TOTAL KNEE Right 2012  . TONSILLECTOMY    . TOTAL SHOULDER ARTHROPLASTY Right 03/28/2020   Procedure: RIGHT SHOULDER REPLACEMENT;  Surgeon: Cammy Copa, MD;  Location: [facility] OR;  Service: Orthopedics;  Laterality: Right;   Social History   Occupational History  . Occupation: retired  Tobacco Use  . Smoking status: Former Smoker    Types: Cigarettes    Quit date: 07/22/1989    Years since quitting: 30.8  . Smokeless tobacco: Never Used  Substance and Sexual Activity  . Alcohol use: Yes    Comment: social  . Drug use: Never  . Sexual activity: Yes    Partners: Male

## 2020-05-12 ENCOUNTER — Other Ambulatory Visit: Payer: Self-pay

## 2020-05-12 ENCOUNTER — Encounter: Payer: Self-pay | Admitting: Rehabilitative and Restorative Service Providers"

## 2020-05-12 ENCOUNTER — Ambulatory Visit (INDEPENDENT_AMBULATORY_CARE_PROVIDER_SITE_OTHER): Payer: No Typology Code available for payment source | Admitting: Rehabilitative and Restorative Service Providers"

## 2020-05-12 DIAGNOSIS — M25511 Pain in right shoulder: Secondary | ICD-10-CM

## 2020-05-12 DIAGNOSIS — R600 Localized edema: Secondary | ICD-10-CM | POA: Diagnosis not present

## 2020-05-12 DIAGNOSIS — M25611 Stiffness of right shoulder, not elsewhere classified: Secondary | ICD-10-CM

## 2020-05-12 NOTE — Therapy (Signed)
[facility] [address] [place], [place], [address] Phone: [phone]   Fax:  [phone]  Physical Therapy Treatment  Patient Details  Name: Beth [name] MRN: [id number] Date of Birth: [birth year] Referring Provider (PT): Rise Paganini MD   Encounter Date: 05/12/2020  PT End of Session - 05/12/20 1058    Visit Number  8    Number of Visits  17    Date for PT Re-Evaluation  05/26/20    PT Start Time  1059    PT Stop Time  1150    PT Time Calculation (min)  51 min    Activity Tolerance  Patient tolerated treatment well    Behavior During Therapy  [facility] for tasks assessed/performed       Past Medical History:  Diagnosis Date  . Anxiety   . Arthritis   . Depression   . Sleep apnea    does not uses CPAP; uses mouth guard.     Past Surgical History:  Procedure Laterality Date  . ANKLE SURGERY Right 2008  . BACK SURGERY  2009  . REPLACEMENT TOTAL KNEE Right 2012  . TONSILLECTOMY    . TOTAL SHOULDER ARTHROPLASTY Right 03/28/2020   Procedure: RIGHT SHOULDER REPLACEMENT;  Surgeon: Cammy Copa, MD;  Location: [facility] OR;  Service: Orthopedics;  Laterality: Right;    There were no vitals filed for this visit.  Subjective Assessment - 05/12/20 1107    Subjective  Pt. indicated no pain upon arrival today, indicating she feels pretty good overall.    Patient Stated Goals  Get back to lifting and normal ADL's.    Currently in Pain?  No/denies    Pain Score  0-No pain    Pain Location  Shoulder    Pain Orientation  Right    Pain Descriptors / Indicators  Sore    Pain Type  Acute pain;Surgical pain    Pain Onset  More than a month ago    Pain Frequency  Occasional    Aggravating Factors   end range, house/yard work at times                        [facility] Adult PT Treatment/Exercise - 05/12/20 0001      Shoulder Exercises: Supine   Other Supine Exercises  supine wand flexion 2 lbs to fatigue       Shoulder Exercises:  Sidelying   Flexion  Left;Other (comment)   3 x 10   Flexion Weight (lbs)  1    ABduction  Strengthening;Left;Other (comment)   3x 10   ABduction Weight (lbs)  1      Shoulder Exercises: Standing   Extension  Strengthening   3 x 15   Theraband Level (Shoulder Extension)  Level 3 (Green)    Row  Strengthening    Theraband Level (Shoulder Row)  Level 3 (Green)    Other Standing Exercises  standing 90 deg flexion circles cw, ccw 2 x 30 each way      Shoulder Exercises: Pulleys   Flexion  2 minutes    Scaption  2 minutes      Vasopneumatic   Number Minutes Vasopneumatic   10 minutes    Vasopnuematic Location   Shoulder    Vasopneumatic Pressure  Medium    Vasopneumatic Temperature   34                  PT Long Term Goals - 05/09/20 1229  PT LONG TERM GOAL #1   Title  Pt will be indepdent in her HEP and progression.    Time  6    Period  Weeks    Status  On-going    Target Date  05/26/20      PT LONG TERM GOAL #2   Title  Pt will be able to improve her R shoulder flexion to >/= 150 degrees.    Baseline  see flowsheets    Status  Achieved      PT LONG TERM GOAL #3   Title  Pt will be albe to improve R shoulder ER to >/= 60 degrees.    Baseline  see flowsheets    Status  Achieved      PT LONG TERM GOAL #4   Title  Pt will be able to report completeing her ADL's with pain </= 2/10.    Status  On-going    Target Date  05/26/20            Plan - 05/12/20 1121    Clinical Impression Statement  Pt. to benefit from continued strengthening and endurance improvements in Rt gh jt motion at this time to improve fatigue response to functional activity.    Personal Factors and Comorbidities  Comorbidity 3+    Comorbidities  anxiety, R TKA, depression, sleep apnea, arthritis    Examination-Activity Limitations  Sleep;Carry;Lift;Bathing;Hygiene/Grooming;Dressing    Examination-Participation Restrictions  Community Activity;Shop;Laundry;Cleaning;Other     Stability/Clinical Decision Making  Stable/Uncomplicated    Rehab Potential  Good    PT Frequency  2x / week    PT Duration  6 weeks    PT Treatment/Interventions  ADLs/Self Care Home Management;Cryotherapy;Electrical Stimulation;Ultrasound;Functional mobility training;Therapeutic activities;Therapeutic exercise;Neuromuscular re-education;Patient/family education;Manual techniques;Passive range of motion;Taping;Vasopneumatic Device    PT Next Visit Plan  continue to improve strength/endurance control for Rt Gh joint    PT Home Exercise Plan  Access Code: [email or web address]    Consulted and Agree with Plan of Care  Patient       Patient will benefit from skilled therapeutic intervention in order to improve the following deficits and impairments:  Pain, Postural dysfunction, Decreased strength, Decreased range of motion, Increased edema, Decreased mobility  Visit Diagnosis: Acute pain of right shoulder  Stiffness of right shoulder, not elsewhere classified  Localized edema     Problem List Patient Active Problem List   Diagnosis Date Noted  . Insomnia 04/25/2020  . RLS (restless legs syndrome) 04/25/2020  . Depression 04/25/2020  . Arthritis of shoulder 03/28/2020  . Primary osteoarthritis, right shoulder 01/11/2020    Scot Jun, PT, DPT, OCS, ATC 05/12/20  11:49 AM     [facility] [address] [place], [place], [address] Phone: [phone]   Fax:  [phone]  Name: Vonna Brabson MRN: [id number] Date of Birth: [birth year]

## 2020-05-16 ENCOUNTER — Other Ambulatory Visit: Payer: Self-pay

## 2020-05-16 ENCOUNTER — Encounter: Payer: Self-pay | Admitting: Physical Therapy

## 2020-05-16 ENCOUNTER — Ambulatory Visit (INDEPENDENT_AMBULATORY_CARE_PROVIDER_SITE_OTHER): Payer: No Typology Code available for payment source | Admitting: Physical Therapy

## 2020-05-16 DIAGNOSIS — M25611 Stiffness of right shoulder, not elsewhere classified: Secondary | ICD-10-CM | POA: Diagnosis not present

## 2020-05-16 DIAGNOSIS — R600 Localized edema: Secondary | ICD-10-CM

## 2020-05-16 DIAGNOSIS — M25511 Pain in right shoulder: Secondary | ICD-10-CM

## 2020-05-16 NOTE — Therapy (Signed)
[facility] [address] [place], [place], [address] Phone: [phone]   Fax:  [phone]  Physical Therapy Treatment  Patient Details  Name: Beth [name] MRN: [id number] Date of Birth: 07-27-1956 Referring Provider (PT): Marcene Duos MD   Encounter Date: 05/16/2020  PT End of Session - 05/16/20 1346    Visit Number  9    Number of Visits  17    Date for PT Re-Evaluation  05/26/20    PT Start Time  1300    PT Stop Time  1350    PT Time Calculation (min)  50 min    Activity Tolerance  Patient tolerated treatment well    Behavior During Therapy  [facility] for tasks assessed/performed       Past Medical History:  Diagnosis Date  . Anxiety   . Arthritis   . Depression   . Sleep apnea    does not uses CPAP; uses mouth guard.     Past Surgical History:  Procedure Laterality Date  . ANKLE SURGERY Right 2008  . BACK SURGERY  2009  . REPLACEMENT TOTAL KNEE Right 2012  . TONSILLECTOMY    . TOTAL SHOULDER ARTHROPLASTY Right 03/28/2020   Procedure: RIGHT SHOULDER REPLACEMENT;  Surgeon: Meredith Pel, MD;  Location: [place];  Service: Orthopedics;  Laterality: Right;    There were no vitals filed for this visit.  Subjective Assessment - 05/16/20 1301    Subjective  doing well, shoulder is feeling pretty good.    Patient Stated Goals  Get back to lifting and normal ADL's.    Currently in Pain?  No/denies    Pain Onset  More than a month ago                        [facility] Adult PT Treatment/Exercise - 05/16/20 1303      Shoulder Exercises: Standing   External Rotation  Right;Theraband   3x10   Internal Rotation  Right;Theraband   3x10   Theraband Level (Shoulder Internal Rotation)  Level 3 (Green)    Extension  Strengthening   3 x 10   Theraband Level (Shoulder Extension)  Level 4 (Blue)    Row  Strengthening   3x10   Theraband Level (Shoulder Row)  Level 4 (Blue)    Other Standing Exercises  hammer curl with  overhead press 2#, Rt,     Other Standing Exercises  flexion to 2nd shelf 2# 3x10; abduction to 1st shelf 2# 3x10      Shoulder Exercises: ROM/Strengthening   UBE (Upper Arm Bike)  L4 x 6 min (3' each direction)    Cybex Row Limitations  20# 3x10    Other ROM/Strengthening Exercises  tricep press 15# 3x10      Vasopneumatic   Number Minutes Vasopneumatic   10 minutes    Vasopnuematic Location   Shoulder    Vasopneumatic Pressure  Medium    Vasopneumatic Temperature   34                  PT Long Term Goals - 05/09/20 1229      PT LONG TERM GOAL #1   Title  Pt will be indepdent in her HEP and progression.    Time  6    Period  Weeks    Status  On-going    Target Date  05/26/20      PT LONG TERM GOAL #2   Title  Pt will be  able to improve her R shoulder flexion to >/= 150 degrees.    Baseline  see flowsheets    Status  Achieved      PT LONG TERM GOAL #3   Title  Pt will be albe to improve R shoulder ER to >/= 60 degrees.    Baseline  see flowsheets    Status  Achieved      PT LONG TERM GOAL #4   Title  Pt will be able to report completeing her ADL's with pain </= 2/10.    Status  On-going    Target Date  05/26/20            Plan - 05/16/20 1347    Clinical Impression Statement  Session today focused on continued strengthening and pt had expected muscle fatigue following session.  Overall doing well with PT and anticipate d/c next visit.    Personal Factors and Comorbidities  Comorbidity 3+    Comorbidities  anxiety, R TKA, depression, sleep apnea, arthritis    Examination-Activity Limitations  Sleep;Carry;Lift;Bathing;Hygiene/Grooming;Dressing    Examination-Participation Restrictions  Community Activity;Shop;Laundry;Cleaning;Other    Stability/Clinical Decision Making  Stable/Uncomplicated    Rehab Potential  Good    PT Frequency  2x / week    PT Duration  6 weeks    PT Treatment/Interventions  ADLs/Self Care Home Management;Cryotherapy;Electrical  Stimulation;Ultrasound;Functional mobility training;Therapeutic activities;Therapeutic exercise;Neuromuscular re-education;Patient/family education;Manual techniques;Passive range of motion;Taping;Vasopneumatic Device    PT Next Visit Plan  check goals, plan for d/c, needs t shirt    PT Home Exercise Plan  Access Code: [email or web address]    Consulted and Agree with Plan of Care  Patient       Patient will benefit from skilled therapeutic intervention in order to improve the following deficits and impairments:  Pain, Postural dysfunction, Decreased strength, Decreased range of motion, Increased edema, Decreased mobility  Visit Diagnosis: Acute pain of right shoulder  Stiffness of right shoulder, not elsewhere classified  Localized edema     Problem List Patient Active Problem List   Diagnosis Date Noted  . Insomnia 04/25/2020  . RLS (restless legs syndrome) 04/25/2020  . Depression 04/25/2020  . Arthritis of shoulder 03/28/2020  . Primary osteoarthritis, right shoulder 01/11/2020      [name], PT, DPT 05/16/20 1:52 PM    [facility] Physical Therapy [address] [place], [place], [address] Phone: [phone]   Fax:  [phone]  Name: Beth [name] MRN: [id number] Date of Birth: June 20, 1956

## 2020-05-25 ENCOUNTER — Encounter: Payer: Self-pay | Admitting: Physical Therapy

## 2020-05-25 ENCOUNTER — Other Ambulatory Visit: Payer: Self-pay

## 2020-05-25 ENCOUNTER — Ambulatory Visit (INDEPENDENT_AMBULATORY_CARE_PROVIDER_SITE_OTHER): Payer: No Typology Code available for payment source | Admitting: Physical Therapy

## 2020-05-25 DIAGNOSIS — R600 Localized edema: Secondary | ICD-10-CM

## 2020-05-25 DIAGNOSIS — M25611 Stiffness of right shoulder, not elsewhere classified: Secondary | ICD-10-CM

## 2020-05-25 DIAGNOSIS — M25511 Pain in right shoulder: Secondary | ICD-10-CM | POA: Diagnosis not present

## 2020-05-25 NOTE — Therapy (Signed)
[facility] [address] [place], [place], [address] Phone: [phone]   Fax:  [phone]  Physical Therapy Treatment/Discharge Summary  Patient Details  Name: Beth [name] MRN: [id number] Date of Birth: 11-Mar-1956 Referring Provider (PT): Marcene Duos MD   Encounter Date: 05/25/2020   PT End of Session - 05/25/20 1416    Visit Number 10    PT Start Time 3704   d/c visit   PT Stop Time 1413    PT Time Calculation (min) 25 min    Activity Tolerance Patient tolerated treatment well    Behavior During Therapy [facility] for tasks assessed/performed           Past Medical History:  Diagnosis Date  . Anxiety   . Arthritis   . Depression   . Sleep apnea    does not uses CPAP; uses mouth guard.     Past Surgical History:  Procedure Laterality Date  . ANKLE SURGERY Right 2008  . BACK SURGERY  2009  . REPLACEMENT TOTAL KNEE Right 2012  . TONSILLECTOMY    . TOTAL SHOULDER ARTHROPLASTY Right 03/28/2020   Procedure: RIGHT SHOULDER REPLACEMENT;  Surgeon: Meredith Pel, MD;  Location: [facility];  Service: Orthopedics;  Laterality: Right;    There were no vitals filed for this visit.   Subjective Assessment - 05/25/20 1345    Subjective feels ready to d/c today.    Patient Stated Goals Get back to lifting and normal ADL's.    Currently in Pain? No/denies    Pain Onset More than a month ago              [facility] PT Assessment - 05/25/20 1405      Assessment   Medical Diagnosis R reverse shoulder    Referring Provider (PT) Marcene Duos MD    Onset Date/Surgical Date 03/28/20      AROM   Right Shoulder Flexion 140 Degrees    Right Shoulder ABduction 160 Degrees    Right Shoulder Internal Rotation --   Rt thumb to glute   Right Shoulder External Rotation --   Rt hand to C7/T1     Strength   Strength Assessment Site Shoulder    Right/Left Shoulder Right    Right Shoulder Flexion 5/5    Right Shoulder ABduction 5/5    Right  Shoulder Internal Rotation 5/5    Right Shoulder External Rotation 3/5                         [facility] Adult PT Treatment/Exercise - 05/25/20 1349      Shoulder Exercises: Standing   External Rotation Right;Theraband   3x10   Theraband Level (Shoulder External Rotation) Level 4 (Blue)    Internal Rotation Right;Theraband   3x10   Theraband Level (Shoulder Internal Rotation) Level 4 (Blue)    Extension Strengthening   3 x 10   Theraband Level (Shoulder Extension) Level 4 (Blue)    Row Strengthening   3x10   Theraband Level (Shoulder Row) Level 4 (Blue)      Shoulder Exercises: ROM/Strengthening   UBE (Upper Arm Bike) L4 x 4 min (2' each direction)                  PT Education - 05/25/20 1416    Education Details HEP    Person(s) Educated Patient    Methods Explanation;Demonstration;Handout    Comprehension Verbalized understanding;Returned demonstration  PT Long Term Goals - 05/25/20 1417      PT LONG TERM GOAL #1   Title Pt will be indepdent in her HEP and progression.    Time 6    Period Weeks    Status Achieved      PT LONG TERM GOAL #2   Title Pt will be able to improve her R shoulder flexion to >/= 150 degrees.    Baseline see flowsheets    Status Achieved      PT LONG TERM GOAL #3   Title Pt will be albe to improve R shoulder ER to >/= 60 degrees.    Baseline see flowsheets    Status Achieved      PT LONG TERM GOAL #4   Title Pt will be able to report completeing her ADL's with pain </= 2/10.    Status Achieved                 Plan - 05/25/20 1417    Clinical Impression Statement Pt has met all goals and is ready for d/c from PT.  Encouraged continued work on [employer].    Personal Factors and Comorbidities Comorbidity 3+    Comorbidities anxiety, R TKA, depression, sleep apnea, arthritis    Examination-Activity Limitations Sleep;Carry;Lift;Bathing;Hygiene/Grooming;Dressing    Examination-Participation  Restrictions Community Activity;Shop;Laundry;Cleaning;Other    Stability/Clinical Decision Making Stable/Uncomplicated    Rehab Potential Good    PT Frequency 2x / week    PT Duration 6 weeks    PT Treatment/Interventions ADLs/Self Care Home Management;Cryotherapy;Electrical Stimulation;Ultrasound;Functional mobility training;Therapeutic activities;Therapeutic exercise;Neuromuscular re-education;Patient/family education;Manual techniques;Passive range of motion;Taping;Vasopneumatic Device    PT Next Visit Plan d/c PT today    PT Home Exercise Plan Access Code: [email or web address]    Consulted and Agree with Plan of Care Patient           Patient will benefit from skilled therapeutic intervention in order to improve the following deficits and impairments:  Pain, Postural dysfunction, Decreased strength, Decreased range of motion, Increased edema, Decreased mobility  Visit Diagnosis: Acute pain of right shoulder  Stiffness of right shoulder, not elsewhere classified  Localized edema     Problem List Patient Active Problem List   Diagnosis Date Noted  . Insomnia 04/25/2020  . RLS (restless legs syndrome) 04/25/2020  . Depression 04/25/2020  . Arthritis of shoulder 03/28/2020  . Primary osteoarthritis, right shoulder 01/11/2020      Laureen Abrahams, PT, DPT 05/25/20 2:20 PM    [facility] [address] [place], [place], [address] Phone: [phone]   Fax:  [phone]  Name: Beth [name] MRN: [id number] Date of Birth: 22-Jun-1956       PHYSICAL THERAPY DISCHARGE SUMMARY  Visits from Start of Care: 10  Current functional level related to goals / functional outcomes: See above   Remaining deficits: See above   Education / Equipment: HEP  Plan: Patient agrees to discharge.  Patient goals were met. Patient is being discharged due to meeting the stated rehab goals.  ?????       Laureen Abrahams, PT, DPT 05/25/20 2:20  PM  [facility] [address] [place], [place], [address] Phone: [phone]   Fax:  [phone]

## 2020-05-25 NOTE — Patient Instructions (Signed)
Access Code: [email or web address] URL: [email or web address] Date: 05/25/2020 Prepared by: Moshe Cipro  Exercises Standing Shoulder Row with Anchored Resistance - 1 x daily - 3-4 x weekly - 3 sets - 10 reps Shoulder Extension with Resistance - 1 x daily - 3-4 x weekly - 3 sets - 10 reps Shoulder Internal Rotation with Resistance - 1 x daily - 3-4 x weekly - 1 sets - 10 reps Shoulder External Rotation with Anchored Resistance - 1 x daily - 3-4 x weekly - 3 sets - 10 reps

## 2020-06-06 ENCOUNTER — Other Ambulatory Visit: Payer: Self-pay | Admitting: Surgical

## 2020-06-06 ENCOUNTER — Other Ambulatory Visit: Payer: Self-pay | Admitting: Family

## 2020-06-07 NOTE — Telephone Encounter (Signed)
GD pt  

## 2020-06-07 NOTE — Telephone Encounter (Signed)
Ok to rf? 

## 2020-06-22 ENCOUNTER — Ambulatory Visit: Payer: No Typology Code available for payment source | Admitting: Orthopedic Surgery

## 2020-06-28 ENCOUNTER — Ambulatory Visit (INDEPENDENT_AMBULATORY_CARE_PROVIDER_SITE_OTHER): Payer: No Typology Code available for payment source | Admitting: Orthopedic Surgery

## 2020-06-28 DIAGNOSIS — Z96611 Presence of right artificial shoulder joint: Secondary | ICD-10-CM

## 2020-07-01 ENCOUNTER — Encounter: Payer: Self-pay | Admitting: Orthopedic Surgery

## 2020-07-01 NOTE — Progress Notes (Signed)
° °  Office Visit Note   Patient: Beth [name]           Date of Birth: [birth year]           MRN: [id number] Visit Date: 06/28/2020 Requested by: Sandford Craze, NP [address] [place],  [place] [address] PCP: Sandford Craze, NP  Subjective: Chief Complaint  Patient presents with   Right Shoulder - Follow-up    HPI: Beth [name] is a patient is now about 3 months out right shoulder replacement.  She is doing well.  Occasional aching.  Very functional with range of motion.  Doing home exercise program.  No medications.  Overall Beth [name] is done well from her shoulder replacement.  Cautioned her about heavy lifting.  Follow-up as needed.              ROS: See above  Assessment & Plan: Visit Diagnoses:  1. S/P reverse total shoulder arthroplasty, right     Plan: See above  Follow-Up Instructions: No follow-ups on file.   Orders:  No orders of the defined types were placed in this encounter.  No orders of the defined types were placed in this encounter.     Procedures: No procedures performed   Clinical Data: No additional findings.  Objective: Vital Signs: There were no vitals taken for this visit.  Physical Exam: See above  Ortho Exam: See above  Specialty Comments:  No specialty comments available.  Imaging: No results found.   PMFS History: Patient Active Problem List   Diagnosis Date Noted   Insomnia 04/25/2020   RLS (restless legs syndrome) 04/25/2020   Depression 04/25/2020   Arthritis of shoulder 03/28/2020   Primary osteoarthritis, right shoulder 01/11/2020   Past Medical History:  Diagnosis Date   Anxiety    Arthritis    Depression    Sleep apnea    does not uses CPAP; uses mouth guard.     Family History  Problem Relation Age of Onset   Breast cancer Mother    Diabetes Father    Hydrocephalus Father     Past Surgical History:  Procedure Laterality Date   ANKLE SURGERY Right 2008   BACK SURGERY   2009   REPLACEMENT TOTAL KNEE Right 2012   TONSILLECTOMY     TOTAL SHOULDER ARTHROPLASTY Right 03/28/2020   Procedure: RIGHT SHOULDER REPLACEMENT;  Surgeon: Cammy Copa, MD;  Location: [facility];  Service: Orthopedics;  Laterality: Right;   Social History   Occupational History   Occupation: retired  Tobacco Use   Smoking status: Former Smoker    Types: Cigarettes    Quit date: 07/22/1989    Years since quitting: 30.9   Smokeless tobacco: Never Used  Substance and Sexual Activity   Alcohol use: Yes    Comment: social   Drug use: Never   Sexual activity: Yes    Partners: Male

## 2020-07-10 ENCOUNTER — Other Ambulatory Visit: Payer: Self-pay | Admitting: Family

## 2020-07-26 ENCOUNTER — Other Ambulatory Visit: Payer: Self-pay

## 2020-07-26 ENCOUNTER — Ambulatory Visit (INDEPENDENT_AMBULATORY_CARE_PROVIDER_SITE_OTHER): Payer: No Typology Code available for payment source | Admitting: Family

## 2020-07-26 ENCOUNTER — Telehealth: Payer: Self-pay | Admitting: Family

## 2020-07-26 VITALS — BP 141/78 | HR 76 | Temp 98.4°F | Resp 16 | Ht 63.5 in | Wt 244.0 lb

## 2020-07-26 DIAGNOSIS — G2581 Restless legs syndrome: Secondary | ICD-10-CM | POA: Diagnosis not present

## 2020-07-26 DIAGNOSIS — G4733 Obstructive sleep apnea (adult) (pediatric): Secondary | ICD-10-CM | POA: Diagnosis not present

## 2020-07-26 DIAGNOSIS — G4700 Insomnia, unspecified: Secondary | ICD-10-CM | POA: Diagnosis not present

## 2020-07-26 MED ORDER — ROPINIROLE HCL 1 MG PO TABS: 1.0000 mg | ORAL_TABLET | Freq: Every day | ORAL | 1 refills | Status: DC

## 2020-07-26 MED ORDER — LORAZEPAM 0.5 MG PO TABS: ORAL_TABLET | ORAL | 0 refills | Status: DC

## 2020-07-26 NOTE — Progress Notes (Signed)
Subjective:    Patient ID: Beth [name], female    DOB: Jan 15, 1956, 64 y.o.   MRN: [id number]  HPI  Patient is a 64 yr old female who presents today for follow up of her insomnia. Last visit we gave her a trial of clonazepam. She reports no improvement with 1 tab and only slight improvement with 2 tabs.   OSA- wears an oral appliance. Has not had a sleep  She reports that she feels lethargic often during the day.    RLS- last visit we gave her a trial of requip. Notes that she has some improvement but "it feels like it wears off."    Review of Systems See HPI  Past Medical History:  Diagnosis Date  . Anxiety   . Arthritis   . Depression   . Sleep apnea    does not uses CPAP; uses mouth guard.      Social History   Socioeconomic History  . Marital status: Married    Spouse name: Ron  . Number of children: 2  . Years of education: Not on file  . Highest education level: Not on file  Occupational History  . Occupation: retired  Tobacco Use  . Smoking status: Former Smoker    Types: Cigarettes    Quit date: 07/22/1989    Years since quitting: 31.0  . Smokeless tobacco: Never Used  Substance and Sexual Activity  . Alcohol use: Yes    Comment: social  . Drug use: Never  . Sexual activity: Yes    Partners: Male  Other Topics Concern  . Not on file  Social History Narrative   Retired Music therapist   Divorced (from [place] originally)   Remarried 2015 (husband is a IT trainer)   Has a dog   2 children (son and daughter) both married, daughter lives in [place], son in [place]. No grandchildren   Enjoys baking, sewing, reading   Social Determinants of Health   Financial Resource Strain:   . Difficulty of Paying Living Expenses:   Food Insecurity:   . Worried About Programme researcher, broadcasting/film/video in the Last Year:   . Barista in the Last Year:   Transportation Needs:   . Freight forwarder (Medical):   Marland Kitchen Lack of Transportation (Non-Medical):   Physical Activity:   . Days  of Exercise per Week:   . Minutes of Exercise per Session:   Stress:   . Feeling of Stress :   Social Connections:   . Frequency of Communication with Friends and Family:   . Frequency of Social Gatherings with Friends and Family:   . Attends Religious Services:   . Active Member of Clubs or Organizations:   . Attends Banker Meetings:   Marland Kitchen Marital Status:   Intimate Partner Violence:   . Fear of Current or Ex-Partner:   . Emotionally Abused:   Marland Kitchen Physically Abused:   . Sexually Abused:     Past Surgical History:  Procedure Laterality Date  . ANKLE SURGERY Right 2008  . BACK SURGERY  2009  . REPLACEMENT TOTAL KNEE Right 2012  . TONSILLECTOMY    . TOTAL SHOULDER ARTHROPLASTY Right 03/28/2020   Procedure: RIGHT SHOULDER REPLACEMENT;  Surgeon: Cammy Copa, MD;  Location: [facility];  Service: Orthopedics;  Laterality: Right;    Family History  Problem Relation Age of Onset  . Breast cancer Mother   . Diabetes Father   . Hydrocephalus Father     No  Known Allergies  Current Outpatient Medications on File Prior to Visit  Medication Sig Dispense Refill  . b complex vitamins tablet Take 1 tablet by mouth daily.    Marland Kitchen BIOTIN PO Take 1 tablet by mouth daily.    Marland Kitchen CALCIUM PO Take 1 tablet by mouth daily.    . celecoxib (CELEBREX) 200 MG capsule TAKE ONE CAPSULE BY MOUTH DAILY AS NEEDED 30 capsule 0  . Cholecalciferol (VITAMIN D) 50 MCG (2000 UT) CAPS Take 2,000 Units by mouth daily.    . ferrous sulfate 325 (65 FE) MG tablet Take 325 mg by mouth daily with breakfast.    . Glucosamine HCl-MSM (GLUCOSAMINE-MSM PO) Take 2 tablets by mouth daily.    . Multiple Minerals (CALCIUM-MAGNESIUM-ZINC) TABS Take 3 tablets by mouth daily.    . sertraline (ZOLOFT) 100 MG tablet Take 1 tablet (100 mg total) by mouth daily. 90 tablet 1   No current facility-administered medications on file prior to visit.    BP (!) 141/78 (BP Location: Right Arm, Patient Position: Sitting, Cuff  Size: Large)   Pulse 76   Temp 98.4 F (36.9 C) (Oral)   Resp 16   Ht 5' 3.5" (1.613 m)   Wt 244 lb (110.7 kg)   SpO2 99%   BMI 42.54 kg/m       Objective:   Physical Exam Constitutional:      Appearance: She is well-developed.  Neck:     Thyroid: No thyromegaly.  Cardiovascular:     Rate and Rhythm: Normal rate and regular rhythm.     Heart sounds: Normal heart sounds. No murmur heard.   Pulmonary:     Effort: Pulmonary effort is normal. No respiratory distress.     Breath sounds: Normal breath sounds. No wheezing.  Musculoskeletal:     Cervical back: Neck supple.  Skin:    General: Skin is warm and dry.  Neurological:     Mental Status: She is alert and oriented to person, place, and time.  Psychiatric:        Behavior: Behavior normal.        Thought Content: Thought content normal.        Judgment: Judgment normal.            Assessment & Plan:  RLS- uncontrolled. Will increase requip from 0.5mg  to 1mg .  OSA- I would like to have this re-evaluated.  Will refer to sleep specialist.  Insomnia- uncontrolled.  Will d/c clonazepam and give trial of ativan.  This visit occurred during the SARS-CoV-2 public health emergency.  Safety protocols were in place, including screening questions prior to the visit, additional usage of staff PPE, and extensive cleaning of exam room while observing appropriate contact time as indicated for disinfecting solutions.

## 2020-07-26 NOTE — Telephone Encounter (Signed)
Caller : Environmental health practitioner, patient picked up a prescription for : clonazePAM (KLONOPIN) 0.5 MG tablet [[id number]] DISCONTINUED  ON 07/11/2020  Per pharmacy Patient has a prescription sent in today for :  LORazepam (ATIVAN) 0.5 MG tablet [[id number]]    Please advise if patient should have both

## 2020-07-26 NOTE — Patient Instructions (Signed)
Stop clonazepam, start lorazepam.  Please call [facility] to schedule your appointment with Dr.  Craige Cotta. [phone] Increase Requip from 0.5mg  to 1mg .

## 2020-07-27 ENCOUNTER — Encounter: Payer: Self-pay | Admitting: Orthopedic Surgery

## 2020-07-27 DIAGNOSIS — G4733 Obstructive sleep apnea (adult) (pediatric): Secondary | ICD-10-CM | POA: Insufficient documentation

## 2020-07-27 NOTE — Telephone Encounter (Signed)
Take 4 pills (2,000 mg) 30-60 mins prior to dental procedure

## 2020-07-27 NOTE — Telephone Encounter (Signed)
Please advise pharmacy- ok to dispense lorazepam. We have discontinued clonazepam.

## 2020-07-28 NOTE — Telephone Encounter (Signed)
Pharmacy advised  

## 2020-08-10 ENCOUNTER — Other Ambulatory Visit: Payer: Self-pay | Admitting: Surgical

## 2020-08-10 NOTE — Telephone Encounter (Signed)
Pls advise.  

## 2020-08-29 ENCOUNTER — Other Ambulatory Visit: Payer: Self-pay | Admitting: Family

## 2020-09-02 ENCOUNTER — Other Ambulatory Visit: Payer: Self-pay | Admitting: Surgical

## 2020-10-12 ENCOUNTER — Other Ambulatory Visit: Payer: Self-pay | Admitting: Family

## 2020-10-12 ENCOUNTER — Other Ambulatory Visit: Payer: Self-pay | Admitting: Surgical

## 2020-10-12 NOTE — Telephone Encounter (Signed)
Please advise. Thanks.  

## 2020-10-27 ENCOUNTER — Other Ambulatory Visit: Payer: Self-pay

## 2020-10-27 ENCOUNTER — Ambulatory Visit (INDEPENDENT_AMBULATORY_CARE_PROVIDER_SITE_OTHER): Payer: No Typology Code available for payment source | Admitting: Family

## 2020-10-27 ENCOUNTER — Encounter: Payer: Self-pay | Admitting: Family

## 2020-10-27 VITALS — BP 146/58 | HR 80 | Temp 97.8°F | Resp 16 | Ht 63.5 in | Wt 249.0 lb

## 2020-10-27 DIAGNOSIS — H60543 Acute eczematoid otitis externa, bilateral: Secondary | ICD-10-CM

## 2020-10-27 DIAGNOSIS — G2581 Restless legs syndrome: Secondary | ICD-10-CM | POA: Diagnosis not present

## 2020-10-27 DIAGNOSIS — G4700 Insomnia, unspecified: Secondary | ICD-10-CM | POA: Diagnosis not present

## 2020-10-27 DIAGNOSIS — Z1211 Encounter for screening for malignant neoplasm of colon: Secondary | ICD-10-CM

## 2020-10-27 DIAGNOSIS — G4733 Obstructive sleep apnea (adult) (pediatric): Secondary | ICD-10-CM

## 2020-10-27 DIAGNOSIS — Z0000 Encounter for general adult medical examination without abnormal findings: Secondary | ICD-10-CM

## 2020-10-27 MED ORDER — TRIAMCINOLONE ACETONIDE 0.1 % EX CREA: 1.0000 "application " | TOPICAL_CREAM | Freq: Two times a day (BID) | CUTANEOUS | 0 refills | Status: DC

## 2020-10-27 MED ORDER — ROPINIROLE HCL 0.5 MG PO TABS: 0.5000 mg | ORAL_TABLET | Freq: Every day | ORAL | 1 refills | Status: DC

## 2020-10-27 MED ORDER — ROPINIROLE HCL 1 MG PO TABS: 1.0000 mg | ORAL_TABLET | Freq: Every day | ORAL | 1 refills | Status: DC

## 2020-10-27 NOTE — Patient Instructions (Addendum)
Think about trying Lunesta for sleep.  Let me know if you decide you want to try it.  Please check colon cancer screening with your insurance- let me know if you prefer colonoscopy or the cologuard test. You should be contacted about scheduling your mammogram.

## 2020-10-27 NOTE — Progress Notes (Signed)
Subjective:    Patient ID: Beth [name], female    DOB: Jun 09, 1956, 64 y.o.   MRN: [id number]  HPI  Patient is a 64 yr old female who presents today for follow up.  RLS- last visit she noted uncontrolled symptoms.  We increased requip from 0.5 mg to 1mg . She states 1mg  did not work but she is taking 1.5mg  and this is helping.    OSA- last visit we referred to a sleep specialist. She is scheduled to see them next week.   Insomnia- last visit we d/c'd clonazepam which was not helping and instead gave her a trial of ativan.  Previous medications tried include ambien and temazepam. She reports trouble falling asleep. Takes a few hours "if not longer."  She reports that her son gave her seroquel which helped.  She is using CBD oil- not sure if it is helping yet.   Reports that her ears are "dry and itching".    Review of Systems See HPI  Past Medical History:  Diagnosis Date  . Anxiety   . Arthritis   . Depression   . Sleep apnea    does not uses CPAP; uses mouth guard.      Social History   Socioeconomic History  . Marital status: Married    Spouse name: Ron  . Number of children: 2  . Years of education: Not on file  . Highest education level: Not on file  Occupational History  . Occupation: retired  Tobacco Use  . Smoking status: Former Smoker    Types: Cigarettes    Quit date: 07/22/1989    Years since quitting: 31.2  . Smokeless tobacco: Never Used  Substance and Sexual Activity  . Alcohol use: Yes    Comment: social  . Drug use: Never  . Sexual activity: Yes    Partners: Male  Other Topics Concern  . Not on file  Social History Narrative   Retired   Divorced (from 07/24/1989 originally)   Remarried 2015 (husband is a [place])   Has a dog   2 children (son and daughter) both married, daughter lives in [place], son in [place]. No grandchildren   Enjoys baking, sewing, reading   Social Determinants of Health   Financial Resource Strain:   . Difficulty  of Paying Living Expenses: Not on file  Food Insecurity:   . Worried About [place] in the Last Year: Not on file  . Ran Out of Food in the Last Year: Not on file  Transportation Needs:   . Lack of Transportation (Medical): Not on file  . Lack of Transportation (Non-Medical): Not on file  Physical Activity:   . Days of Exercise per Week: Not on file  . Minutes of Exercise per Session: Not on file  Stress:   . Feeling of Stress : Not on file  Social Connections:   . Frequency of Communication with Friends and Family: Not on file  . Frequency of Social Gatherings with Friends and Family: Not on file  . Attends Religious Services: Not on file  . Active Member of Clubs or Organizations: Not on file  . Attends [place] Meetings: Not on file  . Marital Status: Not on file  Intimate Partner Violence:   . Fear of Current or Ex-Partner: Not on file  . Emotionally Abused: Not on file  . Physically Abused: Not on file  . Sexually Abused: Not on file    Past Surgical History:  Procedure Laterality Date  . ANKLE SURGERY Right 2008  . BACK SURGERY  2009  . REPLACEMENT TOTAL KNEE Right 2012  . TONSILLECTOMY    . TOTAL SHOULDER ARTHROPLASTY Right 03/28/2020   Procedure: RIGHT SHOULDER REPLACEMENT;  Surgeon: Cammy Copa, MD;  Location: [facility] OR;  Service: Orthopedics;  Laterality: Right;    Family History  Problem Relation Age of Onset  . Breast cancer Mother   . Diabetes Father   . Hydrocephalus Father     No Known Allergies  Current Outpatient Medications on File Prior to Visit  Medication Sig Dispense Refill  . b complex vitamins tablet Take 1 tablet by mouth daily.    Marland Kitchen BIOTIN PO Take 1 tablet by mouth daily.    Marland Kitchen CALCIUM PO Take 1 tablet by mouth daily.    . celecoxib (CELEBREX) 200 MG capsule TAKE ONE CAPSULE BY MOUTH ONE TIME DAILY 30 capsule 0  . Cholecalciferol (VITAMIN D) 50 MCG (2000 UT) CAPS Take 2,000 Units by mouth daily.    . ferrous  sulfate 325 (65 FE) MG tablet Take 325 mg by mouth daily with breakfast.    . Glucosamine HCl-MSM (GLUCOSAMINE-MSM PO) Take 2 tablets by mouth daily.    . Multiple Minerals (CALCIUM-MAGNESIUM-ZINC) TABS Take 3 tablets by mouth daily.    Marland Kitchen rOPINIRole (REQUIP) 1 MG tablet Take 1 tablet (1 mg total) by mouth at bedtime. 90 tablet 1  . sertraline (ZOLOFT) 100 MG tablet Take 1 tablet (100 mg total) by mouth daily. 90 tablet 0  . LORazepam (ATIVAN) 0.5 MG tablet TAKE ONE OR TWO TABLETS BY MOUTH AT BEDTIME AS NEEDED FOR SLEEP 30 tablet 0   No current facility-administered medications on file prior to visit.    BP (!) 146/58 (BP Location: Right Arm, Patient Position: Sitting, Cuff Size: Large)   Pulse 80   Temp 97.8 F (36.6 C) (Temporal)   Resp 16   Ht 5' 3.5" (1.613 m)   Wt 249 lb (112.9 kg)   SpO2 97%   BMI 43.42 kg/m       Objective:   Physical Exam Constitutional:      Appearance: She is well-developed.  HENT:     Right Ear: Tympanic membrane normal.     Left Ear: Tympanic membrane normal.     Ears:     Comments: Bilateral ear canals are mildly dry appearing Neck:     Thyroid: No thyromegaly.  Cardiovascular:     Rate and Rhythm: Normal rate and regular rhythm.     Heart sounds: Normal heart sounds. No murmur heard.   Pulmonary:     Effort: Pulmonary effort is normal. No respiratory distress.     Breath sounds: Normal breath sounds. No wheezing.  Musculoskeletal:     Cervical back: Neck supple.  Skin:    General: Skin is warm and dry.  Neurological:     Mental Status: She is alert and oriented to person, place, and time.  Psychiatric:        Behavior: Behavior normal.        Thought Content: Thought content normal.        Judgment: Judgment normal.           Assessment & Plan:  Insomnia- unchanged. D/C lorazepam.  She will consider lunesta and let me know if she would like to try this.  Colon cancer screening- she will check with her insurance and see if they  cover cologuard or colonoscopy and let me  know which test she prefers.  RLS- stable/improved. Continue Requip 1.5mg .   OSA- pt advised to keep her upcoming appointment with sleep specialist.  Eczematous otitis externa- trial of triamcinolone cream.   This visit occurred during the SARS-CoV-2 public health emergency.  Safety protocols were in place, including screening questions prior to the visit, additional usage of staff PPE, and extensive cleaning of exam room while observing appropriate contact time as indicated for disinfecting solutions.

## 2020-10-28 ENCOUNTER — Encounter: Payer: Self-pay | Admitting: Family

## 2020-10-30 NOTE — Telephone Encounter (Signed)
Could you please arrange for pt to collect IFOB kit, dx preventative care?

## 2020-10-31 ENCOUNTER — Other Ambulatory Visit: Payer: Self-pay

## 2020-10-31 ENCOUNTER — Ambulatory Visit (INDEPENDENT_AMBULATORY_CARE_PROVIDER_SITE_OTHER): Payer: No Typology Code available for payment source | Admitting: Pulmonary Disease

## 2020-10-31 ENCOUNTER — Encounter: Payer: Self-pay | Admitting: Pulmonary Disease

## 2020-10-31 VITALS — BP 124/76 | HR 100 | Temp 98.3°F | Ht 63.0 in | Wt 247.6 lb

## 2020-10-31 DIAGNOSIS — G4733 Obstructive sleep apnea (adult) (pediatric): Secondary | ICD-10-CM | POA: Diagnosis not present

## 2020-10-31 DIAGNOSIS — Z1211 Encounter for screening for malignant neoplasm of colon: Secondary | ICD-10-CM

## 2020-10-31 MED ORDER — ESZOPICLONE 2 MG PO TABS: 2.0000 mg | ORAL_TABLET | Freq: Every evening | ORAL | 2 refills | Status: DC | PRN

## 2020-10-31 NOTE — Patient Instructions (Addendum)
We will schedule you for home sleep study -Make sure you off your oral device in place when the study is done  Trial with Lunesta for insomnia  I will see you back in 3 months  Graded exercises as tolerated

## 2020-10-31 NOTE — Progress Notes (Signed)
Beth [name]    [id number]    02-07-1956  Primary Care Physician:O'Sullivan, Efraim Kaufmann, NP  Referring Physician: Sandford Craze, NP [address] [place],  [place] [address]  Chief complaint:   Patient being seen for obstructive sleep apnea  HPI:  History of insomnia She does use an oral device  She wakes up gasping History of snoring  Has had to 2 sleep studies in the past both over 64 years old She has been using an oral device since then  Has gained about 50 pounds Multiple knee surgeries, shoulder surgery  Long-term history of difficulty falling asleep Has tried multiple sleep aids in the past that have not helped  She is on an antidepressant  Ambien did not help  Admits to falling asleep around 11 2 1, it usually takes hours to go to sleep 1-2 awakenings Final wake up time between 7 and 9 AM  Weight is up about 50 pounds  Has pets in the house but they do not bother her sleep  Outpatient Encounter Medications as of 10/31/2020  Medication Sig  . b complex vitamins tablet Take 1 tablet by mouth daily.  Marland Kitchen CALCIUM PO Take 1 tablet by mouth daily.  . celecoxib (CELEBREX) 200 MG capsule TAKE ONE CAPSULE BY MOUTH ONE TIME DAILY  . Cholecalciferol (VITAMIN D) 50 MCG (2000 UT) CAPS Take 2,000 Units by mouth daily.  . ferrous sulfate 325 (65 FE) MG tablet Take 325 mg by mouth daily with breakfast.  . Glucosamine HCl-MSM (GLUCOSAMINE-MSM PO) Take 2 tablets by mouth daily.  . Multiple Minerals (CALCIUM-MAGNESIUM-ZINC) TABS Take 3 tablets by mouth daily.  Marland Kitchen rOPINIRole (REQUIP) 0.5 MG tablet Take 1 tablet (0.5 mg total) by mouth at bedtime.  . sertraline (ZOLOFT) 100 MG tablet Take 1 tablet (100 mg total) by mouth daily.  Marland Kitchen triamcinolone (KENALOG) 0.1 % Apply 1 application topically 2 (two) times daily. To ear canals  . eszopiclone (LUNESTA) 2 MG TABS tablet Take 1 tablet (2 mg total) by mouth at bedtime as needed for sleep. Take immediately  before bedtime  . [DISCONTINUED] BIOTIN PO Take 1 tablet by mouth daily.  . [DISCONTINUED] rOPINIRole (REQUIP) 1 MG tablet Take 1 tablet (1 mg total) by mouth at bedtime.   No facility-administered encounter medications on file as of 10/31/2020.    Allergies as of 10/31/2020  . (No Known Allergies)    Past Medical History:  Diagnosis Date  . Anxiety   . Arthritis   . Depression   . Sleep apnea    does not uses CPAP; uses mouth guard.     Past Surgical History:  Procedure Laterality Date  . ANKLE SURGERY Right 2008  . BACK SURGERY  2009  . REPLACEMENT TOTAL KNEE Right 2012  . TONSILLECTOMY    . TOTAL SHOULDER ARTHROPLASTY Right 03/28/2020   Procedure: RIGHT SHOULDER REPLACEMENT;  Surgeon: Cammy Copa, MD;  Location: [facility] OR;  Service: Orthopedics;  Laterality: Right;    Family History  Problem Relation Age of Onset  . Breast cancer Mother   . Diabetes Father   . Hydrocephalus Father     Social History   Socioeconomic History  . Marital status: Married    Spouse name: Ron  . Number of children: 2  . Years of education: Not on file  . Highest education level: Not on file  Occupational History  . Occupation: retired  Tobacco Use  . Smoking status:  Former Smoker    Types: Cigarettes    Quit date: 07/22/1989    Years since quitting: 31.2  . Smokeless tobacco: Never Used  Substance and Sexual Activity  . Alcohol use: Yes    Comment: social  . Drug use: Never  . Sexual activity: Yes    Partners: Male  Other Topics Concern  . Not on file  Social History Narrative   Retired Music therapist   Divorced (from [place] originally)   Remarried 2015 (husband is a IT trainer)   Has a dog   2 children (son and daughter) both married, daughter lives in [place], son in [place]. No grandchildren   Enjoys baking, sewing, reading   Social Determinants of Health   Financial Resource Strain:   . Difficulty of Paying Living Expenses: Not on file  Food Insecurity:   . Worried About  Programme researcher, broadcasting/film/video in the Last Year: Not on file  . Ran Out of Food in the Last Year: Not on file  Transportation Needs:   . Lack of Transportation (Medical): Not on file  . Lack of Transportation (Non-Medical): Not on file  Physical Activity:   . Days of Exercise per Week: Not on file  . Minutes of Exercise per Session: Not on file  Stress:   . Feeling of Stress : Not on file  Social Connections:   . Frequency of Communication with Friends and Family: Not on file  . Frequency of Social Gatherings with Friends and Family: Not on file  . Attends Religious Services: Not on file  . Active Member of Clubs or Organizations: Not on file  . Attends Banker Meetings: Not on file  . Marital Status: Not on file  Intimate Partner Violence:   . Fear of Current or Ex-Partner: Not on file  . Emotionally Abused: Not on file  . Physically Abused: Not on file  . Sexually Abused: Not on file    Review of Systems  Constitutional: Positive for fatigue.  Respiratory: Positive for apnea.   Psychiatric/Behavioral: Positive for sleep disturbance.    Vitals:   10/31/20 0957  BP: 124/76  Pulse: 100  Temp: 98.3 F (36.8 C)  SpO2: 91%     Physical Exam Constitutional:      Appearance: She is obese.  HENT:     Nose: No congestion.     Mouth/Throat:     Mouth: Mucous membranes are moist.     Comments: Mallampati 4, crowded oropharynx, macroglossia Eyes:     General:        Right eye: No discharge.        Left eye: No discharge.  Cardiovascular:     Rate and Rhythm: Regular rhythm. Tachycardia present.     Pulses: Normal pulses.     Heart sounds: No murmur heard.  No friction rub.  Pulmonary:     Effort: Pulmonary effort is normal. No respiratory distress.     Breath sounds: Normal breath sounds. No stridor. No wheezing or rhonchi.  Musculoskeletal:     Cervical back: No rigidity or tenderness.  Neurological:     Mental Status: She is alert.  Psychiatric:        Mood  and Affect: Mood normal.    Data Reviewed: Previous sleep studies not available to be reviewed  Assessment:  Obstructive sleep apnea -Use an oral device -Has gained weight recently  Insomnia -Multiple agents have not helped in the past -Willing to try another sleep aid  Obesity  Pathophysiology of sleep disordered breathing discussed Treatment options discussed  Plan/Recommendations: Lunesta 2 mg nightly  Obtain a home sleep study with oral device in place  Encourage weight loss efforts  Encouraged graded exercise as tolerated  Risks with not adequately treating sleep disordered breathing discussed with the patient  Follow-up in 3 months   Virl Diamond MD [facility] 10/31/2020, 10:34 AM  CC: Sandford Craze, NP

## 2020-11-10 ENCOUNTER — Other Ambulatory Visit (INDEPENDENT_AMBULATORY_CARE_PROVIDER_SITE_OTHER): Payer: No Typology Code available for payment source

## 2020-11-10 DIAGNOSIS — Z1211 Encounter for screening for malignant neoplasm of colon: Secondary | ICD-10-CM

## 2020-11-10 LAB — FECAL OCCULT BLOOD, IMMUNOCHEMICAL: Fecal Occult Bld: NEGATIVE

## 2020-11-27 ENCOUNTER — Other Ambulatory Visit: Payer: Self-pay

## 2020-11-27 ENCOUNTER — Other Ambulatory Visit (HOSPITAL_COMMUNITY)
Admission: RE | Admit: 2020-11-27 | Discharge: 2020-11-27 | Disposition: A | Discharge status: Home / Self Care | Payer: No Typology Code available for payment source | Source: Ambulatory Visit | Attending: Family | Admitting: Family

## 2020-11-27 ENCOUNTER — Ambulatory Visit (INDEPENDENT_AMBULATORY_CARE_PROVIDER_SITE_OTHER): Payer: No Typology Code available for payment source | Admitting: Family

## 2020-11-27 ENCOUNTER — Encounter: Payer: Self-pay | Admitting: Family

## 2020-11-27 VITALS — BP 127/63 | HR 76 | Temp 98.2°F | Resp 16 | Ht 63.0 in | Wt 246.0 lb

## 2020-11-27 DIAGNOSIS — E785 Hyperlipidemia, unspecified: Secondary | ICD-10-CM

## 2020-11-27 DIAGNOSIS — Z0000 Encounter for general adult medical examination without abnormal findings: Secondary | ICD-10-CM

## 2020-11-27 DIAGNOSIS — Z01419 Encounter for gynecological examination (general) (routine) without abnormal findings: Secondary | ICD-10-CM

## 2020-11-27 LAB — COMPREHENSIVE METABOLIC PANEL
ALT: 26 U/L (ref 0–35)
AST: 23 U/L (ref 0–37)
Albumin: 4.4 g/dL (ref 3.5–5.2)
Alkaline Phosphatase: 111 U/L (ref 39–117)
BUN: 14 mg/dL (ref 6–23)
CO2: 29 mEq/L (ref 19–32)
Calcium: 9.1 mg/dL (ref 8.4–10.5)
Chloride: 104 mEq/L (ref 96–112)
Creatinine, Ser: 0.93 mg/dL (ref 0.40–1.20)
GFR: 65.44 mL/min (ref >60.00)
Glucose, Bld: 86 mg/dL (ref 70–99)
Potassium: 4.7 mEq/L (ref 3.5–5.1)
Sodium: 140 mEq/L (ref 135–145)
Total Bilirubin: 0.4 mg/dL (ref 0.2–1.2)
Total Protein: 7.2 g/dL (ref 6.0–8.3)

## 2020-11-27 LAB — LDL CHOLESTEROL, DIRECT: Direct LDL: 135 mg/dL

## 2020-11-27 LAB — LIPID PANEL
Cholesterol: 234 mg/dL — ABNORMAL HIGH (ref 0–200)
HDL: 62.1 mg/dL (ref >39.00)
NonHDL: 172.17
Total CHOL/HDL Ratio: 4
Triglycerides: 206 mg/dL — ABNORMAL HIGH (ref 0.0–149.0)
VLDL: 41.2 mg/dL — ABNORMAL HIGH (ref 0.0–40.0)

## 2020-11-27 NOTE — Progress Notes (Signed)
Subjective:    Patient ID: Beth [name], female    DOB: 08-09-1956, 64 y.o.   MRN: [id number]  HPI  Patient is a 64 yr old female who presents today for cpx.  Immunizations: Moderna x 2.  Flu shot up to date. Shingrix x 2.   Diet: healthy Exercise: some Colonoscopy: colon cancer screening is not covered by her insurance. She has opted to complete IFOB.  Dexa: due- declines due to cost Pap Smear: 3 years ago.  Would like to do today.   Mammogram: scheduled Vision: 2 years ago Dental:  Up to date.  Wt Readings from Last 3 Encounters:  11/27/20 246 lb (111.6 kg)  10/31/20 247 lb 9.6 oz (112.3 kg)  10/27/20 249 lb (112.9 kg)     Insomnia- no significant improvement with lunesta.      Review of Systems  Constitutional: Negative for unexpected weight change.  HENT: Negative for hearing loss and rhinorrhea.   Eyes: Negative for visual disturbance.  Respiratory: Negative for cough and shortness of breath.   Cardiovascular: Negative for chest pain.  Gastrointestinal: Positive for diarrhea (occasional post prandial diarrhea). Negative for blood in stool and constipation.  Genitourinary: Negative for dysuria, flank pain and hematuria.  Musculoskeletal: Negative for arthralgias and myalgias.  Skin: Negative for rash.  Neurological: Negative for headaches.  Hematological: Negative for adenopathy.  Psychiatric/Behavioral:       Denies depression/anxiety   Past Medical History:  Diagnosis Date  . Anxiety   . Arthritis   . Depression   . Sleep apnea    does not uses CPAP; uses mouth guard.      Social History   Socioeconomic History  . Marital status: Married    Spouse name: Ron  . Number of children: 2  . Years of education: Not on file  . Highest education level: Not on file  Occupational History  . Occupation: retired  Tobacco Use  . Smoking status: Former Smoker    Types: Cigarettes    Quit date: 07/22/1989    Years since quitting: 31.3  . Smokeless tobacco:  Never Used  Substance and Sexual Activity  . Alcohol use: Yes    Comment: social  . Drug use: Never  . Sexual activity: Yes    Partners: Male  Other Topics Concern  . Not on file  Social History Narrative   Retired Music therapist   Divorced (from [place] originally)   Remarried 2015 (husband is a IT trainer)   Has a dog   2 children (son and daughter) both married, daughter lives in [place], son in [place]. No grandchildren   Enjoys baking, sewing, reading   Social Determinants of Corporate investment banker Strain: Not on file  Food Insecurity: Not on file  Transportation Needs: Not on file  Physical Activity: Not on file  Stress: Not on file  Social Connections: Not on file  Intimate Partner Violence: Not on file    Past Surgical History:  Procedure Laterality Date  . ANKLE SURGERY Right 2008  . BACK SURGERY  2009  . REPLACEMENT TOTAL KNEE Right 2012  . TONSILLECTOMY    . TOTAL SHOULDER ARTHROPLASTY Right 03/28/2020   Procedure: RIGHT SHOULDER REPLACEMENT;  Surgeon: Cammy Copa, MD;  Location: [facility] OR;  Service: Orthopedics;  Laterality: Right;    Family History  Problem Relation Age of Onset  . Breast cancer Mother   . Diabetes Father   . Hydrocephalus Father     No Known Allergies  Current Outpatient Medications on File Prior to Visit  Medication Sig Dispense Refill  . b complex vitamins tablet Take 1 tablet by mouth daily.    Marland Kitchen CALCIUM PO Take 1 tablet by mouth daily.    . celecoxib (CELEBREX) 200 MG capsule TAKE ONE CAPSULE BY MOUTH ONE TIME DAILY 30 capsule 0  . Cholecalciferol (VITAMIN D) 50 MCG (2000 UT) CAPS Take 2,000 Units by mouth daily.    . eszopiclone (LUNESTA) 2 MG TABS tablet Take 1 tablet (2 mg total) by mouth at bedtime as needed for sleep. Take immediately before bedtime 30 tablet 2  . ferrous sulfate 325 (65 FE) MG tablet Take 325 mg by mouth daily with breakfast.    . Glucosamine HCl-MSM (GLUCOSAMINE-MSM PO) Take 2 tablets by mouth daily.    .  Multiple Minerals (CALCIUM-MAGNESIUM-ZINC) TABS Take 3 tablets by mouth daily.    Marland Kitchen rOPINIRole (REQUIP) 0.5 MG tablet Take 1 tablet (0.5 mg total) by mouth at bedtime. 90 tablet 1  . sertraline (ZOLOFT) 100 MG tablet Take 1 tablet (100 mg total) by mouth daily. 90 tablet 0  . triamcinolone (KENALOG) 0.1 % Apply 1 application topically 2 (two) times daily. To ear canals 30 g 0   No current facility-administered medications on file prior to visit.    BP 127/63 (BP Location: Right Arm, Patient Position: Sitting, Cuff Size: Large)   Pulse 76   Temp 98.2 F (36.8 C) (Oral)   Resp 16   Ht 5\' 3"  (1.6 m)   Wt 246 lb (111.6 kg)   SpO2 97%   BMI 43.58 kg/m       Objective:   Physical Exam  Physical Exam  Constitutional: She is oriented to person, place, and time. She appears well-developed and well-nourished. No distress.  HENT:  Head: Normocephalic and atraumatic.  Right Ear: Tympanic membrane and ear canal normal.  Left Ear: Tympanic membrane and ear canal normal.  Mouth/Throat: Not examined- pt wearing mask Eyes: Pupils are equal, round, and reactive to light. No scleral icterus.  Neck: Normal range of motion. No thyromegaly present.  Cardiovascular: Normal rate and regular rhythm.   No murmur heard. Pulmonary/Chest: Effort normal and breath sounds normal. No respiratory distress. He has no wheezes. She has no rales. She exhibits no tenderness.  Abdominal: Soft. Bowel sounds are normal. She exhibits no distension and no mass. There is no tenderness. There is no rebound and no guarding.  Musculoskeletal: She exhibits no edema.  Lymphadenopathy:    She has no cervical adenopathy.  Neurological: She is alert and oriented to person, place, and time. She has normal patellar reflexes. She exhibits normal muscle tone. Coordination normal.  Skin: Skin is warm and dry.  Psychiatric: She has a normal mood and affect. Her behavior is normal. Judgment and thought content normal.  Breasts:  Examined lying Right: Without masses, retractions, discharge or axillary adenopathy.  Left: Without masses, retractions, discharge or axillary adenopathy.  Inguinal/mons: Normal without inguinal adenopathy  External genitalia: Normal  BUS/Urethra/Skene's glands: Normal  Bladder: Normal  Vagina: Normal  Cervix: Normal  Uterus: normal in size, shape and contour. Midline and mobile  Adnexa/parametria:  Rt: Without masses or tenderness.  Lt: Without masses or tenderness.  Anus and perineum: Normal            Assessment & Plan:   Preventative care- discussed healthy diet, exercise and weight loss. Mammo- scheduled.  Immunizations reviewed and up to date.  Labs as ordered. Pap performed today.  Insomnia- remains uncontrolled. I will reach out to her sleep specialist to discuss other alternatives.  This visit occurred during the SARS-CoV-2 public health emergency.  Safety protocols were in place, including screening questions prior to the visit, additional usage of staff PPE, and extensive cleaning of exam room while observing appropriate contact time as indicated for disinfecting solutions.       Assessment & Plan:

## 2020-11-27 NOTE — Patient Instructions (Addendum)
Please work on adding walking 30 minutes 5 days a week. Please complete lab work prior to leaving.

## 2020-11-28 ENCOUNTER — Telehealth: Payer: Self-pay | Admitting: Family

## 2020-11-28 MED ORDER — QUETIAPINE FUMARATE 25 MG PO TABS: 25.0000 mg | ORAL_TABLET | Freq: Every day | ORAL | 1 refills | Status: DC

## 2020-11-28 NOTE — Telephone Encounter (Signed)
-----  Message from Laurin Coder, MD sent at 11/28/2020  7:11 AM EST ----- Hello there  Yes, okay to try seroquel and stop the Lunesta, bottom line is to get her on any agent that gets her some decent sleep  Adewale Olalere  ----- Message ----- From: Debbrah Alar, NP Sent: 11/27/2020   1:34 PM EST To: Laurin Coder, MD  Dear Dr. Ander Slade,  Thank you for your help with this mutual patient.  I met with her today and she told me that she is not having a lot of improvement in her sleep with the lunesta. She wishes to try seroquel for sleep. I am ok with this but wanted to coordinate with you as well.  Please let me know your thoughts.  Beth [name]

## 2020-11-29 ENCOUNTER — Telehealth: Payer: Self-pay | Admitting: Family

## 2020-11-29 LAB — CYTOLOGY - PAP
Adequacy: ABSENT
Comment: NEGATIVE
Diagnosis: NEGATIVE
High risk HPV: NEGATIVE

## 2020-11-29 MED ORDER — FLUCONAZOLE 150 MG PO TABS: ORAL_TABLET | ORAL | 0 refills | Status: DC

## 2020-11-29 NOTE — Telephone Encounter (Signed)
Pt is aware and voices understanding.  

## 2020-11-29 NOTE — Telephone Encounter (Signed)
Pap smear is normal but shows yeast infection. I have sent diflucan to her pharmacy to treat yeast infection.

## 2020-11-30 ENCOUNTER — Other Ambulatory Visit: Payer: Self-pay | Admitting: Surgical

## 2020-11-30 NOTE — Telephone Encounter (Signed)
Pls advise.  

## 2020-12-13 ENCOUNTER — Other Ambulatory Visit: Payer: Self-pay

## 2020-12-13 ENCOUNTER — Ambulatory Visit: Payer: No Typology Code available for payment source

## 2020-12-13 DIAGNOSIS — G4733 Obstructive sleep apnea (adult) (pediatric): Secondary | ICD-10-CM

## 2020-12-20 ENCOUNTER — Telehealth: Payer: Self-pay | Admitting: Pulmonary Disease

## 2020-12-20 DIAGNOSIS — G4733 Obstructive sleep apnea (adult) (pediatric): Secondary | ICD-10-CM

## 2020-12-20 NOTE — Telephone Encounter (Signed)
Call patient  Sleep study result  Date of study: 12/13/2020  Impression: Moderate obstructive sleep apnea mild oxygen desaturations  study was to be performed with oral device in place, presence of moderate obstructive sleep apnea with device in place means it is not adequately treated with oral device  Recommendation:  DME referral  Recommend CPAP therapy for moderate obstructive sleep apnea  Auto titrating CPAP with pressure settings of 5-15 will be appropriate  Encourage weight loss measures  Follow-up in the office 4 to 6 weeks following initiation of treatment

## 2020-12-21 DIAGNOSIS — G4733 Obstructive sleep apnea (adult) (pediatric): Secondary | ICD-10-CM

## 2020-12-21 NOTE — Telephone Encounter (Signed)
Already addresses via [employer] Will close message

## 2021-01-04 ENCOUNTER — Ambulatory Visit: Payer: No Typology Code available for payment source

## 2021-01-05 ENCOUNTER — Other Ambulatory Visit: Payer: Self-pay | Admitting: Family

## 2021-01-05 ENCOUNTER — Other Ambulatory Visit: Payer: Self-pay | Admitting: Surgical

## 2021-01-05 NOTE — Telephone Encounter (Signed)
Pls advise.  

## 2021-01-21 ENCOUNTER — Encounter: Payer: Self-pay | Admitting: Family

## 2021-01-24 ENCOUNTER — Ambulatory Visit: Payer: No Typology Code available for payment source | Admitting: Family

## 2021-01-31 ENCOUNTER — Other Ambulatory Visit: Payer: Self-pay

## 2021-01-31 ENCOUNTER — Ambulatory Visit (INDEPENDENT_AMBULATORY_CARE_PROVIDER_SITE_OTHER): Payer: No Typology Code available for payment source | Admitting: Family

## 2021-01-31 ENCOUNTER — Encounter: Payer: Self-pay | Admitting: Family

## 2021-01-31 VITALS — BP 121/55 | HR 88 | Temp 99.1°F | Resp 16 | Ht 64.0 in | Wt 244.6 lb

## 2021-01-31 DIAGNOSIS — R3129 Other microscopic hematuria: Secondary | ICD-10-CM | POA: Diagnosis not present

## 2021-01-31 DIAGNOSIS — R300 Dysuria: Secondary | ICD-10-CM | POA: Diagnosis not present

## 2021-01-31 LAB — URINALYSIS, ROUTINE W REFLEX MICROSCOPIC
Bilirubin Urine: NEGATIVE
Ketones, ur: NEGATIVE
Nitrite: NEGATIVE
Specific Gravity, Urine: <=1.005 — AB (ref 1.000–1.030)
Total Protein, Urine: NEGATIVE
Urine Glucose: NEGATIVE
Urobilinogen, UA: 0.2 (ref 0.0–1.0)
pH: 6.5 (ref 5.0–8.0)

## 2021-01-31 LAB — POC URINALSYSI DIPSTICK (AUTOMATED)
Bilirubin, UA: NEGATIVE
Glucose, UA: NEGATIVE
Ketones, UA: NEGATIVE
Nitrite, UA: NEGATIVE
Protein, UA: POSITIVE — AB
Spec Grav, UA: 1.01 (ref 1.010–1.025)
Urobilinogen, UA: NEGATIVE E.U./dL — AB
pH, UA: 6 (ref 5.0–8.0)

## 2021-01-31 NOTE — Progress Notes (Signed)
Subjective:    Patient ID: Beth [name], female    DOB: Apr 01, 1956, 65 y.o.   MRN: [id number]  HPI  Patient is a 65 yr old female who presents today to discuss dysuria.  Reports that symptoms began 2 weeks ago but improved with use of amoxicillin that she had on hand.     Review of Systems See HPI  Past Medical History:  Diagnosis Date  . Anxiety   . Arthritis   . Depression   . Sleep apnea    does not uses CPAP; uses mouth guard.      Social History   Socioeconomic History  . Marital status: Married    Spouse name: Ron  . Number of children: 2  . Years of education: Not on file  . Highest education level: Not on file  Occupational History  . Occupation: retired  Tobacco Use  . Smoking status: Former Smoker    Types: Cigarettes    Quit date: 07/22/1989    Years since quitting: 31.5  . Smokeless tobacco: Never Used  Substance and Sexual Activity  . Alcohol use: Yes    Comment: social  . Drug use: Never  . Sexual activity: Yes    Partners: Male  Other Topics Concern  . Not on file  Social History Narrative   Retired Music therapist   Divorced (from [place] originally)   Remarried 2015 (husband is a IT trainer)   Has a dog   2 children (son and daughter) both married, daughter lives in [place], son in [place]. No grandchildren   Enjoys baking, sewing, reading   Social Determinants of Corporate investment banker Strain: Not on file  Food Insecurity: Not on file  Transportation Needs: Not on file  Physical Activity: Not on file  Stress: Not on file  Social Connections: Not on file  Intimate Partner Violence: Not on file    Past Surgical History:  Procedure Laterality Date  . ANKLE SURGERY Right 2008  . BACK SURGERY  2009  . REPLACEMENT TOTAL KNEE Right 2012  . TONSILLECTOMY    . TOTAL SHOULDER ARTHROPLASTY Right 03/28/2020   Procedure: RIGHT SHOULDER REPLACEMENT;  Surgeon: Cammy Copa, MD;  Location: [facility] OR;  Service: Orthopedics;  Laterality: Right;     Family History  Problem Relation Age of Onset  . Breast cancer Mother 1  . Diabetes Father   . Hydrocephalus Father   . Tuberculosis Maternal Grandmother   . Alcohol abuse Maternal Grandfather        suicide  . Diabetes Mellitus II Paternal Grandmother   . Peripheral vascular disease Paternal Grandmother   . Lupus Maternal Aunt     No Known Allergies  Current Outpatient Medications on File Prior to Visit  Medication Sig Dispense Refill  . b complex vitamins tablet Take 1 tablet by mouth daily.    Marland Kitchen CALCIUM PO Take 1 tablet by mouth daily.    . celecoxib (CELEBREX) 200 MG capsule TAKE ONE CAPSULE BY MOUTH ONE TIME DAILY 30 capsule 0  . Cholecalciferol (VITAMIN D) 50 MCG (2000 UT) CAPS Take 2,000 Units by mouth daily.    . ferrous sulfate 325 (65 FE) MG tablet Take 325 mg by mouth daily with breakfast.    . fluconazole (DIFLUCAN) 150 MG tablet Take 1 tablet by mouth today for yeast infection. May repeat in 3 days if needed. 2 tablet 0  . Glucosamine HCl-MSM (GLUCOSAMINE-MSM PO) Take 2 tablets by mouth daily.    . Multiple  Minerals (CALCIUM-MAGNESIUM-ZINC) TABS Take 3 tablets by mouth daily.    . QUEtiapine (SEROQUEL) 25 MG tablet Take 1 tablet (25 mg total) by mouth at bedtime. 30 tablet 1  . rOPINIRole (REQUIP) 0.5 MG tablet Take 1 tablet (0.5 mg total) by mouth at bedtime. 90 tablet 1  . sertraline (ZOLOFT) 100 MG tablet Take 1 tablet (100 mg total) by mouth daily. 90 tablet 1  . triamcinolone (KENALOG) 0.1 % Apply 1 application topically 2 (two) times daily. To ear canals 30 g 0   No current facility-administered medications on file prior to visit.    BP (!) 121/55 (BP Location: Right Arm, Patient Position: Sitting, Cuff Size: Large)   Pulse 88   Temp 99.1 F (37.3 C) (Oral)   Resp 16   Ht 5\' 4"  (1.626 m)   Wt 244 lb 9.6 oz (110.9 kg)   SpO2 96%   BMI 41.99 kg/m       Objective:   Physical Exam Constitutional:      Appearance: She is well-developed and  well-nourished.  Cardiovascular:     Rate and Rhythm: Normal rate and regular rhythm.     Heart sounds: Normal heart sounds. No murmur heard.   Pulmonary:     Effort: Pulmonary effort is normal. No respiratory distress.     Breath sounds: Normal breath sounds. No wheezing.  Psychiatric:        Mood and Affect: Mood and affect normal.        Behavior: Behavior normal.        Thought Content: Thought content normal.        Judgment: Judgment normal.           Assessment & Plan:  Dysuria- s/p partial treatment with amoxicillin.  Will obtain follow up urine culture.  If + will plan additional abx.  Microscopic hematuria- plan to repeat Urinalysis in 1 month.  This visit occurred during the SARS-CoV-2 public health emergency.  Safety protocols were in place, including screening questions prior to the visit, additional usage of staff PPE, and extensive cleaning of exam room while observing appropriate contact time as indicated for disinfecting solutions.

## 2021-01-31 NOTE — Patient Instructions (Signed)
-   We will contact you with the results of your urine culture

## 2021-02-01 ENCOUNTER — Ambulatory Visit (INDEPENDENT_AMBULATORY_CARE_PROVIDER_SITE_OTHER): Payer: No Typology Code available for payment source

## 2021-02-01 DIAGNOSIS — Z0000 Encounter for general adult medical examination without abnormal findings: Secondary | ICD-10-CM | POA: Diagnosis not present

## 2021-02-01 LAB — URINE CULTURE
MICRO NUMBER:: 11569487
SPECIMEN QUALITY:: ADEQUATE

## 2021-02-02 ENCOUNTER — Other Ambulatory Visit: Payer: Self-pay | Admitting: Family

## 2021-02-02 ENCOUNTER — Encounter: Payer: Self-pay | Admitting: Family

## 2021-02-27 ENCOUNTER — Telehealth: Payer: Self-pay | Admitting: *Deleted

## 2021-02-27 DIAGNOSIS — R319 Hematuria, unspecified: Secondary | ICD-10-CM

## 2021-02-27 NOTE — Telephone Encounter (Signed)
Pt has lab appointment tomorrow but I do not see any future orders in Epic.  Please place orders if appropriate.

## 2021-02-27 NOTE — Telephone Encounter (Signed)
Order placed

## 2021-02-28 ENCOUNTER — Other Ambulatory Visit: Payer: Self-pay

## 2021-02-28 ENCOUNTER — Other Ambulatory Visit (INDEPENDENT_AMBULATORY_CARE_PROVIDER_SITE_OTHER): Payer: No Typology Code available for payment source

## 2021-02-28 DIAGNOSIS — R319 Hematuria, unspecified: Secondary | ICD-10-CM

## 2021-02-28 NOTE — Telephone Encounter (Signed)
Thank you :)

## 2021-03-01 LAB — URINALYSIS, ROUTINE W REFLEX MICROSCOPIC
Bilirubin Urine: NEGATIVE
Ketones, ur: NEGATIVE
Nitrite: POSITIVE — AB
Specific Gravity, Urine: 1.02 (ref 1.000–1.030)
Total Protein, Urine: NEGATIVE
Urine Glucose: NEGATIVE
Urobilinogen, UA: 0.2 (ref 0.0–1.0)
pH: 6 (ref 5.0–8.0)

## 2021-03-04 ENCOUNTER — Telehealth: Payer: Self-pay | Admitting: Family

## 2021-03-04 DIAGNOSIS — R3129 Other microscopic hematuria: Secondary | ICD-10-CM

## 2021-03-04 NOTE — Telephone Encounter (Signed)
Please advise pt that I reviewed her lab work and it shows that she still has blood in her urine. I would like to refer her to urology for further evaluation. Referral is pended.

## 2021-03-05 NOTE — Telephone Encounter (Signed)
Patient returned your call.. there was no one available. Please call her back

## 2021-03-05 NOTE — Telephone Encounter (Signed)
Patient advised of results and referral. Advised if she does not hear from urology in one week to let us know.

## 2021-03-05 NOTE — Telephone Encounter (Signed)
Called a few times but no answer, Lvm for patient to call back about her results.

## 2021-03-06 ENCOUNTER — Encounter: Payer: Self-pay | Admitting: Family

## 2021-03-07 ENCOUNTER — Other Ambulatory Visit: Payer: Self-pay

## 2021-03-07 DIAGNOSIS — R3129 Other microscopic hematuria: Secondary | ICD-10-CM

## 2021-03-08 ENCOUNTER — Other Ambulatory Visit: Payer: Self-pay | Admitting: Surgical

## 2021-04-10 ENCOUNTER — Other Ambulatory Visit: Payer: Self-pay | Admitting: Surgical

## 2021-04-10 NOTE — Telephone Encounter (Signed)
Recommend she discuss continuing this medication with her PCP so they can monitor in case she is at risk of CKD/CAD etc

## 2021-04-11 ENCOUNTER — Encounter: Payer: Self-pay | Admitting: Family

## 2021-04-11 MED ORDER — CELECOXIB 200 MG PO CAPS: 200.0000 mg | ORAL_CAPSULE | Freq: Every day | ORAL | 2 refills | Status: DC

## 2021-04-22 IMAGING — CT CT SHOULDER*R* W/O CM
2 series · 10 of 14 positions shown, 12 images · non-contrast
Comparison: CT right shoulder 01/19/2020.

CLINICAL DATA: Chronic right shoulder pain and limited range of
motion. No known injury.

EXAM:
CT OF THE UPPER RIGHT EXTREMITY WITHOUT CONTRAST
TECHNIQUE: Multidetector CT imaging of the upper right extremity was performed
according to the standard protocol.

[Series 2: bone · axial · 0.46mm/px · z∈[-241,-123]mm · 5 of 89 slices shown, 7 images]
[im 15/89  soft-tissue]
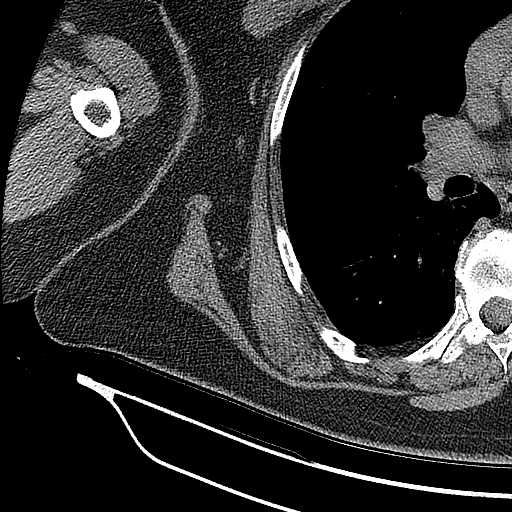
[im 15/89  bone]
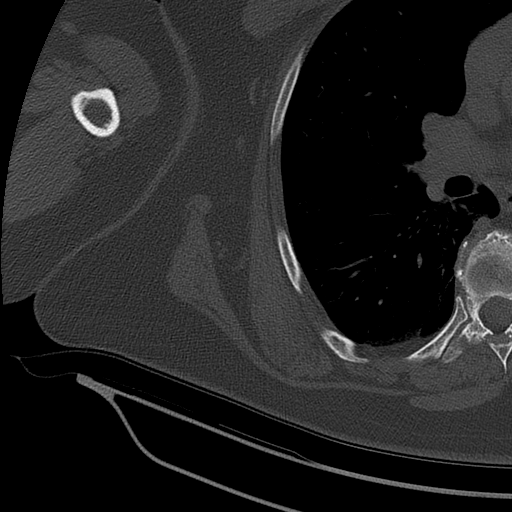
[im 30/89  bone]
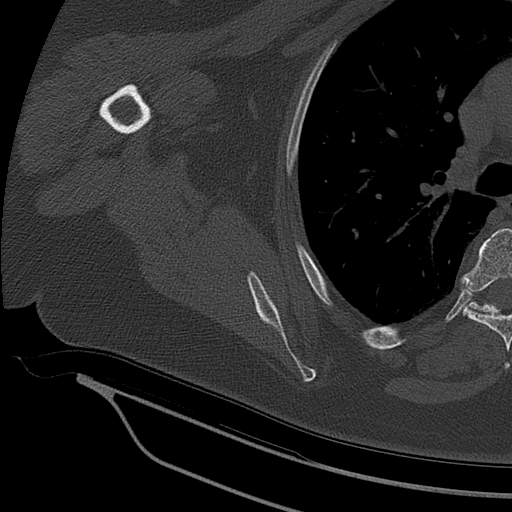
[im 45/89  bone]
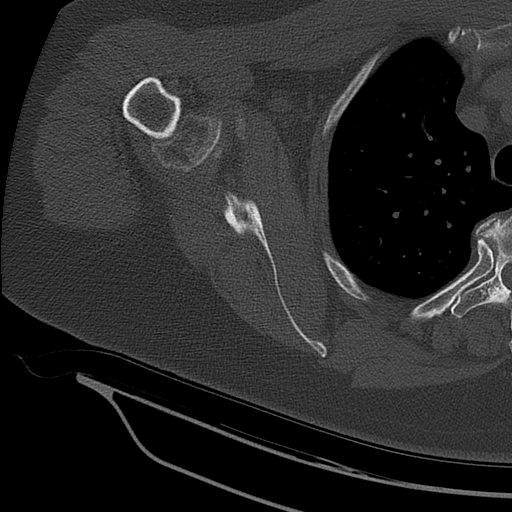
[im 59/89  bone]
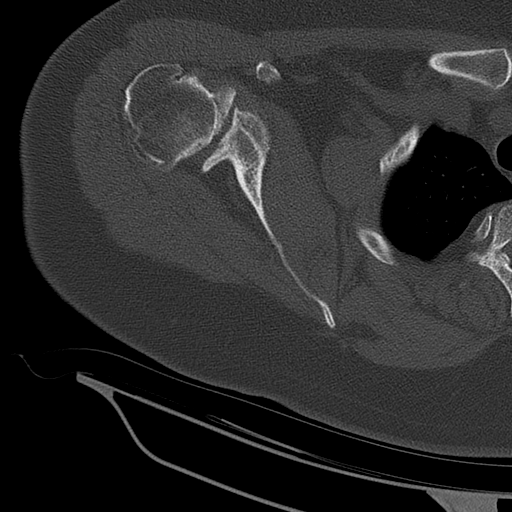
[im 74/89  soft-tissue]
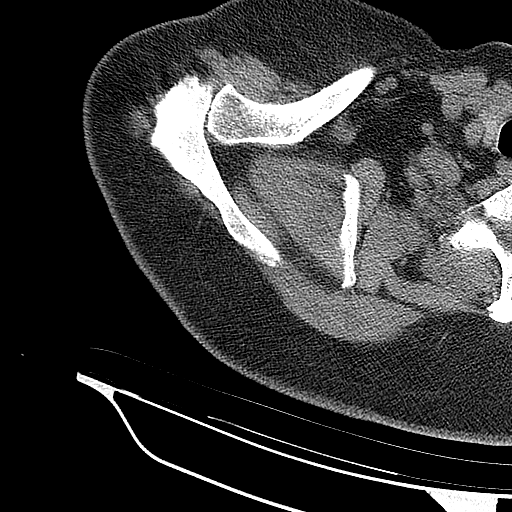
[im 74/89  bone]
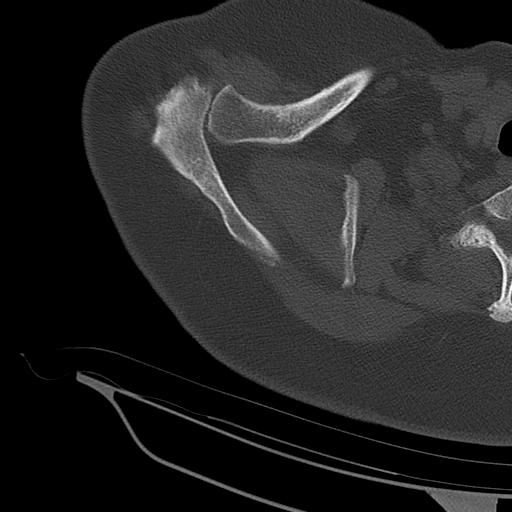

[Series 3: soft tissue · axial · 0.46mm/px · z∈[-235,-123]mm · 5 of 86 slices shown]
[im 15/86  soft-tissue]
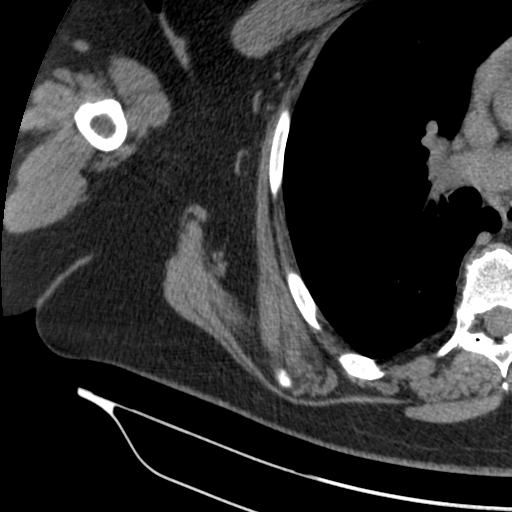
[im 29/86  soft-tissue]
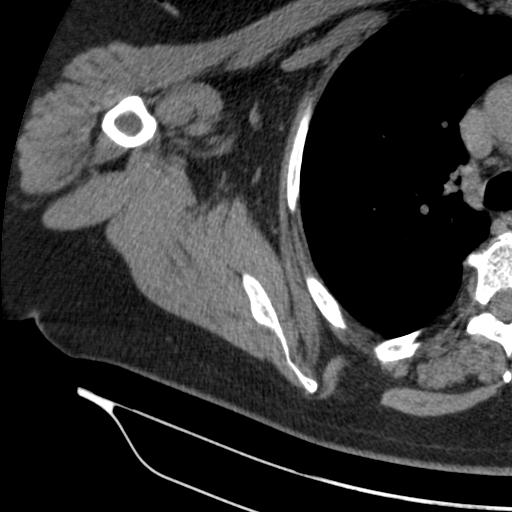
[im 43/86  soft-tissue]
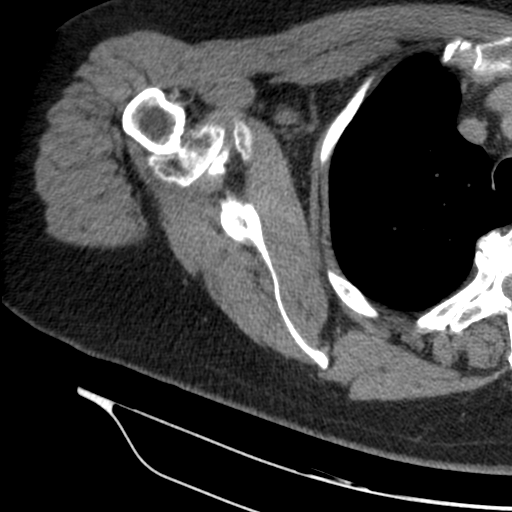
[im 57/86  soft-tissue]
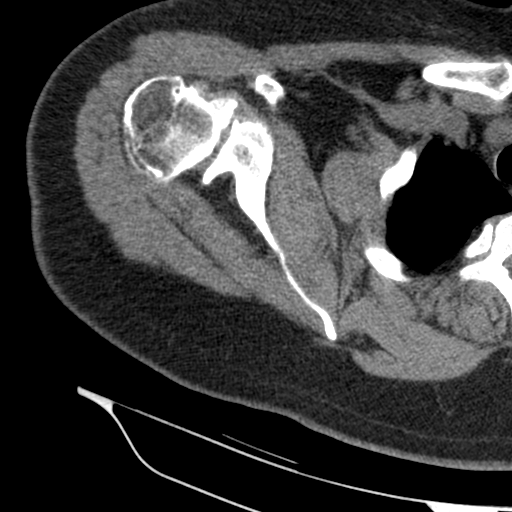
[im 71/86  soft-tissue]
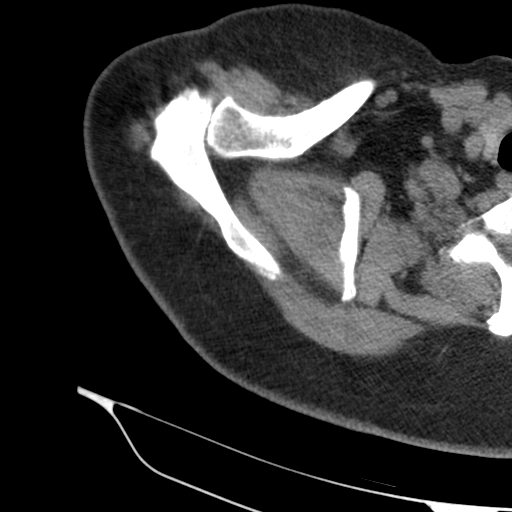

[10 of 14 positions shown; findings below may reference images not displayed]

FINDINGS: Bones/Joint/Cartilage

Again seen is advanced glenohumeral osteoarthritis with bulky
osteophytosis of both the superior and inferior margins of the
humeral head and anterior lip of the glenoid. There is some
subchondral sclerosis in the posterior glenoid. No subchondral cyst
is identified. No worrisome bony lesion is seen. No loose body in
the joint. No joint effusion.

The patient has mild acromioclavicular degenerative change. The
acromion is type 1 with some subacromial spurring.

Ligaments

Suboptimally assessed by CT.

Muscles and Tendons

Musculature of the shoulder girdle is preserved. The rotator cuffs
appears intact.

Soft tissues

Imaged lung parenchyma is clear. No soft tissue abnormality is
identified.
IMPRESSION: Severe glenohumeral osteoarthritis.

Mild acromioclavicular degenerative change. Type 1 acromion with
some subacromial spurring also noted.

## 2021-05-06 ENCOUNTER — Other Ambulatory Visit: Payer: Self-pay | Admitting: Family

## 2021-05-28 ENCOUNTER — Ambulatory Visit: Payer: No Typology Code available for payment source | Admitting: Family

## 2021-05-29 ENCOUNTER — Ambulatory Visit: Payer: No Typology Code available for payment source | Admitting: Family

## 2021-06-01 ENCOUNTER — Other Ambulatory Visit: Payer: Self-pay

## 2021-06-01 ENCOUNTER — Ambulatory Visit (INDEPENDENT_AMBULATORY_CARE_PROVIDER_SITE_OTHER): Payer: No Typology Code available for payment source | Admitting: Family

## 2021-06-01 VITALS — BP 135/65 | HR 79 | Temp 98.7°F | Resp 16 | Ht 64.0 in | Wt 249.0 lb

## 2021-06-01 DIAGNOSIS — F32A Depression, unspecified: Secondary | ICD-10-CM

## 2021-06-01 DIAGNOSIS — G4700 Insomnia, unspecified: Secondary | ICD-10-CM

## 2021-06-01 DIAGNOSIS — G4733 Obstructive sleep apnea (adult) (pediatric): Secondary | ICD-10-CM

## 2021-06-01 DIAGNOSIS — G2581 Restless legs syndrome: Secondary | ICD-10-CM | POA: Diagnosis not present

## 2021-06-01 MED ORDER — QUETIAPINE FUMARATE 25 MG PO TABS: 25.0000 mg | ORAL_TABLET | Freq: Every day | ORAL | 1 refills | Status: DC

## 2021-06-01 MED ORDER — PRAMIPEXOLE DIHYDROCHLORIDE 0.125 MG PO TABS: ORAL_TABLET | ORAL | 1 refills | Status: DC

## 2021-06-01 NOTE — Progress Notes (Signed)
Subjective:   By signing my name below, I, Shehryar Baig, attest that this documentation has been prepared under the direction and in the presence of Sandford Craze NP. 06/01/2021      Patient ID: Beth [name], female    DOB: 08/01/1956, 65 y.o.   MRN: [id number]  Chief Complaint  Patient presents with   Insomnia    Here for follow up, "doing well"   Sleep Apnea    Here for follow up    Insomnia  Patient is in today for a office visit.   Insomnia- Her insomnia has improved since she started taking OTC tylenol PM, 25 mg Seroquel daily PO, and 1.5 mg Requip daily PO. Sleep apnea- She has completed a sleep study but was not given the results. She last talked with them discussing whether her insurance will pay for the CPAP.  She continues not feeling well rested after sleeping and finds it difficult to wake up while sleeping.  Restless leg syndrome- She finds mild improvement while on 1.5 mg Requip daily PO. She is interested in trying another medication that would me more effective in managing her symptoms.  Depression- Her mood has improved while taking 100 mg Zoloft daily PO. Celebrex- She continues taking 200 mg Celebrex daily PO to manage the pain in her knees and hips. Colonoscopy- It has been more than 10 years since she last had a colonoscopy. She is willing to make an appointment after she gets medicare. She has done an IFOB test which was negative. Current insurance does not cover colonoscopy or cologuard.   Immunizations- She is not interested in getting a HIV or hepatitis C screening at this time. She has 3 Covid-19 vaccines at this time.  Health Maintenance Due  Topic Date Due   Pneumococcal Vaccine 30-18 Years old (1 - PCV) Never done   COLONOSCOPY (Pts 45-36yrs Insurance coverage will need to be confirmed)  Never done   COVID-19 Vaccine (4 - Booster for Moderna series) 04/08/2021    Past Medical History:  Diagnosis Date   Anxiety    Arthritis    Depression     Sleep apnea    does not uses CPAP; uses mouth guard.     Past Surgical History:  Procedure Laterality Date   ANKLE SURGERY Right 2008   BACK SURGERY  2009   REPLACEMENT TOTAL KNEE Right 2012   TONSILLECTOMY     TOTAL SHOULDER ARTHROPLASTY Right 03/28/2020   Procedure: RIGHT SHOULDER REPLACEMENT;  Surgeon: Cammy Copa, MD;  Location: [facility] OR;  Service: Orthopedics;  Laterality: Right;    Family History  Problem Relation Age of Onset   Breast cancer Mother 66   Diabetes Father    Hydrocephalus Father    Tuberculosis Maternal Grandmother    Alcohol abuse Maternal Grandfather        suicide   Diabetes Mellitus II Paternal Grandmother    Peripheral vascular disease Paternal Grandmother    Lupus Maternal Aunt     Social History   Socioeconomic History   Marital status: Married    Spouse name: Ron   Number of children: 2   Years of education: Not on file   Highest education level: Not on file  Occupational History   Occupation: retired  Tobacco Use   Smoking status: Former    Pack years: 0.00    Types: Cigarettes    Quit date: 07/22/1989    Years since quitting: 31.8   Smokeless tobacco: Never  Substance and  Sexual Activity   Alcohol use: Yes    Comment: social   Drug use: Never   Sexual activity: Yes    Partners: Male  Other Topics Concern   Not on file  Social History Narrative   Retired Music therapist   Divorced (from [place] originally)   Remarried 2015 (husband is a IT trainer)   Has a dog   2 children (son and daughter) both married, daughter lives in [place], son in [place]. No grandchildren   Enjoys baking, sewing, reading   Social Determinants of Corporate investment banker Strain: Not on file  Food Insecurity: Not on file  Transportation Needs: Not on file  Physical Activity: Not on file  Stress: Not on file  Social Connections: Not on file  Intimate Partner Violence: Not on file    Outpatient Medications Prior to Visit  Medication Sig Dispense Refill    b complex vitamins tablet Take 1 tablet by mouth daily.     CALCIUM PO Take 1 tablet by mouth daily.     celecoxib (CELEBREX) 200 MG capsule Take 1 capsule (200 mg total) by mouth daily. 30 capsule 2   Cholecalciferol (VITAMIN D) 50 MCG (2000 UT) CAPS Take 2,000 Units by mouth daily.     ferrous sulfate 325 (65 FE) MG tablet Take 325 mg by mouth daily with breakfast.     Glucosamine HCl-MSM (GLUCOSAMINE-MSM PO) Take 2 tablets by mouth daily.     Multiple Minerals (CALCIUM-MAGNESIUM-ZINC) TABS Take 3 tablets by mouth daily.     sertraline (ZOLOFT) 100 MG tablet Take 1 tablet (100 mg total) by mouth daily. 90 tablet 1   triamcinolone (KENALOG) 0.1 % Apply 1 application topically 2 (two) times daily. To ear canals 30 g 0   QUEtiapine (SEROQUEL) 25 MG tablet Take 1 tablet (25 mg total) by mouth at bedtime. 90 tablet 1   rOPINIRole (REQUIP) 0.5 MG tablet TAKE ONE TABLET BY MOUTH DAILY AT BEDTIME 90 tablet 0   fluconazole (DIFLUCAN) 150 MG tablet Take 1 tablet by mouth today for yeast infection. May repeat in 3 days if needed. 2 tablet 0   No facility-administered medications prior to visit.    No Known Allergies  Review of Systems  Psychiatric/Behavioral:  The patient has insomnia.        (+)feeling tired after sleep      Objective:    Physical Exam Constitutional:      General: She is not in acute distress.    Appearance: Normal appearance. She is not ill-appearing.  HENT:     Head: Normocephalic and atraumatic.     Right Ear: External ear normal.     Left Ear: External ear normal.  Eyes:     Extraocular Movements: Extraocular movements intact.     Pupils: Pupils are equal, round, and reactive to light.  Cardiovascular:     Rate and Rhythm: Normal rate and regular rhythm.     Pulses: Normal pulses.     Heart sounds: Normal heart sounds. No murmur heard.   No gallop.  Pulmonary:     Effort: Pulmonary effort is normal. No respiratory distress.     Breath sounds: Normal breath  sounds. No wheezing, rhonchi or rales.  Skin:    General: Skin is warm and dry.  Neurological:     Mental Status: She is alert and oriented to person, place, and time.  Psychiatric:        Behavior: Behavior normal.    BP 135/65 (BP  Location: Right Arm, Patient Position: Sitting, Cuff Size: Large)   Pulse 79   Temp 98.7 F (37.1 C) (Oral)   Resp 16   Ht 5\' 4"  (1.626 m)   Wt 249 lb (112.9 kg)   SpO2 98%   BMI 42.74 kg/m  Wt Readings from Last 3 Encounters:  06/01/21 249 lb (112.9 kg)  01/31/21 244 lb 9.6 oz (110.9 kg)  11/27/20 246 lb (111.6 kg)       Assessment & Plan:  Recommended that she get her 2nd COVID booster.  Problem List Items Addressed This Visit       Unprioritized   Restless leg - Primary    Uncontrolled. She has been taking requip 1.5mg  nightly. Will try mirapex.         Relevant Medications   pramipexole (MIRAPEX) 0.125 MG tablet   OSA (obstructive sleep apnea)    Notes extreme fatigue in the AM's. I suspect that this is due to untreated sleep apnea. I advised her to get back in touch with the DME company to check status and if needed, reach back out to ordering provider.        Insomnia    Stable, improved. Continue seroquel 25mg  PO HS.        Depression    Stable on zoloft 100mg  once daily, continue same.          Meds ordered this encounter  Medications   QUEtiapine (SEROQUEL) 25 MG tablet    Sig: Take 1 tablet (25 mg total) by mouth at bedtime.    Dispense:  90 tablet    Refill:  1    Order Specific Question:   Supervising Provider    Answer:   11/29/20 A [[id number]]   pramipexole (MIRAPEX) 0.125 MG tablet    Sig: Take 1 tab by mouth 2-3 hrs before bedtime.  May increase to 2 tabs by mouth on week 2 if needed.    Dispense:  60 tablet    Refill:  1    Order Specific Question:   Supervising Provider    Answer:   A [[id number]]    I, NP personally preformed the services described in this documentation.   All medical record entries made by the scribe were at my direction and in my presence.  I have reviewed the chart and discharge instructions (if applicable) and agree that the record reflects my personal performance and is accurate and complete. 06/01/2021   I,Shehryar Baig,acting as a Danise Edge for Sandford Craze, NP.,have documented all relevant documentation on the behalf of 06/03/2021, NP,as directed by  Neurosurgeon, NP while in the presence of Lemont Fillers, NP.    Lemont Fillers, NP

## 2021-06-01 NOTE — Assessment & Plan Note (Signed)
Notes extreme fatigue in the AM's. I suspect that this is due to untreated sleep apnea. I advised her to get back in touch with the DME company to check status and if needed, reach back out to ordering provider.

## 2021-06-01 NOTE — Assessment & Plan Note (Signed)
Stable on zoloft 100mg  once daily, continue same.

## 2021-06-01 NOTE — Patient Instructions (Addendum)
Please call [employer] to check the status of the CPAP.  [phone] Stop requip and begin pramipexole for restless leg.

## 2021-06-01 NOTE — Assessment & Plan Note (Signed)
Uncontrolled. She has been taking requip 1.5mg  nightly. Will try mirapex.

## 2021-06-01 NOTE — Assessment & Plan Note (Signed)
Stable, improved. Continue seroquel 25mg  PO HS.

## 2021-06-06 ENCOUNTER — Other Ambulatory Visit: Payer: Self-pay

## 2021-06-06 ENCOUNTER — Telehealth: Payer: No Typology Code available for payment source | Admitting: Family

## 2021-07-10 ENCOUNTER — Telehealth (INDEPENDENT_AMBULATORY_CARE_PROVIDER_SITE_OTHER): Payer: No Typology Code available for payment source | Admitting: Family

## 2021-07-10 ENCOUNTER — Other Ambulatory Visit: Payer: Self-pay

## 2021-07-10 DIAGNOSIS — G2581 Restless legs syndrome: Secondary | ICD-10-CM | POA: Diagnosis not present

## 2021-07-10 MED ORDER — PRAMIPEXOLE DIHYDROCHLORIDE 0.25 MG PO TABS
0.2500 mg | ORAL_TABLET | Freq: Every evening | ORAL | 1 refills | Status: DC
Start: 2021-07-10 — End: 2022-01-30

## 2021-07-10 NOTE — Assessment & Plan Note (Signed)
Stable/improved on mirapex 0.25mg  nightly. Will continue at this dose.

## 2021-07-10 NOTE — Progress Notes (Signed)
MyChart Video Visit    Virtual Visit via Video Note   This visit type was conducted due to national recommendations for restrictions regarding the COVID-19 Pandemic (e.g. social distancing) in an effort to limit this patient's exposure and mitigate transmission in our community. This patient is at least at moderate risk for complications without adequate follow up. This format is felt to be most appropriate for this patient at this time. Physical exam was limited by quality of the video and audio technology used for the visit. CMA was able to get the patient set up on a video visit.  Patient location: Home. Patient and provider in visit Provider location: Office  I discussed the limitations of evaluation and management by telemedicine and the availability of in person appointments. The patient expressed understanding and agreed to proceed.  Visit Date: 07/10/2021  Today's healthcare provider: Lemont Fillers, NP     Subjective:    Patient ID: Beth [name], female    DOB: [birth year], 65 y.o.   MRN: [id number]  Chief Complaint  Patient presents with   Restless legs syndrome    Follow up, "doing really good on new medication"    HPI  Patient is a 65 yr old female who presents today for follow up.  Last visit she described uncontrolled RLS despite regular use of Requip.  We discontinued Requip and instead gave her trial of Mirapex. She increased to 2 tabs last week and this helped dramatically.    Past Medical History:  Diagnosis Date   Anxiety    Arthritis    Depression    Sleep apnea    does not uses CPAP; uses mouth guard.     Past Surgical History:  Procedure Laterality Date   ANKLE SURGERY Right 2008   BACK SURGERY  2009   REPLACEMENT TOTAL KNEE Right 2012   TONSILLECTOMY     TOTAL SHOULDER ARTHROPLASTY Right 03/28/2020   Procedure: RIGHT SHOULDER REPLACEMENT;  Surgeon: Cammy Copa, MD;  Location: [facility] OR;  Service: Orthopedics;  Laterality: Right;     Family History  Problem Relation Age of Onset   Breast cancer Mother 75   Diabetes Father    Hydrocephalus Father    Tuberculosis Maternal Grandmother    Alcohol abuse Maternal Grandfather        suicide   Diabetes Mellitus II Paternal Grandmother    Peripheral vascular disease Paternal Grandmother    Lupus Maternal Aunt     Social History   Socioeconomic History   Marital status: Married    Spouse name: Ron   Number of children: 2   Years of education: Not on file   Highest education level: Not on file  Occupational History   Occupation: retired  Tobacco Use   Smoking status: Former    Types: Cigarettes    Quit date: 07/22/1989    Years since quitting: 31.9   Smokeless tobacco: Never  Substance and Sexual Activity   Alcohol use: Yes    Comment: social   Drug use: Never   Sexual activity: Yes    Partners: Male  Other Topics Concern   Not on file  Social History Narrative   Retired Music therapist   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 No grandchildren   Enjoys baking, sewing, reading   Social Determinants of Health  Financial Resource Strain: Not on file  Food Insecurity: Not on file  Transportation Needs: Not on file  Physical Activity: Not on file  Stress: Not on file  Social Connections: Not on file  Intimate Partner Violence: Not on file    Outpatient Medications Prior to Visit  Medication Sig Dispense Refill   b complex vitamins tablet Take 1 tablet by mouth daily.     CALCIUM PO Take 1 tablet by mouth daily.     celecoxib (CELEBREX) 200 MG capsule Take 1 capsule (200 mg total) by mouth daily. 30 capsule 2   Cholecalciferol (VITAMIN D) 50 MCG (2000 UT) CAPS Take 2,000 Units by mouth daily.     ferrous sulfate 325 (65 FE) MG tablet Take 325 mg by mouth daily with breakfast.     Glucosamine HCl-MSM (GLUCOSAMINE-MSM PO)  Take 2 tablets by mouth daily.     Multiple Minerals (CALCIUM-MAGNESIUM-ZINC) TABS Take 3 tablets by mouth daily.     QUEtiapine (SEROQUEL) 25 MG tablet Take 1 tablet (25 mg total) by mouth at bedtime. 90 tablet 1   sertraline (ZOLOFT) 100 MG tablet Take 1 tablet (100 mg total) by mouth daily. 90 tablet 1   triamcinolone (KENALOG) 0.1 % Apply 1 application topically 2 (two) times daily. To ear canals 30 g 0   pramipexole (MIRAPEX) 0.125 MG tablet Take 1 tab by mouth 2-3 hrs before bedtime.  May increase to 2 tabs by mouth on week 2 if needed. 60 tablet 1   No facility-administered medications prior to visit.    No Known Allergies  ROS     Objective:    Physical Exam  There were no vitals taken for this visit. Wt Readings from Last 3 Encounters:  06/01/21 249 lb (112.9 kg)  01/31/21 244 lb 9.6 oz (110.9 kg)  11/27/20 246 lb (111.6 kg)    Gen: Awake, alert, no acute distress Resp: Breathing is even and non-labored Psych: calm/pleasant demeanor Neuro: Alert and Oriented x 3, + facial symmetry, speech is clear.     Assessment & Plan:   Problem List Items Addressed This Visit       Unprioritized   Restless leg    Stable/improved on mirapex 0.25mg  nightly. Will continue at this dose.        I have discontinued Avaleigh Grabert's pramipexole. I am also having her start on pramipexole. Additionally, I am having her maintain her Glucosamine HCl-MSM (GLUCOSAMINE-MSM PO), Calcium-Magnesium-Zinc, ferrous sulfate, b complex vitamins, Vitamin D, CALCIUM PO, triamcinolone cream, sertraline, celecoxib, and QUEtiapine.  Meds ordered this encounter  Medications   pramipexole (MIRAPEX) 0.25 MG tablet    Sig: Take 1 tablet (0.25 mg total) by mouth at bedtime.    Dispense:  90 tablet    Refill:  1    Order Specific Question:   Supervising Provider    Answer:   Danise Edge A [[id number]]    I discussed the assessment and treatment plan with the patient. The patient was provided an  opportunity to ask questions and all were answered. The patient agreed with the plan and demonstrated an understanding of the instructions.   The patient was advised to call back or seek an in-person evaluation if the symptoms worsen or if the condition fails to improve as anticipated.   Lemont Fillers, NP [employer] [facility] [employer] [phone] (phone) [phone] (fax)  [facility]

## 2021-07-21 ENCOUNTER — Encounter: Payer: Self-pay | Admitting: Family

## 2021-07-31 ENCOUNTER — Other Ambulatory Visit: Payer: Self-pay | Admitting: Family

## 2021-08-22 ENCOUNTER — Other Ambulatory Visit: Payer: Self-pay | Admitting: Family

## 2021-08-25 ENCOUNTER — Other Ambulatory Visit: Payer: Self-pay | Admitting: Family

## 2021-10-06 ENCOUNTER — Other Ambulatory Visit: Payer: Self-pay | Admitting: Family

## 2021-11-08 ENCOUNTER — Telehealth: Payer: Self-pay | Admitting: Family

## 2021-11-08 NOTE — Telephone Encounter (Signed)
Per patient she never used this pharmacy before and she was trying to get her medications deliver with [facility]. She said she does not need any medications for at least 2 months. They will put a hold on this and patient call call them back if she decides to get medication with them in the future.

## 2021-11-08 NOTE — Telephone Encounter (Signed)
[name] [phone] contacted [place] needing verbal orders for medication rOPINIRole (REQUIP) 0.5 MG tablet [[id number] she stated the pt needs it as soon as possible, and would like for someone to contact the number above and ask to speak with the pharmacist. Please advise.

## 2021-11-08 NOTE — Telephone Encounter (Signed)
[facility] called back and stated they needed verbal orders or a fax additionally for the medications below. Phone: [phone] [phone]  celecoxib (CELEBREX) 200 MG capsule   sertraline (ZOLOFT) 100 MG tablet

## 2021-11-21 ENCOUNTER — Other Ambulatory Visit: Payer: Self-pay | Admitting: Family

## 2022-01-02 ENCOUNTER — Other Ambulatory Visit: Payer: Self-pay | Admitting: Family

## 2022-01-15 ENCOUNTER — Ambulatory Visit: Payer: No Typology Code available for payment source | Attending: Internal Medicine

## 2022-01-15 ENCOUNTER — Ambulatory Visit (INDEPENDENT_AMBULATORY_CARE_PROVIDER_SITE_OTHER): Payer: No Typology Code available for payment source | Admitting: Family

## 2022-01-15 ENCOUNTER — Telehealth: Payer: Self-pay | Admitting: Family

## 2022-01-15 VITALS — BP 128/56 | HR 78 | Temp 98.2°F | Resp 16 | Ht 64.0 in | Wt 259.0 lb

## 2022-01-15 DIAGNOSIS — M25562 Pain in left knee: Secondary | ICD-10-CM

## 2022-01-15 DIAGNOSIS — G5603 Carpal tunnel syndrome, bilateral upper limbs: Secondary | ICD-10-CM | POA: Diagnosis not present

## 2022-01-15 DIAGNOSIS — Z23 Encounter for immunization: Secondary | ICD-10-CM

## 2022-01-15 DIAGNOSIS — G2581 Restless legs syndrome: Secondary | ICD-10-CM

## 2022-01-15 DIAGNOSIS — G4733 Obstructive sleep apnea (adult) (pediatric): Secondary | ICD-10-CM | POA: Diagnosis not present

## 2022-01-15 DIAGNOSIS — G8929 Other chronic pain: Secondary | ICD-10-CM | POA: Insufficient documentation

## 2022-01-15 DIAGNOSIS — G4700 Insomnia, unspecified: Secondary | ICD-10-CM

## 2022-01-15 NOTE — Assessment & Plan Note (Signed)
Notes daytime somnolence. We discussed that this is likely secondary to her untreated OSA. She is anxious to begin cpap once she has coverage.

## 2022-01-15 NOTE — Assessment & Plan Note (Signed)
Overall improved. She continues seroquel. Monitor.

## 2022-01-15 NOTE — Assessment & Plan Note (Signed)
New. Recommended trial of otc wrist splints. If symptoms worsen or fail to improve, plan referral to Orthopedics.

## 2022-01-15 NOTE — Progress Notes (Signed)
Subjective:     Patient ID: Beth [name], female    DOB: January 10, 1956, 66 y.o.   MRN: [id number]  Chief Complaint  Patient presents with   Insomnia    Complains of still having trouble falling sleep some nights.    Restless leg     Here for follow up, "doing better"    Numbness    Complains of having numbness of both hands in the past 4-5 months    HPI Patient is in today for follow up.  Insomnia- reports that she still has some issues falling asleep some nights. Most nights she falls asleep OK.    She wishes to get a cpap when she gets on medicare. Unfortunately, her current health insurance will not cover cpap or preventative care screenings. She will be placed on medicare in July 2023.   RLS- improved on mirapex.   Left knee pain- has been bothering her "for a while."  Sometimes it "gives out." Keeps her up sometime.    Stress incontinence- once she is on medicare, she reports that she would be interested in bladder sling procedure.   Bilateral hand numbness- reports that this has been present x 4-5 months. Notes it especially if she is trying to hold a book up.    Health Maintenance Due  Topic Date Due   COLONOSCOPY (Pts 45-85yrs Insurance coverage will need to be confirmed)  Never done   TETANUS/TDAP  09/16/2021    Past Medical History:  Diagnosis Date   Anxiety    Arthritis    Depression    Sleep apnea    does not uses CPAP; uses mouth guard.     Past Surgical History:  Procedure Laterality Date   ANKLE SURGERY Right 2008   BACK SURGERY  2009   REPLACEMENT TOTAL KNEE Right 2012   TONSILLECTOMY     TOTAL SHOULDER ARTHROPLASTY Right 03/28/2020   Procedure: RIGHT SHOULDER REPLACEMENT;  Surgeon: Cammy Copa, MD;  Location: [facility] OR;  Service: Orthopedics;  Laterality: Right;    Family History  Problem Relation Age of Onset   Breast cancer Mother 11   Diabetes Father    Hydrocephalus Father    Tuberculosis Maternal Grandmother    Alcohol abuse  Maternal Grandfather        suicide   Diabetes Mellitus II Paternal Grandmother    Peripheral vascular disease Paternal Grandmother    Lupus Maternal Aunt     Social History   Socioeconomic History   Marital status: Married    Spouse name: Ron   Number of children: 2   Years of education: Not on file   Highest education level: Not on file  Occupational History   Occupation: retired  Tobacco Use   Smoking status: Former    Types: Cigarettes    Quit date: 07/22/1989    Years since quitting: 32.5   Smokeless tobacco: Never  Substance and Sexual Activity   Alcohol use: Yes    Comment: social   Drug use: Never   Sexual activity: Yes    Partners: Male  Other Topics Concern   Not on file  Social History Narrative   Retired Music therapist   Divorced (from [place] originally)   Remarried 2015 (husband is a IT trainer)   Has a dog   2 children (son and daughter) both married, daughter lives in [place], son in [place]. No grandchildren   Enjoys baking, sewing, reading   Social Determinants of Health   Financial Resource Strain: Not  on file  Food Insecurity: Not on file  Transportation Needs: Not on file  Physical Activity: Not on file  Stress: Not on file  Social Connections: Not on file  Intimate Partner Violence: Not on file    Outpatient Medications Prior to Visit  Medication Sig Dispense Refill   b complex vitamins tablet Take 1 tablet by mouth daily.     CALCIUM PO Take 1 tablet by mouth daily.     celecoxib (CELEBREX) 200 MG capsule TAKE ONE CAPSULE BY MOUTH ONE TIME DAILY 30 capsule 0   Cholecalciferol (VITAMIN D) 50 MCG (2000 UT) CAPS Take 2,000 Units by mouth daily.     ferrous sulfate 325 (65 FE) MG tablet Take 325 mg by mouth daily with breakfast.     Glucosamine HCl-MSM (GLUCOSAMINE-MSM PO) Take 2 tablets by mouth daily.     Multiple Minerals (CALCIUM-MAGNESIUM-ZINC) TABS Take 3 tablets by mouth daily.     pramipexole (MIRAPEX) 0.25 MG tablet Take 1 tablet (0.25 mg total)  by mouth at bedtime. 90 tablet 1   QUEtiapine (SEROQUEL) 25 MG tablet Take 1 tablet (25 mg total) by mouth at bedtime. 90 tablet 1   sertraline (ZOLOFT) 100 MG tablet TAKE ONE TABLET BY MOUTH ONE TIME DAILY 90 tablet 0   triamcinolone (KENALOG) 0.1 % Apply 1 application topically 2 (two) times daily. To ear canals 30 g 0   No facility-administered medications prior to visit.    No Known Allergies  ROS     Objective:    Physical Exam Constitutional:      General: She is not in acute distress.    Appearance: Normal appearance. She is well-developed.  HENT:     Head: Normocephalic and atraumatic.     Right Ear: External ear normal.     Left Ear: External ear normal.  Eyes:     General: No scleral icterus. Neck:     Thyroid: No thyromegaly.  Cardiovascular:     Rate and Rhythm: Normal rate and regular rhythm.     Heart sounds: Normal heart sounds. No murmur heard. Pulmonary:     Effort: Pulmonary effort is normal. No respiratory distress.     Breath sounds: Normal breath sounds. No wheezing.  Musculoskeletal:     Cervical back: Neck supple.  Skin:    General: Skin is warm and dry.  Neurological:     Mental Status: She is alert and oriented to person, place, and time.     Comments: Mildly positive phalans test bilaterally  Psychiatric:        Mood and Affect: Mood normal.        Behavior: Behavior normal.        Thought Content: Thought content normal.        Judgment: Judgment normal.    BP (!) 128/56 (BP Location: Right Arm, Patient Position: Sitting, Cuff Size: Large)    Pulse 78    Temp 98.2 F (36.8 C) (Oral)    Resp 16    Ht 5\' 4"  (1.626 m)    Wt 259 lb (117.5 kg)    SpO2 98%    BMI 44.46 kg/m  Wt Readings from Last 3 Encounters:  01/15/22 259 lb (117.5 kg)  06/01/21 249 lb (112.9 kg)  01/31/21 244 lb 9.6 oz (110.9 kg)       Assessment & Plan:   Problem List Items Addressed This Visit       Unprioritized   Restless leg    Stable, continue  mirapex.        OSA (obstructive sleep apnea)    Notes daytime somnolence. We discussed that this is likely secondary to her untreated OSA. She is anxious to begin cpap once she has coverage.       Insomnia    Overall improved. She continues seroquel. Monitor.       Chronic pain of left knee - Primary    New. Requesting referral to Ortho. We discussed weight loss and knee friendly exercise such as water aerobics and stationary bike.       Relevant Orders   Ambulatory referral to Orthopedics   Bilateral carpal tunnel syndrome    New. Recommended trial of otc wrist splints. If symptoms worsen or fail to improve, plan referral to Orthopedics.        I am having Beth [name] maintain her Glucosamine HCl-MSM (GLUCOSAMINE-MSM PO), Calcium-Magnesium-Zinc, ferrous sulfate, b complex vitamins, Vitamin D, CALCIUM PO, triamcinolone cream, QUEtiapine, pramipexole, celecoxib, and sertraline.  No orders of the defined types were placed in this encounter.

## 2022-01-15 NOTE — Patient Instructions (Signed)
Please get the covid bivalent booster. Purchase wrist splints for your carpal tunnel and wear at night while sleeping and as able during the day.

## 2022-01-15 NOTE — Progress Notes (Signed)
° °  Covid-19 Vaccination Clinic  Name:  Ahuva Poynor    MRN: [id number] DOB: 21-Apr-1956  01/15/2022  Ms. Foronda was observed post Covid-19 immunization for 15 minutes without incident. She was provided with Vaccine Information Sheet and instruction to access the V-Safe system.   Ms. Greeson was instructed to call 911 with any severe reactions post vaccine: Difficulty breathing  Swelling of face and throat  A fast heartbeat  A bad rash all over body  Dizziness and weakness   Immunizations Administered     Name Date Dose VIS Date Route   Pfizer Covid-19 Vaccine Bivalent Booster 01/15/2022 10:46 AM 0.3 mL 08/08/2021 Intramuscular   Manufacturer: [employer], [employer]   Lot: [id number]   NDC: [phone]

## 2022-01-15 NOTE — Telephone Encounter (Signed)
See mychart.  

## 2022-01-15 NOTE — Assessment & Plan Note (Signed)
New. Requesting referral to Ortho. We discussed weight loss and knee friendly exercise such as water aerobics and stationary bike.

## 2022-01-15 NOTE — Assessment & Plan Note (Signed)
Stable, continue mirapex.

## 2022-01-16 NOTE — Telephone Encounter (Signed)
Please request copy of note from [facility].

## 2022-01-17 ENCOUNTER — Other Ambulatory Visit: Payer: Self-pay

## 2022-01-17 ENCOUNTER — Ambulatory Visit (INDEPENDENT_AMBULATORY_CARE_PROVIDER_SITE_OTHER): Payer: No Typology Code available for payment source

## 2022-01-17 ENCOUNTER — Encounter: Payer: Self-pay | Admitting: Orthopaedic Surgery

## 2022-01-17 ENCOUNTER — Ambulatory Visit (INDEPENDENT_AMBULATORY_CARE_PROVIDER_SITE_OTHER): Payer: No Typology Code available for payment source | Admitting: Orthopaedic Surgery

## 2022-01-17 VITALS — Ht 64.0 in | Wt 255.0 lb

## 2022-01-17 DIAGNOSIS — G8929 Other chronic pain: Secondary | ICD-10-CM | POA: Diagnosis not present

## 2022-01-17 DIAGNOSIS — M170 Bilateral primary osteoarthritis of knee: Secondary | ICD-10-CM | POA: Insufficient documentation

## 2022-01-17 DIAGNOSIS — M1712 Unilateral primary osteoarthritis, left knee: Secondary | ICD-10-CM | POA: Diagnosis not present

## 2022-01-17 DIAGNOSIS — M25562 Pain in left knee: Secondary | ICD-10-CM

## 2022-01-17 MED ORDER — METHYLPREDNISOLONE ACETATE 40 MG/ML IJ SUSP
80.0000 mg | INTRAMUSCULAR | Status: AC | PRN
  Administered 2022-01-17: 80 mg via INTRA_ARTICULAR

## 2022-01-17 MED ORDER — LIDOCAINE HCL 1 % IJ SOLN
2.0000 mL | INTRAMUSCULAR | Status: AC | PRN
  Administered 2022-01-17: 2 mL

## 2022-01-17 MED ORDER — BUPIVACAINE HCL 0.25 % IJ SOLN
2.0000 mL | INTRAMUSCULAR | Status: AC | PRN
  Administered 2022-01-17: 2 mL via INTRA_ARTICULAR

## 2022-01-17 NOTE — Progress Notes (Signed)
Office Visit Note   Patient: Beth [name]           Date of Birth: July 26, 1956           MRN: [id number] Visit Date: 01/17/2022              Requested by: Sandford Craze, NP [address] [place],  [place] [address] PCP: Sandford Craze, NP   Assessment & Plan: Visit Diagnoses:  1. Chronic pain of left knee   2. Bilateral primary osteoarthritis of knee    Beth [name] has a chronic history of bilateral knee osteoarthritis.  In 2013 she had a knee replacement while living in [place].  She has had progressive problems with pain in her left knee with x-rays demonstrating advanced osteoarthritis.  She has bone-on-bone in the medial compartment with peripheral osteophytes and subchondral sclerosis.  Long discussion regarding all of the treatment options of which she is aware.  She would like to try her cortisone injection today and even consider viscosupplementation at some point in the future.  She is waiting to have Medicare in July before considering knee replacement. Follow-Up Instructions: Return if symptoms worsen or fail to improve.   Orders:  Orders Placed This Encounter  Procedures   XR KNEE 3 VIEW LEFT   No orders of the defined types were placed in this encounter.     Procedures: Large Joint Inj: L knee on 01/17/2022 1:45 PM Indications: pain and diagnostic evaluation Details: 25 G 1.5 in needle, anteromedial approach  Arthrogram: No  Medications: 2 mL lidocaine 1 %; 80 mg methylPREDNISolone acetate 40 MG/ML; 2 mL bupivacaine 0.25 % Procedure, treatment alternatives, risks and benefits explained, specific risks discussed. Consent was given by the patient. Patient was prepped and draped in the usual sterile fashion.      Clinical Data: No additional findings.   Subjective: Chief Complaint  Patient presents with   Left Knee - Pain  Patient presents today for left knee pain. She said that it has been hurting for a year. No change. The pain  is on and off, but hurts anteriorly when it hurts. She said that it gives out. She does not take anything for pain. No prior left knee surgery.   HPI  Review of Systems   Objective: Vital Signs: Ht 5\' 4"  (1.626 m)    Wt 255 lb (115.7 kg)    BMI 43.77 kg/m   Physical Exam Constitutional:      Appearance: She is well-developed.  Eyes:     Pupils: Pupils are equal, round, and reactive to light.  Pulmonary:     Effort: Pulmonary effort is normal.  Skin:    General: Skin is warm and dry.  Neurological:     Mental Status: She is alert and oriented to person, place, and time.  Psychiatric:        Behavior: Behavior normal.    Ortho Exam left knee was not hot warm or red.  Full extension.  There is considerable medial compartment discomfort with increased varus.  Some patella crepitation but no pain with compression.  Flexed about 100 degrees without instability.  No popliteal pain or mass.  Diffuse mild to moderate medial joint pain.  Straight leg raise negative.  No pain with range of motion of either hip  Specialty Comments:  No specialty comments available.  Imaging: XR KNEE 3 VIEW LEFT  Result Date: 01/17/2022 Films of the left knee were obtained in 3 projections standing.  There is advanced osteoarthritis particularly in the medial compartment where there is bone-on-bone with peripheral osteophytes and subchondral sclerosis.  There is about 2 to 3 degrees of varus.  Significant degenerative changes also noted about the patellofemoral joint and the lateral compartment.    PMFS History: Patient Active Problem List   Diagnosis Date Noted   Bilateral primary osteoarthritis of knee 01/17/2022   Chronic pain of left knee 01/15/2022   Bilateral carpal tunnel syndrome 01/15/2022   Microscopic hematuria 03/04/2021   OSA (obstructive sleep apnea) 07/27/2020   Insomnia 04/25/2020   Restless leg 04/25/2020   Depression 04/25/2020   Arthritis of shoulder 03/28/2020   Primary  osteoarthritis, right shoulder 01/11/2020   Past Medical History:  Diagnosis Date   Anxiety    Arthritis    Depression    Sleep apnea    does not uses CPAP; uses mouth guard.     Family History  Problem Relation Age of Onset   Breast cancer Mother 43   Diabetes Father    Hydrocephalus Father    Tuberculosis Maternal Grandmother    Alcohol abuse Maternal Grandfather        suicide   Diabetes Mellitus II Paternal Grandmother    Peripheral vascular disease Paternal Grandmother    Lupus Maternal Aunt     Past Surgical History:  Procedure Laterality Date   ANKLE SURGERY Right 2008   BACK SURGERY  2009   REPLACEMENT TOTAL KNEE Right 2012   TONSILLECTOMY     TOTAL SHOULDER ARTHROPLASTY Right 03/28/2020   Procedure: RIGHT SHOULDER REPLACEMENT;  Surgeon: Meredith Pel, MD;  Location: [facility];  Service: Orthopedics;  Laterality: Right;   Social History   Occupational History   Occupation: retired  Tobacco Use   Smoking status: Former    Types: Cigarettes    Quit date: 07/22/1989    Years since quitting: 32.5   Smokeless tobacco: Never  Substance and Sexual Activity   Alcohol use: Yes    Comment: social   Drug use: Never   Sexual activity: Yes    Partners: Male

## 2022-01-17 NOTE — Telephone Encounter (Signed)
Records release for faxed 

## 2022-01-18 ENCOUNTER — Other Ambulatory Visit (HOSPITAL_BASED_OUTPATIENT_CLINIC_OR_DEPARTMENT_OTHER): Payer: Self-pay

## 2022-01-18 MED ORDER — PFIZER COVID-19 VAC BIVALENT 30 MCG/0.3ML IM SUSP
INTRAMUSCULAR | 0 refills | Status: DC
  Filled 2022-01-18: qty 0.3, 1d supply, fill #0

## 2022-01-29 ENCOUNTER — Other Ambulatory Visit: Payer: Self-pay | Admitting: Family

## 2022-02-02 ENCOUNTER — Other Ambulatory Visit: Payer: Self-pay | Admitting: Family

## 2022-03-08 ENCOUNTER — Other Ambulatory Visit: Payer: Self-pay | Admitting: Family

## 2022-03-30 ENCOUNTER — Other Ambulatory Visit: Payer: Self-pay | Admitting: Family

## 2022-04-07 ENCOUNTER — Other Ambulatory Visit: Payer: Self-pay | Admitting: Family

## 2022-05-06 ENCOUNTER — Other Ambulatory Visit: Payer: Self-pay | Admitting: Family

## 2022-06-04 ENCOUNTER — Other Ambulatory Visit: Payer: Self-pay | Admitting: Family

## 2022-06-14 ENCOUNTER — Ambulatory Visit (INDEPENDENT_AMBULATORY_CARE_PROVIDER_SITE_OTHER): Payer: Medicare Other | Admitting: Physician Assistant

## 2022-06-14 ENCOUNTER — Encounter: Payer: Self-pay | Admitting: Physician Assistant

## 2022-06-14 DIAGNOSIS — M1712 Unilateral primary osteoarthritis, left knee: Secondary | ICD-10-CM | POA: Diagnosis not present

## 2022-06-14 DIAGNOSIS — M170 Bilateral primary osteoarthritis of knee: Secondary | ICD-10-CM

## 2022-06-14 MED ORDER — LIDOCAINE HCL 1 % IJ SOLN
2.0000 mL | INTRAMUSCULAR | Status: AC | PRN
  Administered 2022-06-14: 2 mL

## 2022-06-14 MED ORDER — BUPIVACAINE HCL 0.25 % IJ SOLN
2.0000 mL | INTRAMUSCULAR | Status: AC | PRN
  Administered 2022-06-14: 2 mL via INTRA_ARTICULAR

## 2022-06-14 MED ORDER — METHYLPREDNISOLONE ACETATE 40 MG/ML IJ SUSP
80.0000 mg | INTRAMUSCULAR | Status: AC | PRN
  Administered 2022-06-14: 80 mg via INTRA_ARTICULAR

## 2022-06-14 NOTE — Progress Notes (Signed)
Office Visit Note   Patient: Beth [name]           Date of Birth: [birth year]           MRN: [id number] Visit Date: 06/14/2022              Requested by: Sandford Craze, NP [address] [place],  [place] [address] PCP: Sandford Craze, NP  Chief Complaint  Patient presents with  . Left Knee - Pain      HPI: Beth [name] is a pleasant 66 year old woman who has a history of left knee arthritis.  She is status post right knee replacement in 2013.  She received her last steroid injection with Dr. Cleophas Dunker in February.  She comes in today requesting another injection.  She has had good results with these  Assessment & Plan: Visit Diagnoses:  1. Bilateral primary osteoarthritis of knee     Plan: We will go forward with injection into the left knee.  We discussed viscosupplementation.  Right now she seems like she gets good relief from steroid but if she would like to get viscosupplementation she will contact us  Follow-Up Instructions: Return if symptoms worsen or fail to improve.   Ortho Exam  Patient is alert, oriented, no adenopathy, well-dressed, normal affect, normal respiratory effort. Left knee no effusion no redness no cellulitis.  Slight varus alignment crepitation with range of motion  Imaging: No results found. No images are attached to the encounter.  Labs: Lab Results  Component Value Date   REPTSTATUS 03/25/2020 FINAL 03/24/2020   CULT (A) 03/24/2020    30,000 COLONIES/mL STREPTOCOCCUS AGALACTIAE TESTING AGAINST S. AGALACTIAE NOT ROUTINELY PERFORMED DUE TO PREDICTABILITY OF AMP/PEN/VAN SUSCEPTIBILITY. Performed at [facility], [address] [place], [place] [address]      Lab Results  Component Value Date   ALBUMIN 4.4 11/27/2020   ALBUMIN 4.3 10/26/2019    No results found for: "MG" No results found for: "VD25OH"  No results found for: "PREALBUMIN"    Latest Ref Rng & Units 03/24/2020   10:54 AM 10/26/2019    11:19 AM  CBC EXTENDED  WBC 4.0 - 10.5 K/uL 8.6  8.1   RBC 3.87 - 5.11 MIL/uL 4.25  4.17   Hemoglobin 12.0 - 15.0 g/dL 08.6  76.1   HCT 95.0 - 46.0 % 40.9  38.8   Platelets 150 - 400 K/uL 311  279.0   NEUT# 1.4 - 7.7 K/uL  5.5   Lymph# 0.7 - 4.0 K/uL  1.9      There is no height or weight on file to calculate BMI.  Orders:  No orders of the defined types were placed in this encounter.  No orders of the defined types were placed in this encounter.    Procedures: Large Joint Inj: L knee on 06/14/2022 11:13 AM Indications: pain and diagnostic evaluation Details: 25 G 1.5 in needle, anteromedial approach  Arthrogram: No  Medications: 80 mg methylPREDNISolone acetate 40 MG/ML; 2 mL lidocaine 1 %; 2 mL bupivacaine 0.25 % Outcome: tolerated well, no immediate complications Procedure, treatment alternatives, risks and benefits explained, specific risks discussed. Consent was given by the patient.     Clinical Data: No additional findings.  ROS:  All other systems negative, except as noted in the HPI. Review of Systems  Objective: Vital Signs: There were no vitals taken for this visit.  Specialty Comments:  No specialty comments available.  PMFS History: Patient Active Problem List  Diagnosis Date Noted  . Bilateral primary osteoarthritis of knee 01/17/2022  . Chronic pain of left knee 01/15/2022  . Bilateral carpal tunnel syndrome 01/15/2022  . Microscopic hematuria 03/04/2021  . OSA (obstructive sleep apnea) 07/27/2020  . Insomnia 04/25/2020  . Restless leg 04/25/2020  . Depression 04/25/2020  . Arthritis of shoulder 03/28/2020  . Primary osteoarthritis, right shoulder 01/11/2020   Past Medical History:  Diagnosis Date  . Anxiety   . Arthritis   . Depression   . Sleep apnea    does not uses CPAP; uses mouth guard.     Family History  Problem Relation Age of Onset  . Breast cancer Mother 61  . Diabetes Father   . Hydrocephalus Father   . Tuberculosis  Maternal Grandmother   . Alcohol abuse Maternal Grandfather        suicide  . Diabetes Mellitus II Paternal Grandmother   . Peripheral vascular disease Paternal Grandmother   . Lupus Maternal Aunt     Past Surgical History:  Procedure Laterality Date  . ANKLE SURGERY Right 2008  . BACK SURGERY  2009  . REPLACEMENT TOTAL KNEE Right 2012  . TONSILLECTOMY    . TOTAL SHOULDER ARTHROPLASTY Right 03/28/2020   Procedure: RIGHT SHOULDER REPLACEMENT;  Surgeon: Cammy Copa, MD;  Location: [facility];  Service: Orthopedics;  Laterality: Right;   Social History   Occupational History  . Occupation: retired  Tobacco Use  . Smoking status: Former    Types: Cigarettes    Quit date: 07/22/1989    Years since quitting: 32.9  . Smokeless tobacco: Never  Substance and Sexual Activity  . Alcohol use: Yes    Comment: social  . Drug use: Never  . Sexual activity: Yes    Partners: Male

## 2022-07-09 ENCOUNTER — Telehealth: Payer: Self-pay | Admitting: Pulmonary Disease

## 2022-07-09 NOTE — Telephone Encounter (Signed)
Called patient and she states she would like an order for a new cpap machine. Last seen in 2021. I advised patient that she would need an appointment for Dr Val Eagle to order new machine. But she stated she just wanted the order for a new cpap.   Please advise sir

## 2022-07-16 ENCOUNTER — Encounter: Payer: Self-pay | Admitting: Gastroenterology

## 2022-07-16 ENCOUNTER — Ambulatory Visit (INDEPENDENT_AMBULATORY_CARE_PROVIDER_SITE_OTHER): Payer: Medicare Other | Admitting: Family

## 2022-07-16 ENCOUNTER — Encounter: Payer: Self-pay | Admitting: Family

## 2022-07-16 ENCOUNTER — Other Ambulatory Visit (HOSPITAL_COMMUNITY)
Admission: RE | Admit: 2022-07-16 | Discharge: 2022-07-16 | Disposition: A | Discharge status: Home / Self Care | Payer: Medicare Other | Source: Ambulatory Visit | Attending: Family | Admitting: Family

## 2022-07-16 VITALS — BP 137/59 | HR 79 | Temp 97.9°F | Resp 16 | Ht 63.5 in | Wt 255.0 lb

## 2022-07-16 DIAGNOSIS — N39498 Other specified urinary incontinence: Secondary | ICD-10-CM

## 2022-07-16 DIAGNOSIS — Z1151 Encounter for screening for human papillomavirus (HPV): Secondary | ICD-10-CM | POA: Insufficient documentation

## 2022-07-16 DIAGNOSIS — F32A Depression, unspecified: Secondary | ICD-10-CM | POA: Diagnosis not present

## 2022-07-16 DIAGNOSIS — Z01419 Encounter for gynecological examination (general) (routine) without abnormal findings: Secondary | ICD-10-CM | POA: Insufficient documentation

## 2022-07-16 DIAGNOSIS — Z0000 Encounter for general adult medical examination without abnormal findings: Secondary | ICD-10-CM | POA: Diagnosis not present

## 2022-07-16 DIAGNOSIS — G2581 Restless legs syndrome: Secondary | ICD-10-CM

## 2022-07-16 DIAGNOSIS — M170 Bilateral primary osteoarthritis of knee: Secondary | ICD-10-CM | POA: Diagnosis not present

## 2022-07-16 DIAGNOSIS — E785 Hyperlipidemia, unspecified: Secondary | ICD-10-CM | POA: Diagnosis not present

## 2022-07-16 DIAGNOSIS — Z23 Encounter for immunization: Secondary | ICD-10-CM | POA: Diagnosis not present

## 2022-07-16 DIAGNOSIS — G4733 Obstructive sleep apnea (adult) (pediatric): Secondary | ICD-10-CM

## 2022-07-16 DIAGNOSIS — Z1211 Encounter for screening for malignant neoplasm of colon: Secondary | ICD-10-CM

## 2022-07-16 DIAGNOSIS — Z780 Asymptomatic menopausal state: Secondary | ICD-10-CM

## 2022-07-16 DIAGNOSIS — R3129 Other microscopic hematuria: Secondary | ICD-10-CM

## 2022-07-16 DIAGNOSIS — L089 Local infection of the skin and subcutaneous tissue, unspecified: Secondary | ICD-10-CM | POA: Insufficient documentation

## 2022-07-16 DIAGNOSIS — Z124 Encounter for screening for malignant neoplasm of cervix: Secondary | ICD-10-CM

## 2022-07-16 DIAGNOSIS — Z1231 Encounter for screening mammogram for malignant neoplasm of breast: Secondary | ICD-10-CM

## 2022-07-16 LAB — LIPID PANEL
Cholesterol: 218 mg/dL — ABNORMAL HIGH (ref 0–200)
HDL: 49.6 mg/dL (ref >39.00)
NonHDL: 168.52
Total CHOL/HDL Ratio: 4
Triglycerides: 266 mg/dL — ABNORMAL HIGH (ref 0.0–149.0)
VLDL: 53.2 mg/dL — ABNORMAL HIGH (ref 0.0–40.0)

## 2022-07-16 LAB — COMPREHENSIVE METABOLIC PANEL
ALT: 21 U/L (ref 0–35)
AST: 17 U/L (ref 0–37)
Albumin: 4.3 g/dL (ref 3.5–5.2)
Alkaline Phosphatase: 110 U/L (ref 39–117)
BUN: 24 mg/dL — ABNORMAL HIGH (ref 6–23)
CO2: 28 mEq/L (ref 19–32)
Calcium: 9.7 mg/dL (ref 8.4–10.5)
Chloride: 105 mEq/L (ref 96–112)
Creatinine, Ser: 1.05 mg/dL (ref 0.40–1.20)
GFR: 55.93 mL/min — ABNORMAL LOW (ref >60.00)
Glucose, Bld: 109 mg/dL — ABNORMAL HIGH (ref 70–99)
Potassium: 4.8 mEq/L (ref 3.5–5.1)
Sodium: 142 mEq/L (ref 135–145)
Total Bilirubin: 0.4 mg/dL (ref 0.2–1.2)
Total Protein: 6.9 g/dL (ref 6.0–8.3)

## 2022-07-16 LAB — LDL CHOLESTEROL, DIRECT: Direct LDL: 113 mg/dL

## 2022-07-16 MED ORDER — CEPHALEXIN 500 MG PO CAPS: 500.0000 mg | ORAL_CAPSULE | Freq: Two times a day (BID) | ORAL | 0 refills | Status: DC

## 2022-07-16 MED ORDER — QUETIAPINE FUMARATE 25 MG PO TABS
25.0000 mg | ORAL_TABLET | Freq: Every day | ORAL | 1 refills | Status: DC
Start: 2022-07-16 — End: 2022-08-02

## 2022-07-16 MED ORDER — SERTRALINE HCL 100 MG PO TABS
100.0000 mg | ORAL_TABLET | Freq: Every day | ORAL | 1 refills | Status: DC
Start: 2022-07-16 — End: 2022-08-02

## 2022-07-16 NOTE — Assessment & Plan Note (Signed)
New. Rx with keflex.

## 2022-07-16 NOTE — Assessment & Plan Note (Addendum)
Pt reports that her symptoms feel stable on seroquel and sertraline.

## 2022-07-16 NOTE — Assessment & Plan Note (Signed)
She saw Urology back in 2022 and repeat UA was negative for RBC.

## 2022-07-16 NOTE — Telephone Encounter (Signed)
She needs seen  May see myself or one of the APP's  Just the rules

## 2022-07-16 NOTE — Assessment & Plan Note (Signed)
Notes bad daytime somnolence. I expect this to improve once she gets her cpap. Refer back to sleep specialist.

## 2022-07-16 NOTE — Telephone Encounter (Signed)
Called the pt and there was no answer- left detailed msg that the needs appt for order for CPAP. Asked to call back if any questions.

## 2022-07-16 NOTE — Assessment & Plan Note (Signed)
Did not find mirapex helpful. She is doing OK without medication.

## 2022-07-16 NOTE — Progress Notes (Addendum)
Subjective:    Beth [name] is a 66 y.o. female who presents for a Welcome to Medicare exam.          Objective:    Today's Vitals   07/16/22 0957  BP: (!) 137/59  Pulse: 79  Resp: 16  Temp: 97.9 F (36.6 C)  TempSrc: Oral  SpO2: 95%  Weight: 255 lb (115.7 kg)  Height: 5' 3.5" (1.613 m)  Body mass index is 44.46 kg/m.  Medications Outpatient Encounter Medications as of 07/16/2022  Medication Sig  . b complex vitamins tablet Take 1 tablet by mouth daily.  Marland Kitchen CALCIUM PO Take 1 tablet by mouth daily.  . celecoxib (CELEBREX) 200 MG capsule TAKE ONE CAPSULE BY MOUTH ONE TIME DAILY  . cephALEXin (KEFLEX) 500 MG capsule Take 1 capsule (500 mg total) by mouth 2 (two) times daily.  . Cholecalciferol (VITAMIN D) 50 MCG (2000 UT) CAPS Take 2,000 Units by mouth daily.  Marland Kitchen COVID-19 mRNA bivalent vaccine, Pfizer, (PFIZER COVID-19 VAC BIVALENT) injection Inject into the muscle.  . ferrous sulfate 325 (65 FE) MG tablet Take 325 mg by mouth daily with breakfast.  . Glucosamine HCl-MSM (GLUCOSAMINE-MSM PO) Take 2 tablets by mouth daily.  . Multiple Minerals (CALCIUM-MAGNESIUM-ZINC) TABS Take 3 tablets by mouth daily.  Marland Kitchen triamcinolone (KENALOG) 0.1 % Apply 1 application topically 2 (two) times daily. To ear canals  . [DISCONTINUED] QUEtiapine (SEROQUEL) 25 MG tablet TAKE ONE TABLET BY MOUTH DAILY AT BEDTIME  . [DISCONTINUED] sertraline (ZOLOFT) 100 MG tablet TAKE ONE TABLET BY MOUTH ONE TIME DAILY  . QUEtiapine (SEROQUEL) 25 MG tablet Take 1 tablet (25 mg total) by mouth at bedtime.  . sertraline (ZOLOFT) 100 MG tablet Take 1 tablet (100 mg total) by mouth daily.  . [DISCONTINUED] pramipexole (MIRAPEX) 0.25 MG tablet TAKE ONE TABLET BY MOUTH DAILY AT BEDTIME   No facility-administered encounter medications on file as of 07/16/2022.     History: Past Medical History:  Diagnosis Date  . Anxiety   . Arthritis   . Depression   . Sleep apnea    does not uses CPAP; uses mouth guard.     Past Surgical History:  Procedure Laterality Date  . ANKLE SURGERY Right 2008  . BACK SURGERY  2009  . REPLACEMENT TOTAL KNEE Right 2012  . TONSILLECTOMY    . TOTAL SHOULDER ARTHROPLASTY Right 03/28/2020   Procedure: RIGHT SHOULDER REPLACEMENT;  Surgeon: Meredith Pel, MD;  Location: [facility];  Service: Orthopedics;  Laterality: Right;    Family History  Problem Relation Age of Onset  . Breast cancer Mother 75  . Diabetes Father   . Hydrocephalus Father   . Tuberculosis Maternal Grandmother   . Alcohol abuse Maternal Grandfather        suicide  . Diabetes Mellitus II Paternal Grandmother   . Peripheral vascular disease Paternal Grandmother   . Lupus Maternal Aunt    Social History   Occupational History  . Occupation: retired  Tobacco Use  . Smoking status: Former    Types: Cigarettes    Quit date: 07/22/1989    Years since quitting: 33.0  . Smokeless tobacco: Never  Substance and Sexual Activity  . Alcohol use: Yes    Comment: social, couple times a month  . Drug use: Never  . Sexual activity: Yes    Partners: Male    Tobacco Counseling Counseling given: Not Answered   Immunizations and Health Maintenance Immunization History  Administered Date(s) Administered  . Influenza,inj,Quad  PF,6+ Mos 09/06/2019  . Influenza-Unspecified 08/23/2020, 08/23/2021  . Moderna Sars-Covid-2 Vaccination 05/05/2020, 06/02/2020, 01/09/2021  . PNEUMOCOCCAL CONJUGATE-20 07/16/2022  . Pension scheme manager 72yr & up 01/15/2022  . Tdap 09/17/2011  . Zoster Recombinat (Shingrix) 09/13/2017, 10/26/2019   Health Maintenance Due  Topic Date Due  . COLONOSCOPY (Pts 45-465yrInsurance coverage will need to be confirmed)  Never done  . TETANUS/TDAP  09/16/2021  . DEXA SCAN  Never done  . INFLUENZA VACCINE  07/09/2022    Activities of Daily Living    07/16/2022    9:57 AM  In your present state of health, do you have any difficulty performing the following  activities:  Hearing? 0  Vision? 0  Difficulty concentrating or making decisions? 1  Walking or climbing stairs? 0  Dressing or bathing? 0  Doing errands, shopping? 0    Physical Exam  (optional), or other factors deemed appropriate based on the beneficiary's medical and social history and current clinical standards. Physical Exam  Constitutional: She is oriented to person, place, and time. She appears well-developed and well-nourished. No distress.  HENT:  Head: Normocephalic and atraumatic.  Right Ear: Tympanic membrane and ear canal normal.  Left Ear: Tympanic membrane and ear canal normal.  Mouth/Throat: Oropharynx is clear and moist.  Eyes: Pupils are equal, round, and reactive to light. No scleral icterus.  Neck: Normal range of motion. No thyromegaly present.  Cardiovascular: Normal rate and regular rhythm.   No murmur heard. Pulmonary/Chest: Effort normal and breath sounds normal. No respiratory distress. He has no wheezes. She has no rales. She exhibits no tenderness.  Abdominal: Soft. Bowel sounds are normal. She exhibits no distension and no mass. There is no tenderness. There is no rebound and no guarding.  Musculoskeletal: She exhibits no edema.  Lymphadenopathy:    She has no cervical adenopathy.  Neurological: She is alert and oriented to person, place, and time. She has normal patellar reflexes. She exhibits normal muscle tone. Coordination normal.  Skin: Skin is warm and dry. Scab noted lower abdomen with some surrounding erythema Psychiatric: She has a normal mood and affect. Her behavior is normal. Judgment and thought content normal.  Breasts: Examined lying Right: Without masses, retractions, discharge or axillary adenopathy.  Left: Without masses, retractions, discharge or axillary adenopathy.  Inguinal/mons: Normal without inguinal adenopathy  External genitalia: Normal  BUS/Urethra/Skene's glands: Normal  Bladder: Normal  Vagina: Normal  Cervix: Normal   Uterus: normal in size, shape and contour. Midline and mobile  Adnexa/parametria:  Rt: Without masses or tenderness.  Lt: Without masses or tenderness.  Anus and perineum: Normal            Assessment & Plan:    Advanced Directives:  No, but interested      Assessment:    This is a routine wellness examination for this patient .   Vision/Hearing screen Vision Screening   Right eye Left eye Both eyes  Without correction     With correction 20/20 20/25 20/20     Dietary issues and exercise activities discussed:   Not exercising due to low energy. Night-time snacking.   Wt Readings from Last 3 Encounters:  07/16/22 255 lb (115.7 kg)  01/17/22 255 lb (115.7 kg)  01/15/22 259 lb (117.5 kg)        Goals   None   Weight loss, increase exercise Depression Screen    07/16/2022   11:27 AM 06/01/2021    1:05 PM 07/26/2020   11:26  AM 10/26/2019   11:29 AM  PHQ 2/9 Scores  PHQ - 2 Score 1 1 0 0  PHQ- 9 Score 11  9 2      Fall Risk    07/16/2022    9:57 AM  Fall Risk   Falls in the past year? 0  Number falls in past yr: 0  Injury with Fall? 0    Cognitive Function: A and O x 3         Patient Care Team: Debbrah Alar, NP as PCP - General (Internal Medicine)     Plan:     I have personally reviewed and noted the following in the patient's chart:   Medical and social history Use of alcohol, tobacco or illicit drugs  Current medications and supplements Functional ability and status Nutritional status Physical activity Advanced directives- gave her a packet to fill and return. List of other physicians Hospitalizations, surgeries, and ER visits in previous 12 months Vitals Screenings to include cognitive, depression, and falls Referrals and appointments  In addition, I have reviewed and discussed with patient certain preventive protocols, quality metrics, and best practice recommendations. A written personalized care plan for preventive  services as well as general preventive health recommendations were provided to patient.     Nance Pear, NP 07/16/2022      Subjective:     Patient ID: Beth [name], female    DOB: 1956-03-12, 66 y.o.   MRN: [id number]  Chief Complaint  Patient presents with  . Welcome to medicare    Here for welcome to medicare exam  . Skin lesion    Patient reports a skin lesion on pubic area, "not healing"    HPI Patient is in today for medicare wellness visit  Health Maintenance Due  Topic Date Due  . COLONOSCOPY (Pts 45-71yr Insurance coverage will need to be confirmed)  Never done  . TETANUS/TDAP  09/16/2021  . DEXA SCAN  Never done  . INFLUENZA VACCINE  07/09/2022    Past Medical History:  Diagnosis Date  . Anxiety   . Arthritis   . Depression   . Sleep apnea    does not uses CPAP; uses mouth guard.     Past Surgical History:  Procedure Laterality Date  . ANKLE SURGERY Right 2008  . BACK SURGERY  2009  . REPLACEMENT TOTAL KNEE Right 2012  . TONSILLECTOMY    . TOTAL SHOULDER ARTHROPLASTY Right 03/28/2020   Procedure: RIGHT SHOULDER REPLACEMENT;  Surgeon: DMeredith Pel MD;  Location: [facility]  Service: Orthopedics;  Laterality: Right;    Family History  Problem Relation Age of Onset  . Breast cancer Mother 580 . Diabetes Father   . Hydrocephalus Father   . Tuberculosis Maternal Grandmother   . Alcohol abuse Maternal Grandfather        suicide  . Diabetes Mellitus II Paternal Grandmother   . Peripheral vascular disease Paternal Grandmother   . Lupus Maternal Aunt     Social History   Socioeconomic History  . Marital status: Married    Spouse name: Ron  . Number of children: 2  . Years of education: Not on file  . Highest education level: Not on file  Occupational History  . Occupation: retired  Tobacco Use  . Smoking status: Former    Types: Cigarettes    Quit date: 07/22/1989    Years since quitting: 33.0  . Smokeless tobacco: Never   Substance and Sexual Activity  . Alcohol use: Yes  Comment: social, couple times a month  . Drug use: Never  . Sexual activity: Yes    Partners: Male  Other Topics Concern  . Not on file  Social History Narrative   Retired Garment/textile technologist   Divorced (from [place] originally)   Remarried 2015 (husband is a Pharmacist, community)   Has a dog   2 children (son and daughter) both married, daughter lives in [place], son in [place]. No grandchildren   Enjoys baking, sewing, reading   Social Determinants of Health   Financial Resource Strain: Low Risk  (07/23/2019)   Overall Financial Resource Strain (CARDIA)   . Difficulty of Paying Living Expenses: Not hard at all  Food Insecurity: No Food Insecurity (07/23/2019)   Hunger Vital Sign   . Worried About Charity fundraiser in the Last Year: Never true   . Ran Out of Food in the Last Year: Never true  Transportation Needs: Unknown (07/23/2019)   [place] - Transportation   . Lack of Transportation (Medical): Not on file   . Lack of Transportation (Non-Medical): No  Physical Activity: Insufficiently Active (07/23/2019)   Exercise Vital Sign   . Days of Exercise per Week: 1 day   . Minutes of Exercise per Session: 30 min  Stress: Not on file  Social Connections: Moderately Integrated (07/23/2019)   Social Connection and Isolation Panel [NHANES]   . Frequency of Communication with Friends and Family: More than three times a week   . Frequency of Social Gatherings with Friends and Family: Never   . Attends Religious Services: More than 4 times per year   . Active Member of Clubs or Organizations: No   . Attends Archivist Meetings: Never   . Marital Status: Married  Human resources officer Violence: Not At Risk (07/23/2019)   Humiliation, Afraid, Rape, and Kick questionnaire   . Fear of Current or Ex-Partner: No   . Emotionally Abused: No   . Physically Abused: No   . Sexually Abused: No    Outpatient Medications Prior to Visit  Medication Sig Dispense  Refill  . b complex vitamins tablet Take 1 tablet by mouth daily.    Marland Kitchen CALCIUM PO Take 1 tablet by mouth daily.    . celecoxib (CELEBREX) 200 MG capsule TAKE ONE CAPSULE BY MOUTH ONE TIME DAILY 30 capsule 0  . Cholecalciferol (VITAMIN D) 50 MCG (2000 UT) CAPS Take 2,000 Units by mouth daily.    Marland Kitchen COVID-19 mRNA bivalent vaccine, Pfizer, (PFIZER COVID-19 VAC BIVALENT) injection Inject into the muscle. 0.3 mL 0  . ferrous sulfate 325 (65 FE) MG tablet Take 325 mg by mouth daily with breakfast.    . Glucosamine HCl-MSM (GLUCOSAMINE-MSM PO) Take 2 tablets by mouth daily.    . Multiple Minerals (CALCIUM-MAGNESIUM-ZINC) TABS Take 3 tablets by mouth daily.    Marland Kitchen triamcinolone (KENALOG) 0.1 % Apply 1 application topically 2 (two) times daily. To ear canals 30 g 0  . QUEtiapine (SEROQUEL) 25 MG tablet TAKE ONE TABLET BY MOUTH DAILY AT BEDTIME 90 tablet 1  . sertraline (ZOLOFT) 100 MG tablet TAKE ONE TABLET BY MOUTH ONE TIME DAILY 90 tablet 1  . pramipexole (MIRAPEX) 0.25 MG tablet TAKE ONE TABLET BY MOUTH DAILY AT BEDTIME 90 tablet 1   No facility-administered medications prior to visit.    No Known Allergies  ROS    See HPI Objective:    Physical Exam  BP (!) 137/59 (BP Location: Right Arm, Patient Position: Sitting, Cuff Size: Large)  Pulse 79   Temp 97.9 F (36.6 C) (Oral)   Resp 16   Ht 5' 3.5" (1.613 m)   Wt 255 lb (115.7 kg)   SpO2 95%   BMI 44.46 kg/m  Wt Readings from Last 3 Encounters:  07/16/22 255 lb (115.7 kg)  01/17/22 255 lb (115.7 kg)  01/15/22 259 lb (117.5 kg)       Assessment & Plan:   Problem List Items Addressed This Visit       Unprioritized   Skin infection    New. Rx with keflex.       Relevant Medications   cephALEXin (KEFLEX) 500 MG capsule   Restless leg    Did not find mirapex helpful. She is doing OK without medication.       OSA (obstructive sleep apnea)    Notes bad daytime somnolence. I expect this to improve once she gets her cpap.  Refer back to sleep specialist.       Relevant Orders   Ambulatory referral to Pulmonology   Microscopic hematuria    She saw Urology back in 2022 and repeat UA was negative for RBC.       Depression    Pt reports that her symptoms feel stable on seroquel and sertraline.       Relevant Medications   sertraline (ZOLOFT) 100 MG tablet   Bilateral primary osteoarthritis of knee    Considering L TKA in the fall.       Other Visit Diagnoses     Welcome to Medicare preventive visit    -  Primary   Colon cancer screening       Relevant Orders   Ambulatory referral to Gastroenterology   Encounter for screening mammogram for malignant neoplasm of breast       Relevant Orders   MM 3D SCREEN BREAST BILATERAL   Hyperlipidemia, unspecified hyperlipidemia type       Relevant Orders   Comp Met (CMET)   Lipid panel   Other urinary incontinence       Relevant Orders   Ambulatory referral to Urogynecology   Encounter for routine gynecological examination with Papanicolaou smear of cervix       Relevant Orders   Cytology - PAP( [place])   Need for pneumococcal 20-valent conjugate vaccination       Relevant Orders   Pneumococcal conjugate vaccine 20-valent (Prevnar 20) (Completed)   Asymptomatic menopausal state       Relevant Orders   DG Bone Density       I have discontinued Kayley Minnifield's pramipexole. I have also changed her QUEtiapine and sertraline. Additionally, I am having her start on cephALEXin. Lastly, I am having her maintain her Glucosamine HCl-MSM (GLUCOSAMINE-MSM PO), Calcium-Magnesium-Zinc, ferrous sulfate, b complex vitamins, Vitamin D, CALCIUM PO, triamcinolone cream, Pfizer COVID-19 Vac Bivalent, and celecoxib.  Meds ordered this encounter  Medications  . cephALEXin (KEFLEX) 500 MG capsule    Sig: Take 1 capsule (500 mg total) by mouth 2 (two) times daily.    Dispense:  14 capsule    Refill:  0    Order Specific Question:   Supervising Provider     Answer:   Penni Homans A [[id number]]  . QUEtiapine (SEROQUEL) 25 MG tablet    Sig: Take 1 tablet (25 mg total) by mouth at bedtime.    Dispense:  90 tablet    Refill:  1  . sertraline (ZOLOFT) 100 MG tablet    Sig: Take 1 tablet (100  mg total) by mouth daily.    Dispense:  90 tablet    Refill:  1

## 2022-07-16 NOTE — Patient Instructions (Signed)
Please call Dr. Trena Platt office to schedule a follow up appointment.

## 2022-07-16 NOTE — Assessment & Plan Note (Signed)
Considering L TKA in the fall.

## 2022-07-17 ENCOUNTER — Telehealth: Payer: Self-pay | Admitting: Family

## 2022-07-17 DIAGNOSIS — E781 Pure hyperglyceridemia: Secondary | ICD-10-CM | POA: Insufficient documentation

## 2022-07-17 DIAGNOSIS — R739 Hyperglycemia, unspecified: Secondary | ICD-10-CM | POA: Insufficient documentation

## 2022-07-17 LAB — CYTOLOGY - PAP
Adequacy: ABSENT
Comment: NEGATIVE
Diagnosis: NEGATIVE
High risk HPV: NEGATIVE

## 2022-07-17 MED ORDER — FISH OIL 1000 MG PO CAPS: 3.0000 | ORAL_CAPSULE | Freq: Two times a day (BID) | ORAL | 0 refills | Status: DC

## 2022-07-17 NOTE — Telephone Encounter (Signed)
Results given to patient tin details with provider recommendations. She verbalized understanding

## 2022-07-17 NOTE — Telephone Encounter (Signed)
Cholesterol is mildly elevated but improved since the last time we checked.   Triglycerides remain elevated.  I would recommend that she add fish oil 3000 iu twice daily and continue to work on reducing white carbs/refined sugars in her diet.   Sugar is mildly elevated but not in the diabetes range.  Above dietary changes will help sugar as well.

## 2022-07-24 ENCOUNTER — Other Ambulatory Visit: Payer: Self-pay | Admitting: Family

## 2022-07-24 ENCOUNTER — Encounter: Payer: Self-pay | Admitting: Family

## 2022-07-25 MED ORDER — CELECOXIB 200 MG PO CAPS: 200.0000 mg | ORAL_CAPSULE | Freq: Every day | ORAL | 0 refills | Status: DC

## 2022-08-01 ENCOUNTER — Ambulatory Visit (AMBULATORY_SURGERY_CENTER): Payer: Medicare Other

## 2022-08-01 VITALS — Ht 63.5 in | Wt 255.0 lb

## 2022-08-01 DIAGNOSIS — Z1211 Encounter for screening for malignant neoplasm of colon: Secondary | ICD-10-CM

## 2022-08-01 MED ORDER — NA SULFATE-K SULFATE-MG SULF 17.5-3.13-1.6 GM/177ML PO SOLN: 1.0000 | ORAL | 0 refills | Status: DC

## 2022-08-01 NOTE — Progress Notes (Signed)
No egg or soy allergy known to patient  No issues known to pt with past sedation with any surgeries or procedures Patient denies ever being told they had issues or difficulty with intubation  No FH of Malignant Hyperthermia Pt is not on diet pills Pt is not on  home 02  Pt is not on blood thinners  Pt denies issues with constipation  No A fib or A flutter Have any cardiac testing pending--denied Pt instructed to use Singlecare.com or GoodRx for a price reduction on prep   

## 2022-08-02 MED ORDER — CELECOXIB 200 MG PO CAPS: 200.0000 mg | ORAL_CAPSULE | Freq: Every day | ORAL | 0 refills | Status: DC

## 2022-08-02 MED ORDER — SERTRALINE HCL 100 MG PO TABS: 100.0000 mg | ORAL_TABLET | Freq: Every day | ORAL | 1 refills | Status: DC

## 2022-08-02 MED ORDER — QUETIAPINE FUMARATE 25 MG PO TABS: 25.0000 mg | ORAL_TABLET | Freq: Every day | ORAL | 1 refills | Status: DC

## 2022-08-06 ENCOUNTER — Ambulatory Visit (HOSPITAL_BASED_OUTPATIENT_CLINIC_OR_DEPARTMENT_OTHER)
Admission: RE | Admit: 2022-08-06 | Discharge: 2022-08-06 | Disposition: A | Discharge status: Home / Self Care | Payer: Medicare Other | Source: Ambulatory Visit | Attending: Family | Admitting: Family

## 2022-08-06 ENCOUNTER — Encounter (HOSPITAL_BASED_OUTPATIENT_CLINIC_OR_DEPARTMENT_OTHER): Payer: Self-pay

## 2022-08-06 DIAGNOSIS — Z780 Asymptomatic menopausal state: Secondary | ICD-10-CM | POA: Diagnosis present

## 2022-08-06 DIAGNOSIS — Z1231 Encounter for screening mammogram for malignant neoplasm of breast: Secondary | ICD-10-CM | POA: Insufficient documentation

## 2022-08-11 ENCOUNTER — Encounter: Payer: Self-pay | Admitting: Certified Registered Nurse Anesthetist

## 2022-08-15 ENCOUNTER — Encounter: Payer: Self-pay | Admitting: Gastroenterology

## 2022-08-15 ENCOUNTER — Ambulatory Visit (AMBULATORY_SURGERY_CENTER): Payer: Medicare Other | Admitting: Gastroenterology

## 2022-08-15 VITALS — BP 154/82 | HR 87 | Temp 98.0°F | Resp 20 | Ht 63.5 in | Wt 255.0 lb

## 2022-08-15 DIAGNOSIS — Z1211 Encounter for screening for malignant neoplasm of colon: Secondary | ICD-10-CM | POA: Diagnosis not present

## 2022-08-15 DIAGNOSIS — K5730 Diverticulosis of large intestine without perforation or abscess without bleeding: Secondary | ICD-10-CM

## 2022-08-15 MED ORDER — SODIUM CHLORIDE 0.9 % IV SOLN: 500.0000 mL | Freq: Once | INTRAVENOUS | Status: DC

## 2022-08-15 NOTE — Patient Instructions (Signed)
YOU HAD AN ENDOSCOPIC PROCEDURE TODAY AT THE [facility]:   Refer to the procedure report that was given to you for any specific questions about what was found during the examination.  If the procedure report does not answer your questions, please call your gastroenterologist to clarify.  If you requested that your care partner not be given the details of your procedure findings, then the procedure report has been included in a sealed envelope for you to review at your convenience later.  YOU SHOULD EXPECT: Some feelings of bloating in the abdomen. Passage of more gas than usual.  Walking can help get rid of the air that was put into your GI tract during the procedure and reduce the bloating. If you had a lower endoscopy (such as a colonoscopy or flexible sigmoidoscopy) you may notice spotting of blood in your stool or on the toilet paper. If you underwent a bowel prep for your procedure, you may not have a normal bowel movement for a few days.  Please Note:  You might notice some irritation and congestion in your nose or some drainage.  This is from the oxygen used during your procedure.  There is no need for concern and it should clear up in a day or so.  SYMPTOMS TO REPORT IMMEDIATELY:  Following lower endoscopy (colonoscopy or flexible sigmoidoscopy):  Excessive amounts of blood in the stool  Significant tenderness or worsening of abdominal pains  Swelling of the abdomen that is new, acute  Fever of 100F or higher  For urgent or emergent issues, a gastroenterologist can be reached at any hour by calling [phone]. Do not use MyChart messaging for urgent concerns.    DIET:  We do recommend a small meal at first, but then you may proceed to your regular diet.  Drink plenty of fluids but you should avoid alcoholic beverages for 24 hours.  ACTIVITY:  You should plan to take it easy for the rest of today and you should NOT DRIVE or use heavy machinery until tomorrow (because of  the sedation medicines used during the test).    FOLLOW UP: Our staff will call the number listed on your records the next business day following your procedure.  We will call around 7:15- 8:00 am to check on you and address any questions or concerns that you may have regarding the information given to you following your procedure. If we do not reach you, we will leave a message.  If you develop any symptoms (ie: fever, flu-like symptoms, shortness of breath, cough etc.) before then, please call [phone].  If you test positive for Covid 19 in the 2 weeks post procedure, please call and report this information to us.     SIGNATURES/CONFIDENTIALITY: You and/or your care partner have signed paperwork which will be entered into your electronic medical record.  These signatures attest to the fact that that the information above on your After Visit Summary has been reviewed and is understood.  Full responsibility of the confidentiality of this discharge information lies with you and/or your care-partner.  

## 2022-08-15 NOTE — Progress Notes (Signed)
Report given to PACU, vss 

## 2022-08-15 NOTE — Progress Notes (Signed)
1100 Robinul 0.2 mg IV given due large amount of secretions upon assessment. Continuously coughing during colonoscopy.  MD made aware, vss

## 2022-08-15 NOTE — Op Note (Signed)
[place] Patient Name: Beth [name] Procedure Date: 08/15/2022 10:55 AM MRN: [id number] Endoscopist: Gerrit Heck , MD Age: 66 Referring MD:  Date of Birth: Apr 29, 1956 Gender: Female Account #: [id number] Procedure:                Colonoscopy Indications:              Screening for colorectal malignant neoplasm (last                            colonoscopy was more than 10 years ago)                           Last colonsocopy was approximately 15 years ago at                            outside facility and normal per patient. Otherwise,                            no recent GI symptoms and no known FHx of colon                            cancer. Medicines:                Monitored Anesthesia Care Procedure:                Pre-Anesthesia Assessment:                           - Prior to the procedure, a History and Physical                            was performed, and patient medications and                            allergies were reviewed. The patient's tolerance of                            previous anesthesia was also reviewed. The risks                            and benefits of the procedure and the sedation                            options and risks were discussed with the patient.                            All questions were answered, and informed consent                            was obtained. Prior Anticoagulants: The patient has                            taken no previous anticoagulant or antiplatelet  agents. ASA Grade Assessment: III - A patient with                            severe systemic disease. After reviewing the risks                            and benefits, the patient was deemed in                            satisfactory condition to undergo the procedure.                           After obtaining informed consent, the colonoscope                            was passed under direct vision. Throughout the                             procedure, the patient's blood pressure, pulse, and                            oxygen saturations were monitored continuously. The                            CF [id number] #[id number] was introduced through the anus                            and advanced to the the cecum, identified by                            appendiceal orifice and ileocecal valve. The                            colonoscopy was performed without difficulty. The                            patient tolerated the procedure well. The quality                            of the bowel preparation was good. The ileocecal                            valve, appendiceal orifice, and rectum were                            photographed. Scope In: 11:00:09 AM Scope Out: 11:16:07 AM Scope Withdrawal Time: 0 hours 10 minutes 2 seconds  Total Procedure Duration: 0 hours 15 minutes 58 seconds  Findings:                 The perianal and digital rectal examinations were                            normal.  Multiple small and large-mouthed diverticula were                            found in the sigmoid colon and ascending colon.                           The exam was otherwise normal throughout the                            remainder of the colon.                           The retroflexed view of the distal rectum and anal                            verge was normal and showed no anal or rectal                            abnormalities. Complications:            No immediate complications. Estimated Blood Loss:     Estimated blood loss: none. Impression:               - Diverticulosis in the sigmoid colon and in the                            ascending colon.                           - The distal rectum and anal verge are normal on                            retroflexion view.                           - No specimens collected. Recommendation:           - Patient has a contact number available for                             emergencies. The signs and symptoms of potential                            delayed complications were discussed with the                            patient. Return to normal activities tomorrow.                            Written discharge instructions were provided to the                            patient.                           - Resume previous diet.                           -  Continue present medications.                           - Repeat colonoscopy in 10 years for screening                            purposes.                           - Return to GI office PRN. Gerrit Heck, MD 08/15/2022 11:20:34 AM

## 2022-08-15 NOTE — Progress Notes (Signed)
GASTROENTEROLOGY PROCEDURE H&P NOTE   Primary Care Physician: Sandford Craze, NP    Reason for Procedure:  Colon Cancer screening  Plan:    Colonoscopy  Patient is appropriate for endoscopic procedure(s) in the ambulatory ([facility]) setting.  The nature of the procedure, as well as the risks, benefits, and alternatives were carefully and thoroughly reviewed with the patient. Ample time for discussion and questions allowed. The patient understood, was satisfied, and agreed to proceed.     HPI: Beth [name] is a 66 y.o. female who presents for colonoscopy for routine Colon Cancer screening.  No active GI symptoms.  No known family history of colon cancer or related malignancy.  Patient is otherwise without complaints or active issues today.  Past Medical History:  Diagnosis Date   Anxiety    Arthritis    Depression    Sleep apnea    does not uses CPAP; uses mouth guard.     Past Surgical History:  Procedure Laterality Date   ANKLE SURGERY Right 2008   BACK SURGERY  2009   COLONOSCOPY     REPLACEMENT TOTAL KNEE Right 2012   TONSILLECTOMY     TOTAL SHOULDER ARTHROPLASTY Right 03/28/2020   Procedure: RIGHT SHOULDER REPLACEMENT;  Surgeon: Cammy Copa, MD;  Location: [facility];  Service: Orthopedics;  Laterality: Right;    Prior to Admission medications   Medication Sig Start Date End Date Taking? Authorizing Provider  b complex vitamins tablet Take 1 tablet by mouth daily.   Yes [provider]  CALCIUM PO Take 1 tablet by mouth daily.   Yes [provider]  celecoxib (CELEBREX) 200 MG capsule Take 1 capsule (200 mg total) by mouth daily. 08/02/22  Yes Sandford Craze, NP  Cholecalciferol (VITAMIN D) 50 MCG (2000 UT) CAPS Take 2,000 Units by mouth daily.   Yes [provider]  ferrous sulfate 325 (65 FE) MG tablet Take 325 mg by mouth daily with breakfast.   Yes [provider]  Glucosamine HCl-MSM (GLUCOSAMINE-MSM PO) Take 2  tablets by mouth daily.   Yes [provider]  Multiple Minerals (CALCIUM-MAGNESIUM-ZINC) TABS Take 3 tablets by mouth daily.   Yes [provider]  Omega-3 Fatty Acids (FISH OIL) 1000 MG CAPS Take 3 capsules (3,000 mg total) by mouth in the morning and at bedtime. 07/17/22  Yes Sandford Craze, NP  QUEtiapine (SEROQUEL) 25 MG tablet Take 1 tablet (25 mg total) by mouth at bedtime. 08/02/22  Yes Sandford Craze, NP  sertraline (ZOLOFT) 100 MG tablet Take 1 tablet (100 mg total) by mouth daily. 08/02/22  Yes Sandford Craze, NP  triamcinolone (KENALOG) 0.1 % Apply 1 application topically 2 (two) times daily. To ear canals 10/27/20  Yes Sandford Craze, NP  cephALEXin (KEFLEX) 500 MG capsule Take 1 capsule (500 mg total) by mouth 2 (two) times daily. Patient not taking: Reported on 08/01/2022 07/16/22   Sandford Craze, NP    Current Outpatient Medications  Medication Sig Dispense Refill   b complex vitamins tablet Take 1 tablet by mouth daily.     CALCIUM PO Take 1 tablet by mouth daily.     celecoxib (CELEBREX) 200 MG capsule Take 1 capsule (200 mg total) by mouth daily. 30 capsule 0   Cholecalciferol (VITAMIN D) 50 MCG (2000 UT) CAPS Take 2,000 Units by mouth daily.     ferrous sulfate 325 (65 FE) MG tablet Take 325 mg by mouth daily with breakfast.     Glucosamine HCl-MSM (GLUCOSAMINE-MSM PO)  Take 2 tablets by mouth daily.     Multiple Minerals (CALCIUM-MAGNESIUM-ZINC) TABS Take 3 tablets by mouth daily.     Omega-3 Fatty Acids (FISH OIL) 1000 MG CAPS Take 3 capsules (3,000 mg total) by mouth in the morning and at bedtime.  0   QUEtiapine (SEROQUEL) 25 MG tablet Take 1 tablet (25 mg total) by mouth at bedtime. 90 tablet 1   sertraline (ZOLOFT) 100 MG tablet Take 1 tablet (100 mg total) by mouth daily. 90 tablet 1   triamcinolone (KENALOG) 0.1 % Apply 1 application topically 2 (two) times daily. To ear canals 30 g 0   cephALEXin (KEFLEX) 500 MG capsule Take 1  capsule (500 mg total) by mouth 2 (two) times daily. (Patient not taking: Reported on 08/01/2022) 14 capsule 0   Current Facility-Administered Medications  Medication Dose Route Frequency Provider Last Rate Last Admin   0.9 %  sodium chloride infusion  500 mL Intravenous Once Ulus Hazen V, DO        Allergies as of 08/15/2022   (No Known Allergies)    Family History  Problem Relation Age of Onset   Breast cancer Mother 39   Diabetes Father    Hydrocephalus Father    Lupus Maternal Aunt    Tuberculosis Maternal Grandmother    Alcohol abuse Maternal Grandfather        suicide   Diabetes Mellitus II Paternal Grandmother    Peripheral vascular disease Paternal Grandmother    Colon cancer Neg Hx    Colon polyps Neg Hx    Esophageal cancer Neg Hx    Stomach cancer Neg Hx    Rectal cancer Neg Hx     Social History   Socioeconomic History   Marital status: Married    Spouse name: Ron   Number of children: 2   Years of education: Not on file   Highest education level: Not on file  Occupational History   Occupation: retired  Tobacco Use   Smoking status: Former    Types: Cigarettes    Quit date: 07/22/1989    Years since quitting: 33.0   Smokeless tobacco: Never  Vaping Use   Vaping Use: Never used  Substance and Sexual Activity   Alcohol use: Yes    Comment: social, couple times a month   Drug use: Never   Sexual activity: Yes    Partners: Male  Other Topics Concern   Not on file  Social History Narrative   Retired Music therapist   Divorced (from [place] originally)   Remarried 2015 (husband is a IT trainer)   Has a dog   2 children (son and daughter) both married, daughter lives in [place], son in [place]. No grandchildren   Enjoys baking, sewing, reading   Social Determinants of Health   Financial Resource Strain: Low Risk  (07/23/2019)   Overall Financial Resource Strain (CARDIA)    Difficulty of Paying Living Expenses: Not hard at all  Food Insecurity: No Food  Insecurity (07/23/2019)   Hunger Vital Sign    Worried About Running Out of Food in the Last Year: Never true    Ran Out of Food in the Last Year: Never true  Transportation Needs: Unknown (07/23/2019)   PRAPARE - Administrator, Civil Service (Medical): Not on file    Lack of Transportation (Non-Medical): No  Physical Activity: Insufficiently Active (07/23/2019)   Exercise Vital Sign    Days of Exercise per Week: 1 day    Minutes  of Exercise per Session: 30 min  Stress: Not on file  Social Connections: Moderately Integrated (07/23/2019)   Social Connection and Isolation Panel [NHANES]    Frequency of Communication with Friends and Family: More than three times a week    Frequency of Social Gatherings with Friends and Family: Never    Attends Religious Services: More than 4 times per year    Active Member of [employer] or Organizations: No    Attends Banker Meetings: Never    Marital Status: Married  Catering manager Violence: Not At Risk (07/23/2019)   Humiliation, Afraid, Rape, and Kick questionnaire    Fear of Current or Ex-Partner: No    Emotionally Abused: No    Physically Abused: No    Sexually Abused: No    Physical Exam: Vital signs in last 24 hours: @BP  138/70   Pulse 78   Temp 98 F (36.7 C) (Temporal)   Ht 5' 3.5" (1.613 m)   Wt 255 lb (115.7 kg)   SpO2 95%   BMI 44.46 kg/m  GEN: NAD EYE: Sclerae anicteric ENT: MMM CV: Non-tachycardic Pulm: CTA b/l GI: Soft, NT/ND NEURO:  Alert & Oriented x 3   , DO [facility]   08/15/2022 10:52 AM

## 2022-08-15 NOTE — Progress Notes (Signed)
VS completed by DT.  Pt's states no medical or surgical changes since previsit or office visit.  

## 2022-08-16 ENCOUNTER — Telehealth: Payer: Self-pay | Admitting: *Deleted

## 2022-08-16 NOTE — Telephone Encounter (Signed)
Attempted to call patient for their post-procedure follow-up call. No answer. Left voicemail.   

## 2022-08-28 ENCOUNTER — Encounter: Payer: Self-pay | Admitting: Pulmonary Disease

## 2022-08-28 ENCOUNTER — Ambulatory Visit (INDEPENDENT_AMBULATORY_CARE_PROVIDER_SITE_OTHER): Payer: Medicare Other | Admitting: Pulmonary Disease

## 2022-08-28 VITALS — BP 114/72 | HR 75 | Temp 97.8°F | Ht 63.0 in | Wt 264.0 lb

## 2022-08-28 DIAGNOSIS — G4733 Obstructive sleep apnea (adult) (pediatric): Secondary | ICD-10-CM

## 2022-08-28 MED ORDER — ZOLPIDEM TARTRATE 10 MG PO TABS: 10.0000 mg | ORAL_TABLET | Freq: Every evening | ORAL | 2 refills | Status: DC | PRN

## 2022-08-28 NOTE — Patient Instructions (Addendum)
History of moderate obstructive  Oral device not working as well  We will send in the prescription for auto CPAP 5-20 with heated humidification  You may require a sleep study-insurance requirement   I will send you a prescription for a sleep aid If not helpful, give [place] a call and we can try something else that may work  Weight loss efforts as tolerated  Tentative follow-up in 3 to 4 months

## 2022-08-28 NOTE — Progress Notes (Signed)
Beth [name]    [id number]    1956-03-04  Primary Care Physician:O'Sullivan, Lenna Sciara, NP  Referring Physician: Debbrah Alar, NP [address] [place],  [place] [address]  Chief complaint:   Patient being seen for obstructive sleep apnea  HPI:  Having difficulty with insomnia and sleep apnea  Continues to use an oral device but does not think it is working well  Still having periods of waking up with gasping Sometimes has difficulty initiating and maintaining sleep  She wants to try CPAP  She used CPAP over 20 years ago Did have issues with the mask  Still snores with nonrestorative sleep Most recent study was in January 2022 showing moderate obstructive sleep apnea Has had to 2 prior sleep studies in the past both over 64 years old She has been using an oral device since then  Has gained about 50 pounds Multiple knee surgeries, shoulder surgery Needs a repeat knee surgery  Long-term history of difficulty falling asleep Has tried multiple sleep aids in the past that have not helped  She is on an antidepressant  Admits to falling asleep around 11 2 1, it usually takes hours to go to sleep 1-2 awakenings Final wake up time between 7 and 9 AM  Weight is up about 50 pounds  Has pets in the house but they do not bother her sleep  Outpatient Encounter Medications as of 08/28/2022  Medication Sig   b complex vitamins tablet Take 1 tablet by mouth daily.   CALCIUM PO Take 1 tablet by mouth daily.   celecoxib (CELEBREX) 200 MG capsule Take 1 capsule (200 mg total) by mouth daily.   Cholecalciferol (VITAMIN D) 50 MCG (2000 UT) CAPS Take 2,000 Units by mouth daily.   ferrous sulfate 325 (65 FE) MG tablet Take 325 mg by mouth daily with breakfast.   Glucosamine HCl-MSM (GLUCOSAMINE-MSM PO) Take 2 tablets by mouth daily.   Multiple Minerals (CALCIUM-MAGNESIUM-ZINC) TABS Take 3 tablets by mouth daily.   Omega-3 Fatty Acids (FISH OIL) 1000 MG  CAPS Take 3 capsules (3,000 mg total) by mouth in the morning and at bedtime.   QUEtiapine (SEROQUEL) 25 MG tablet Take 1 tablet (25 mg total) by mouth at bedtime.   sertraline (ZOLOFT) 100 MG tablet Take 1 tablet (100 mg total) by mouth daily.   triamcinolone (KENALOG) 0.1 % Apply 1 application topically 2 (two) times daily. To ear canals   [DISCONTINUED] cephALEXin (KEFLEX) 500 MG capsule Take 1 capsule (500 mg total) by mouth 2 (two) times daily. (Patient not taking: Reported on 08/01/2022)   No facility-administered encounter medications on file as of 08/28/2022.    Allergies as of 08/28/2022   (No Known Allergies)    Past Medical History:  Diagnosis Date   Anxiety    Arthritis    Depression    Sleep apnea    does not uses CPAP; uses mouth guard.     Past Surgical History:  Procedure Laterality Date   ANKLE SURGERY Right 2008   BACK SURGERY  2009   COLONOSCOPY     REPLACEMENT TOTAL KNEE Right 2012   TONSILLECTOMY     TOTAL SHOULDER ARTHROPLASTY Right 03/28/2020   Procedure: RIGHT SHOULDER REPLACEMENT;  Surgeon: Meredith Pel, MD;  Location: [facility];  Service: Orthopedics;  Laterality: Right;    Family History  Problem Relation Age of Onset   Breast cancer Mother 16   Diabetes Father  Hydrocephalus Father    Lupus Maternal Aunt    Tuberculosis Maternal Grandmother    Alcohol abuse Maternal Grandfather        suicide   Diabetes Mellitus II Paternal Grandmother    Peripheral vascular disease Paternal Grandmother    Colon cancer Neg Hx    Colon polyps Neg Hx    Esophageal cancer Neg Hx    Stomach cancer Neg Hx    Rectal cancer Neg Hx     Social History   Socioeconomic History   Marital status: Married    Spouse name: Ron   Number of children: 2   Years of education: Not on file   Highest education level: Not on file  Occupational History   Occupation: retired  Tobacco Use   Smoking status: Former    Types: Cigarettes    Quit date: 07/22/1989     Years since quitting: 33.1   Smokeless tobacco: Never  Vaping Use   Vaping Use: Never used  Substance and Sexual Activity   Alcohol use: Yes    Comment: social, couple times a month   Drug use: Never   Sexual activity: Yes    Partners: Male  Other Topics Concern   Not on file  Social History Narrative   Retired Music therapist   Divorced (from [place] originally)   Remarried 2015 (husband is a IT trainer)   Has a dog   2 children (son and daughter) both married, daughter lives in [place], son in [place]. No grandchildren   Enjoys baking, sewing, reading   Social Determinants of Health   Financial Resource Strain: Low Risk  (07/23/2019)   Overall Financial Resource Strain (CARDIA)    Difficulty of Paying Living Expenses: Not hard at all  Food Insecurity: No Food Insecurity (07/23/2019)   Hunger Vital Sign    Worried About Running Out of Food in the Last Year: Never true    Ran Out of Food in the Last Year: Never true  Transportation Needs: Unknown (07/23/2019)   PRAPARE - Administrator, Civil Service (Medical): Not on file    Lack of Transportation (Non-Medical): No  Physical Activity: Insufficiently Active (07/23/2019)   Exercise Vital Sign    Days of Exercise per Week: 1 day    Minutes of Exercise per Session: 30 min  Stress: Not on file  Social Connections: Moderately Integrated (07/23/2019)   Social Connection and Isolation Panel [NHANES]    Frequency of Communication with Friends and Family: More than three times a week    Frequency of Social Gatherings with Friends and Family: Never    Attends Religious Services: More than 4 times per year    Active Member of [employer] or Organizations: No    Attends Banker Meetings: Never    Marital Status: Married  Catering manager Violence: Not At Risk (07/23/2019)   Humiliation, Afraid, Rape, and Kick questionnaire    Fear of Current or Ex-Partner: No    Emotionally Abused: No    Physically Abused: No    Sexually Abused:  No    Review of Systems  Constitutional:  Positive for fatigue.  Respiratory:  Positive for apnea.   Psychiatric/Behavioral:  Positive for sleep disturbance.     Vitals:   08/28/22 1449  BP: 114/72  Pulse: 75  Temp: 97.8 F (36.6 C)  SpO2: 98%     Physical Exam Constitutional:      Appearance: She is obese.  HENT:     Head: Normocephalic.  Nose: No congestion.     Mouth/Throat:     Mouth: Mucous membranes are moist.     Comments: Mallampati 4, crowded oropharynx, macroglossia Eyes:     General:        Right eye: No discharge.        Left eye: No discharge.  Cardiovascular:     Rate and Rhythm: Regular rhythm. Tachycardia present.     Pulses: Normal pulses.     Heart sounds: No murmur heard.    No friction rub.  Pulmonary:     Effort: Pulmonary effort is normal. No respiratory distress.     Breath sounds: Normal breath sounds. No stridor. No wheezing or rhonchi.  Musculoskeletal:     Cervical back: No rigidity or tenderness.  Neurological:     Mental Status: She is alert.  Psychiatric:        Mood and Affect: Mood normal.    Data Reviewed: Previous sleep studies not available to be reviewed  Most recent sleep study reviewed showing moderate obstructive sleep apnea  Assessment:  Obstructive sleep apnea -Does not seem to be helping her sleep -Having daytime sleepiness and difficulty maintaining sleep  Insomnia -Multiple agents have not helped in the past -Willing to try sleep aids -Discussed Ambien and options of other agents if Ambien not helpful  Obesity  Pathophysiology of sleep disordered breathing discussed Treatment options discussed  Plan/Recommendations:  We will send in prescription for CPAP auto CPAP 5-20  May require repeat sleep study  Encourage weight loss efforts  Risk of not treating sleep disordered breathing discussed  Tentative follow-up in 3 to 4 months  Encouraged to call with significant concerns  Encouraged to  call if Ambien not helping    Virl Diamond MD [facility] 08/28/2022, 3:08 PM  CC: Sandford Craze, NP

## 2022-09-13 ENCOUNTER — Other Ambulatory Visit: Payer: Self-pay | Admitting: Family

## 2022-09-13 MED ORDER — CELECOXIB 200 MG PO CAPS: 200.0000 mg | ORAL_CAPSULE | Freq: Every day | ORAL | 1 refills | Status: DC

## 2022-09-18 ENCOUNTER — Encounter: Payer: Self-pay | Admitting: Orthopaedic Surgery

## 2022-09-18 ENCOUNTER — Ambulatory Visit (INDEPENDENT_AMBULATORY_CARE_PROVIDER_SITE_OTHER): Payer: Medicare Other | Admitting: Orthopaedic Surgery

## 2022-09-18 DIAGNOSIS — M25562 Pain in left knee: Secondary | ICD-10-CM | POA: Diagnosis not present

## 2022-09-18 DIAGNOSIS — G8929 Other chronic pain: Secondary | ICD-10-CM

## 2022-09-18 MED ORDER — LIDOCAINE HCL 1 % IJ SOLN
2.0000 mL | INTRAMUSCULAR | Status: AC | PRN
  Administered 2022-09-18: 2 mL

## 2022-09-18 MED ORDER — METHYLPREDNISOLONE ACETATE 40 MG/ML IJ SUSP
80.0000 mg | INTRAMUSCULAR | Status: AC | PRN
  Administered 2022-09-18: 80 mg via INTRA_ARTICULAR

## 2022-09-18 MED ORDER — BUPIVACAINE HCL 0.25 % IJ SOLN
2.0000 mL | INTRAMUSCULAR | Status: AC | PRN
  Administered 2022-09-18: 2 mL via INTRA_ARTICULAR

## 2022-09-18 NOTE — Progress Notes (Signed)
Office Visit Note   Patient: Beth [name]           Date of Birth: 1956/07/15           MRN: [id number] Visit Date: 09/18/2022              Requested by: Debbrah Alar, NP [address] [place],  [place] [address] PCP: Debbrah Alar, NP   Assessment & Plan: Visit Diagnoses:  1. Chronic pain of left knee     Plan: Beth [name] is a pleasant 66 year old woman who comes in today requesting a steroid injection into her left knee.  Her last injection was approximately 3 months ago.  She said it did help her quite a bit until she recently went to the gym.  She denies any injuries.  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  She also wanted to discuss knee replacement today.  Her current BMI is 46.  We calculated that she would have to lose about 40 to 45 pounds to have an appropriate BMI to go safely forward with left knee replacement.  Risks surgical outcomes were discussed with her.  We recommend following up with Dr. Ninfa Linden.  She is going to work on this.  We discussed nonweightbearing exercise.  We will go forward with an injection of cortisone today  Follow-Up Instructions: Return if symptoms worsen or fail to improve.   Orders:  Orders Placed This Encounter  Procedures   Large Joint Inj: L knee   No orders of the defined types were placed in this encounter.     Procedures: Large Joint Inj: L knee on 09/18/2022 10:29 AM Indications: pain and diagnostic evaluation Details: 25 G 1.5 in needle, anteromedial approach  Arthrogram: No  Medications: 80 mg methylPREDNISolone acetate 40 MG/ML; 2 mL lidocaine 1 %; 2 mL bupivacaine 0.25 % Outcome: tolerated well, no immediate complications Procedure, treatment alternatives, risks and benefits explained, specific risks discussed. Consent was given by the patient.      Clinical Data: No additional findings.   Subjective: Chief Complaint  Patient presents with   Left  Knee - Pain, Follow-up    HPI 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  She has been getting cortisone injections every few months.  She was doing well but she thinks she irritated the left knee when she went back to the gym.  Requesting a cortisone injection today would also like to discuss knee replacement.  Review of Systems  All other systems reviewed and are negative.    Objective: Vital Signs: There were no vitals taken for this visit.  Physical Exam Constitutional:      Appearance: Normal appearance.  Pulmonary:     Effort: Pulmonary effort is normal.  Neurological:     General: No focal deficit present.     Mental Status: She is alert.     Ortho Exam Examination of the left knee no effusion no heat no swelling.  Compartments of the lower leg are soft and nontender.  She has crepitus with range of motion good varus valgus stability she is neurovascularly intact.  Mild medial joint pain.  Full extension of flex to probably 100 degrees without instability Specialty Comments:  No specialty comments available.  Imaging: No results found.   PMFS History: Patient Active Problem List  Diagnosis Date Noted   Hypertriglyceridemia 07/17/2022   Hyperglycemia 07/17/2022   Skin infection 07/16/2022   Bilateral primary osteoarthritis of knee 01/17/2022   Chronic pain of left knee 01/15/2022   Bilateral carpal tunnel syndrome 01/15/2022   Microscopic hematuria 03/04/2021   OSA (obstructive sleep apnea) 07/27/2020   Insomnia 04/25/2020   Restless leg 04/25/2020   Depression 04/25/2020   Arthritis of shoulder 03/28/2020   Primary osteoarthritis, right shoulder 01/11/2020   Past Medical History:  Diagnosis Date   Anxiety    Arthritis    Depression    Sleep apnea    does not uses CPAP; uses mouth guard.     Family History  Problem Relation  Age of Onset   Breast cancer Mother 33   Diabetes Father    Hydrocephalus Father    Lupus Maternal Aunt    Tuberculosis Maternal Grandmother    Alcohol abuse Maternal Grandfather        suicide   Diabetes Mellitus II Paternal Grandmother    Peripheral vascular disease Paternal Grandmother    Colon cancer Neg Hx    Colon polyps Neg Hx    Esophageal cancer Neg Hx    Stomach cancer Neg Hx    Rectal cancer Neg Hx     Past Surgical History:  Procedure Laterality Date   ANKLE SURGERY Right 2008   BACK SURGERY  2009   COLONOSCOPY     REPLACEMENT TOTAL KNEE Right 2012   TONSILLECTOMY     TOTAL SHOULDER ARTHROPLASTY Right 03/28/2020   Procedure: RIGHT SHOULDER REPLACEMENT;  Surgeon: Cammy Copa, MD;  Location[facility];  Service: Orthopedics;  Laterality: Right;   Social History   Occupational History   Occupation: retired  Tobacco Use   Smoking status: Former    Types: Cigarettes    Quit date: 07/22/1989    Years since quitting: 33.1   Smokeless tobacco: Never  Vaping Use   Vaping Use: Never used  Substance and Sexual Activity   Alcohol use: Yes    Comment: social, couple times a month   Drug use: Never   Sexual activity: Yes    Partners: Male

## 2022-11-14 ENCOUNTER — Encounter: Payer: Self-pay | Admitting: Pulmonary Disease

## 2022-11-14 ENCOUNTER — Ambulatory Visit (INDEPENDENT_AMBULATORY_CARE_PROVIDER_SITE_OTHER): Payer: Medicare Other | Admitting: Pulmonary Disease

## 2022-11-14 VITALS — BP 128/76 | HR 59 | Temp 97.7°F | Ht 63.0 in | Wt 240.0 lb

## 2022-11-14 DIAGNOSIS — G4733 Obstructive sleep apnea (adult) (pediatric): Secondary | ICD-10-CM | POA: Diagnosis not present

## 2022-11-14 MED ORDER — ZOLPIDEM TARTRATE ER 12.5 MG PO TBCR: 12.5000 mg | EXTENDED_RELEASE_TABLET | Freq: Every evening | ORAL | 2 refills | Status: DC | PRN

## 2022-11-14 NOTE — Patient Instructions (Signed)
Download from your machine shows your CPAP is working well  Continue Ambien  Prescription for 12.5 Ambien sent to pharmacy for you  You can always reach out to update the medications if there are any changes  Call us with significant concerns

## 2022-11-14 NOTE — Progress Notes (Signed)
Beth [name]    [id number]    [birth year]  Primary Care Physician:O'Sullivan, Efraim Kaufmann, NP  Referring Physician: Sandford Craze, NP [address] [place],  [place] [address]  Chief complaint:   Patient being seen for obstructive sleep apnea  HPI:  Feeling overall better  Using CPAP nightly  Using Ambien for sleep and it seems to be helping  Still wakes up around 4 AM but sleep quality is better  She used CPAP over 20 years ago Did have issues with the mask  Still snores with nonrestorative sleep Most recent study was in January 2022 showing moderate obstructive sleep apnea Has had to 2 prior sleep studies in the past both over 28 years old She has been using an oral device since then  Has gained about 50 pounds Multiple knee surgeries, shoulder surgery Needs a repeat knee surgery  Long-term history of difficulty falling asleep Has tried multiple sleep aids in the past that have not helped  She is on an antidepressant  Admits to falling asleep around 11 2 1, it usually takes hours to go to sleep 1-2 awakenings Final wake up time between 7 and 9 AM  Weight is stable since last visit  Has pets in the house but they do not bother her sleep  Outpatient Encounter Medications as of 11/14/2022  Medication Sig   b complex vitamins tablet Take 1 tablet by mouth daily.   CALCIUM PO Take 1 tablet by mouth daily.   celecoxib (CELEBREX) 200 MG capsule Take 1 capsule (200 mg total) by mouth daily.   Cholecalciferol (VITAMIN D) 50 MCG (2000 UT) CAPS Take 2,000 Units by mouth daily.   ferrous sulfate 325 (65 FE) MG tablet Take 325 mg by mouth daily with breakfast.   Glucosamine HCl-MSM (GLUCOSAMINE-MSM PO) Take 2 tablets by mouth daily.   Multiple Minerals (CALCIUM-MAGNESIUM-ZINC) TABS Take 3 tablets by mouth daily.   Omega-3 Fatty Acids (FISH OIL) 1000 MG CAPS Take 3 capsules (3,000 mg total) by mouth in the morning and at bedtime.   QUEtiapine  (SEROQUEL) 25 MG tablet Take 1 tablet (25 mg total) by mouth at bedtime.   sertraline (ZOLOFT) 100 MG tablet Take 1 tablet (100 mg total) by mouth daily.   triamcinolone (KENALOG) 0.1 % Apply 1 application topically 2 (two) times daily. To ear canals   zolpidem (AMBIEN) 10 MG tablet Take 1 tablet (10 mg total) by mouth at bedtime as needed for sleep.   No facility-administered encounter medications on file as of 11/14/2022.    Allergies as of 11/14/2022   (No Known Allergies)    Past Medical History:  Diagnosis Date   Anxiety    Arthritis    Depression    Sleep apnea    does not uses CPAP; uses mouth guard.     Past Surgical History:  Procedure Laterality Date   ANKLE SURGERY Right 2008   BACK SURGERY  2009   COLONOSCOPY     REPLACEMENT TOTAL KNEE Right 2012   TONSILLECTOMY     TOTAL SHOULDER ARTHROPLASTY Right 03/28/2020   Procedure: RIGHT SHOULDER REPLACEMENT;  Surgeon: Cammy Copa, MD;  Location[facility];  Service: Orthopedics;  Laterality: Right;    Family History  Problem Relation Age of Onset   Breast cancer Mother 32   Diabetes Father    Hydrocephalus Father    Lupus Maternal Aunt    Tuberculosis Maternal Grandmother    Alcohol  abuse Maternal Grandfather        suicide   Diabetes Mellitus II Paternal Grandmother    Peripheral vascular disease Paternal Grandmother    Colon cancer Neg Hx    Colon polyps Neg Hx    Esophageal cancer Neg Hx    Stomach cancer Neg Hx    Rectal cancer Neg Hx     Social History   Socioeconomic History   Marital status: Married    Spouse name: Ron   Number of children: 2   Years of education: Not on file   Highest education level: Not on file  Occupational History   Occupation: retired  Tobacco Use   Smoking status: Former    Types: Cigarettes    Quit date: 07/22/1989    Years since quitting: 33.3   Smokeless tobacco: Never  Vaping Use   Vaping Use: Never used  Substance and Sexual Activity   Alcohol use: Yes     Comment: social, couple times a month   Drug use: Never   Sexual activity: Yes    Partners: Male  Other Topics Concern   Not on file  Social History Narrative   Retired Music therapist   Divorced (from [place] originally)   Remarried 2015 (husband is a IT trainer)   Has a dog   2 children (son and daughter) both married, daughter lives in [place], son in [place]. No grandchildren   Enjoys baking, sewing, reading   Social Determinants of Health   Financial Resource Strain: Low Risk  (07/23/2019)   Overall Financial Resource Strain (CARDIA)    Difficulty of Paying Living Expenses: Not hard at all  Food Insecurity: No Food Insecurity (07/23/2019)   Hunger Vital Sign    Worried About Running Out of Food in the Last Year: Never true    Ran Out of Food in the Last Year: Never true  Transportation Needs: Unknown (07/23/2019)   PRAPARE - Administrator, Civil Service (Medical): Not on file    Lack of Transportation (Non-Medical): No  Physical Activity: Insufficiently Active (07/23/2019)   Exercise Vital Sign    Days of Exercise per Week: 1 day    Minutes of Exercise per Session: 30 min  Stress: Not on file  Social Connections: Moderately Integrated (07/23/2019)   Social Connection and Isolation Panel [NHANES]    Frequency of Communication with Friends and Family: More than three times a week    Frequency of Social Gatherings with Friends and Family: Never    Attends Religious Services: More than 4 times per year    Active Member of [employer] or Organizations: No    Attends Banker Meetings: Never    Marital Status: Married  Catering manager Violence: Not At Risk (07/23/2019)   Humiliation, Afraid, Rape, and Kick questionnaire    Fear of Current or Ex-Partner: No    Emotionally Abused: No    Physically Abused: No    Sexually Abused: No    Review of Systems  Constitutional:  Positive for fatigue.  Respiratory:  Positive for apnea.   Psychiatric/Behavioral:  Positive for sleep  disturbance.     Vitals:   11/14/22 1122  BP: 128/76  Pulse: (!) 59  SpO2: 99%     Physical Exam Constitutional:      Appearance: She is obese.  HENT:     Head: Normocephalic.     Nose: No congestion.     Mouth/Throat:     Mouth: Mucous membranes are moist.  Comments: Mallampati 4, crowded oropharynx, macroglossia Eyes:     General:        Right eye: No discharge.        Left eye: No discharge.  Cardiovascular:     Rate and Rhythm: Regular rhythm. Tachycardia present.     Pulses: Normal pulses.     Heart sounds: No murmur heard.    No friction rub.  Pulmonary:     Effort: Pulmonary effort is normal. No respiratory distress.     Breath sounds: Normal breath sounds. No stridor. No wheezing or rhonchi.  Musculoskeletal:     Cervical back: No rigidity or tenderness.  Neurological:     Mental Status: She is alert.  Psychiatric:        Mood and Affect: Mood normal.    Data Reviewed: Previous sleep studies not available to be reviewed  Most recent sleep study reviewed showing moderate obstructive sleep apnea  Download from machine shows excellent compliance at 100% Average use of 7 hours Set between 5 and 20 95 percentile pressure of 11.6 Residual AHI of 3.6  Assessment:  Obstructive sleep apnea -CPAP is helping  Insomnia -Multiple agents were not helping in the past -Ambien seems to be helping  Obesity  Pathophysiology of sleep disordered breathing discussed Treatment options discussed  Plan/Recommendations:  Continue CPAP therapy  Continue Ambien  Dose of Ambien increased to 12.5  I will see you in 6 months  Encouraged to call with significant concerns   Virl Diamond MD [facility] 11/14/2022, 11:24 AM  CC: Sandford Craze, NP

## 2022-12-20 DIAGNOSIS — G4733 Obstructive sleep apnea (adult) (pediatric): Secondary | ICD-10-CM | POA: Diagnosis not present

## 2023-01-01 ENCOUNTER — Ambulatory Visit: Payer: Medicare HMO | Admitting: Obstetrics and Gynecology

## 2023-01-01 ENCOUNTER — Encounter: Payer: Self-pay | Admitting: Obstetrics and Gynecology

## 2023-01-01 VITALS — Ht 62.25 in | Wt 243.0 lb

## 2023-01-01 DIAGNOSIS — N393 Stress incontinence (female) (male): Secondary | ICD-10-CM | POA: Diagnosis not present

## 2023-01-01 DIAGNOSIS — R350 Frequency of micturition: Secondary | ICD-10-CM

## 2023-01-01 LAB — POCT URINALYSIS DIPSTICK
Bilirubin, UA: NEGATIVE
Blood, UA: NEGATIVE
Glucose, UA: NEGATIVE
Ketones, UA: NEGATIVE
Leukocytes, UA: NEGATIVE
Nitrite, UA: NEGATIVE
Protein, UA: NEGATIVE
Spec Grav, UA: 1.025 (ref 1.010–1.025)
Urobilinogen, UA: 0.2 E.U./dL
pH, UA: 6 (ref 5.0–8.0)

## 2023-01-01 NOTE — Progress Notes (Signed)
Beth [name]  Referring Provider: Debbrah Alar, NP PCP: Debbrah Alar, NP Date of Service: 01/01/2023  SUBJECTIVE Chief Complaint: New Patient (Initial Visit) Beth [name] is a 67 y.o. female here for a consult for stress incontinence./)  History of Present Illness: Beth [name] is a 67 y.o. White or Caucasian female seen in [name] at the request of NP Debbrah Alar for evaluation of incontinence.     Urinary Symptoms: Leaks urine with cough/ sneeze, laughing, exercise, and lifting. Has been going on about 2-3 years.  Denies bladder urgency.  Leaks 0-3 time(s) per day.  Pad use: 1-2 pads per day.   She is bothered by her UI symptoms.  Day time voids 4.  Nocturia: 0-1 times per night to void. Voiding dysfunction: she does not empty her bladder well.  does not use a catheter to empty bladder.  When urinating, she feels the need to urinate multiple times in a row and to push on her belly or vagina to empty bladder   UTIs: 1 UTI's in the last year.   Denies history of blood in urine and kidney or bladder stones  Pelvic Organ Prolapse Symptoms:                  She Denies a feeling of a bulge the vaginal area.   Bowel Symptom: Bowel movements: 1-2 time(s) per day Stool consistency: soft  or loose Straining: yes.  Splinting: yes.  Incomplete evacuation: no.  She Denies accidental bowel leakage / fecal incontinence Bowel regimen: none  Sexual Function Sexually active: no.    Pelvic Pain Denies pelvic pain   Past Medical History:  Past Medical History:  Diagnosis Date  . Anxiety   . Arthritis   . Depression   . Sleep apnea    does not uses CPAP; uses mouth guard.      Past Surgical History:   Past Surgical History:  Procedure Laterality Date  . ANKLE SURGERY Right 2008  . BACK SURGERY  2009  . COLONOSCOPY    . REPLACEMENT TOTAL KNEE Right 2012  . TONSILLECTOMY    . TOTAL  SHOULDER ARTHROPLASTY Right 03/28/2020   Procedure: RIGHT SHOULDER REPLACEMENT;  Surgeon: Meredith Pel, MD;  Location: [facility];  Service: Orthopedics;  Laterality: Right;     Past OB/GYN History: OB History  Gravida Para Term Preterm AB Living  2 2 2     2   SAB IAB Ectopic Multiple Live Births          2    # Outcome Date GA Lbr Len/2nd Weight Sex Delivery Anes PTL Lv  2 Term      Vag-Spont     1 Term      Vag-Spont       Menopausal: Yes, at age 89, Denies vaginal bleeding since menopause Last pap smear was 2023.  Any history of abnormal pap smears: no.   Medications: She has a current medication list which includes the following prescription(s): b complex vitamins, calcium, celecoxib, vitamin d, ferrous sulfate, glucosamine hcl-msm, calcium-magnesium-zinc, fish oil, sertraline, triamcinolone cream, and zolpidem.   Allergies: Patient has No Known Allergies.   Social History:  Social History   Tobacco Use  . Smoking status: Former    Types: Cigarettes    Quit date: 07/22/1989    Years since quitting: 33.4  . Smokeless tobacco: Never  Vaping Use  . Vaping Use: Never used  Substance Use Topics  . Alcohol use:  Yes    Comment: social, couple times a month  . Drug use: Never    Relationship status: married She lives with husband.   She is not employed. Regular exercise: No History of abuse: No  Family History:   Family History  Problem Relation Age of Onset  . Breast cancer Mother 17  . Diabetes Father   . Hydrocephalus Father   . Lupus Maternal Aunt   . Tuberculosis Maternal Grandmother   . Alcohol abuse Maternal Grandfather        suicide  . Diabetes Mellitus II Paternal Grandmother   . Peripheral vascular disease Paternal Grandmother   . Colon cancer Neg Hx   . Colon polyps Neg Hx   . Esophageal cancer Neg Hx   . Stomach cancer Neg Hx   . Rectal cancer Neg Hx      Review of Systems: ROS   OBJECTIVE Physical Exam: Vitals:   01/01/23 1044   Weight: 243 lb (110.2 kg)  Height: 5' 2.25" (1.581 m)    Physical Exam Constitutional:      General: She is not in acute distress. Pulmonary:     Effort: Pulmonary effort is normal.  Abdominal:     General: There is no distension.     Palpations: Abdomen is soft.     Tenderness: There is no abdominal tenderness. There is no rebound.  Musculoskeletal:        General: No swelling. Normal range of motion.  Skin:    General: Skin is warm and dry.     Findings: No rash.  Neurological:     Mental Status: She is alert and oriented to person, place, and time.  Psychiatric:        Mood and Affect: Mood normal.        Behavior: Behavior normal.     GU / Detailed Urogynecologic Evaluation:  Pelvic Exam: Normal external female genitalia; Bartholin's and Skene's glands normal in appearance; urethral meatus normal in appearance, no urethral masses or discharge.   CST: negative  Speculum exam reveals normal vaginal mucosa with atrophy. Cervix normal appearance. Uterus normal single, nontender. Adnexa no mass, fullness, tenderness.     Pelvic floor strength I/V  Pelvic floor musculature: Right levator non-tender, Right obturator non-tender, Left levator non-tender, Left obturator non-tender  POP-Q:   POP-Q  -3                                            Aa   -3                                           Ba  -8                                              C   3                                            Gh  4  Pb  9                                            tvl   -2.5                                            Ap  -2.5                                            Bp  -9                                              D      Rectal Exam:  Normal external rectum  Post-Void Residual (PVR) by Bladder Scan: In order to evaluate bladder emptying, we discussed obtaining a postvoid residual and she agreed to this  procedure.  Procedure: The ultrasound unit was placed on the patient's abdomen in the suprapubic region after the patient had voided. A PVR of 52 ml was obtained by bladder scan.  Laboratory Results: POC urine: negative   ASSESSMENT AND PLAN Beth [name] is a 67 y.o. with:  1. Urinary frequency    - deciding between urethral bulking and pessary. Working on losing weight. She will let us know what she decides.  - Will need simple CMG at time of bulking procedure if she wishes to proceed.    Beth Folds, MD   Medical Decision Making:  - Reviewed/ ordered a clinical laboratory test - Reviewed/ ordered a radiologic study - Reviewed/ ordered medicine test - Decision to obtain old records - Discussion of management of or test interpretation with an external physician / other healthcare professional  - Assessment requiring independent historian - Review and summation of prior records - Independent review of image, tracing or specimen

## 2023-01-02 ENCOUNTER — Encounter: Payer: Self-pay | Admitting: Obstetrics and Gynecology

## 2023-01-06 NOTE — Progress Notes (Signed)
Precertification check for Urethral Bulking Insurance: [employer] contacted CPT code: [phone] and [phone] Dx code: N39.3 (Yes No)  Prior auth needed Ref #: [id number]

## 2023-01-08 ENCOUNTER — Ambulatory Visit: Payer: Medicare Other | Admitting: Obstetrics and Gynecology

## 2023-01-08 ENCOUNTER — Other Ambulatory Visit: Payer: Self-pay | Admitting: Obstetrics and Gynecology

## 2023-01-08 DIAGNOSIS — N393 Stress incontinence (female) (male): Secondary | ICD-10-CM

## 2023-01-08 MED ORDER — SULFAMETHOXAZOLE-TRIMETHOPRIM 800-160 MG PO TABS: 1.0000 | ORAL_TABLET | Freq: Two times a day (BID) | ORAL | 0 refills | Status: AC

## 2023-01-08 NOTE — Progress Notes (Signed)
Patient has been notified

## 2023-01-10 ENCOUNTER — Other Ambulatory Visit: Payer: Self-pay

## 2023-01-10 NOTE — Telephone Encounter (Signed)
Refill request/ appointment scheduled for 01/14/23

## 2023-01-12 MED ORDER — CELECOXIB 200 MG PO CAPS: 200.0000 mg | ORAL_CAPSULE | Freq: Every day | ORAL | 1 refills | Status: DC

## 2023-01-12 MED ORDER — SERTRALINE HCL 100 MG PO TABS: 100.0000 mg | ORAL_TABLET | Freq: Every day | ORAL | 1 refills | Status: DC

## 2023-01-14 ENCOUNTER — Encounter: Payer: Self-pay | Admitting: Family

## 2023-01-14 ENCOUNTER — Ambulatory Visit (INDEPENDENT_AMBULATORY_CARE_PROVIDER_SITE_OTHER): Payer: Medicare HMO | Admitting: Family

## 2023-01-14 VITALS — BP 125/59 | HR 69 | Temp 98.0°F | Resp 16 | Wt 244.0 lb

## 2023-01-14 DIAGNOSIS — F32A Depression, unspecified: Secondary | ICD-10-CM

## 2023-01-14 DIAGNOSIS — B351 Tinea unguium: Secondary | ICD-10-CM | POA: Diagnosis not present

## 2023-01-14 DIAGNOSIS — L309 Dermatitis, unspecified: Secondary | ICD-10-CM

## 2023-01-14 DIAGNOSIS — R69 Illness, unspecified: Secondary | ICD-10-CM | POA: Diagnosis not present

## 2023-01-14 DIAGNOSIS — G4733 Obstructive sleep apnea (adult) (pediatric): Secondary | ICD-10-CM

## 2023-01-14 MED ORDER — TERBINAFINE HCL 250 MG PO TABS: 250.0000 mg | ORAL_TABLET | Freq: Every day | ORAL | 0 refills | Status: DC

## 2023-01-14 MED ORDER — TRIAMCINOLONE ACETONIDE 0.1 % EX CREA: 1.0000 | TOPICAL_CREAM | Freq: Two times a day (BID) | CUTANEOUS | 0 refills | Status: DC

## 2023-01-14 NOTE — Assessment & Plan Note (Signed)
Feeling well on cpap.

## 2023-01-14 NOTE — Assessment & Plan Note (Signed)
New. Will rx with Lamisil, repeat LFT in 1 month.

## 2023-01-14 NOTE — Assessment & Plan Note (Signed)
Of left ear canal- has used triamcinolone cream with good response.  Refill provided.

## 2023-01-14 NOTE — Assessment & Plan Note (Signed)
Continues ambien- stable. OK with side effects for now. Monitor.

## 2023-01-14 NOTE — Progress Notes (Signed)
Subjective:   By signing my name below, I, Beth [name], attest that this documentation has been prepared under the direction and in the presence of Debbrah Alar, NP. 01/14/2023.   Patient ID: Beth [name], female    DOB: 09/09/1956, 66 y.o.   MRN: [id number]  Chief Complaint  Patient presents with   Insomnia    "Doing well on medication"    Depression    Patient reports increased symptoms    Sleep Apnea    Follow up, "doing well"    Insomnia  Depression        Associated symptoms include no myalgias.  Patient is in today for an office visit.  Mood: At times she states that she sometimes "doesn't feel right," occasionally with a lack of motivation. She has noticed these symptoms after forgetting to take her medications, but she has also felt this way at times with her medications. She is in the process of adding an addition to the garage which will be her sewing studio. She believes this is contributing to her stress and above symptoms, and will hopefully improve once the addition has been completed.  Memory Loss: She is taking ambien nightly, which is helping her sleep. However, she complains of short-term memory loss as a side effect. Sometimes she will not remember what she said, or may see a food wrapper and not recall that she ate something. She has been using her CPAP consistently.  Right Toe: She would like to pursue procedural treatment for her right 2nd toe, which is noted to be hypertrophic and discolored on examination today.  Left ear: Additionally she complains of dry, pruritic skin of her left ear.  Weight Loss: She continues to work on weight loss. This is required before she will be able to have her knee replacement surgery.  Vaccinations: She presents her vaccination card confirming that she received a COVID booster 11/25/2022.   Denies having any fever, new muscle pain, joint pain , new moles, congestion, sinus pain, sore throat, chest pain,  palpations, cough, SOB ,wheezing,n/v/d constipation, blood in stool, dysuria, frequency, hematuria, at this time  Past Medical History:  Diagnosis Date   Anxiety    Arthritis    Depression    Sleep apnea    does not uses CPAP; uses mouth guard.     Past Surgical History:  Procedure Laterality Date   ANKLE SURGERY Right 2008   BACK SURGERY  2009   COLONOSCOPY     REPLACEMENT TOTAL KNEE Right 2012   TONSILLECTOMY     TOTAL SHOULDER ARTHROPLASTY Right 03/28/2020   Procedure: RIGHT SHOULDER REPLACEMENT;  Surgeon: Meredith Pel, MD;  Location: [facility];  Service: Orthopedics;  Laterality: Right;    Family History  Problem Relation Age of Onset   Breast cancer Mother 42   Diabetes Father    Hydrocephalus Father    Lupus Maternal Aunt    Tuberculosis Maternal Grandmother    Alcohol abuse Maternal Grandfather        suicide   Diabetes Mellitus II Paternal Grandmother    Peripheral vascular disease Paternal Grandmother    Colon cancer Neg Hx    Colon polyps Neg Hx    Esophageal cancer Neg Hx    Stomach cancer Neg Hx    Rectal cancer Neg Hx     Social History   Socioeconomic History   Marital status: Married    Spouse name: Ron   Number of children: 2   Years of education:  Not on file   Highest education level: Not on file  Occupational History   Occupation: retired  Tobacco Use   Smoking status: Former    Types: Cigarettes    Quit date: 07/22/1989    Years since quitting: 33.5   Smokeless tobacco: Never  Vaping Use   Vaping Use: Never used  Substance and Sexual Activity   Alcohol use: Yes    Comment: social, couple times a month   Drug use: Never   Sexual activity: Yes    Partners: Male  Other Topics Concern   Not on file  Social History Narrative   Retired Garment/textile technologist   Divorced (from [place] originally)   Remarried 2015 (husband is a Pharmacist, community)   Has a dog   2 children (son and daughter) both married, daughter lives in [place], son in [place]. No grandchildren    Enjoys baking, sewing, reading   Social Determinants of Health   Financial Resource Strain: Low Risk  (07/23/2019)   Overall Financial Resource Strain (CARDIA)    Difficulty of Paying Living Expenses: Not hard at all  Food Insecurity: No Food Insecurity (07/23/2019)   Hunger Vital Sign    Worried About Running Out of Food in the Last Year: Never true    [place] in the Last Year: Never true  Transportation Needs: Unknown (07/23/2019)   PRAPARE - Hydrologist (Medical): Not on file    Lack of Transportation (Non-Medical): No  Physical Activity: Insufficiently Active (07/23/2019)   Exercise Vital Sign    Days of Exercise per Week: 1 day    Minutes of Exercise per Session: 30 min  Stress: Not on file  Social Connections: Moderately Integrated (07/23/2019)   Social Connection and Isolation Panel [NHANES]    Frequency of Communication with Friends and Family: More than three times a week    Frequency of Social Gatherings with Friends and Family: Never    Attends Religious Services: More than 4 times per year    Active Member of [employer] or Organizations: No    Attends Archivist Meetings: Never    Marital Status: Married  Human resources officer Violence: Not At Risk (07/23/2019)   Humiliation, Afraid, Rape, and Kick questionnaire    Fear of Current or Ex-Partner: No    Emotionally Abused: No    Physically Abused: No    Sexually Abused: No    Outpatient Medications Prior to Visit  Medication Sig Dispense Refill   b complex vitamins tablet Take 1 tablet by mouth daily.     CALCIUM PO Take 1 tablet by mouth daily.     celecoxib (CELEBREX) 200 MG capsule Take 1 capsule (200 mg total) by mouth daily. 90 capsule 1   Cholecalciferol (VITAMIN D) 50 MCG (2000 UT) CAPS Take 2,000 Units by mouth daily.     ferrous sulfate 325 (65 FE) MG tablet Take 325 mg by mouth daily with breakfast.     Glucosamine HCl-MSM (GLUCOSAMINE-MSM PO) Take 2 tablets by mouth  daily.     Multiple Minerals (CALCIUM-MAGNESIUM-ZINC) TABS Take 3 tablets by mouth daily.     Omega-3 Fatty Acids (FISH OIL) 1000 MG CAPS Take 3 capsules (3,000 mg total) by mouth in the morning and at bedtime.  0   sertraline (ZOLOFT) 100 MG tablet Take 1 tablet (100 mg total) by mouth daily. 90 tablet 1   zolpidem (AMBIEN CR) 12.5 MG CR tablet Take 1 tablet (12.5 mg total) by mouth  at bedtime as needed for sleep. 30 tablet 2   triamcinolone (KENALOG) 0.1 % Apply 1 application topically 2 (two) times daily. To ear canals 30 g 0   No facility-administered medications prior to visit.    No Known Allergies  Review of Systems  Constitutional:  Negative for fever.  HENT:  Positive for ear pain (Dryness and pruritus of left ear). Negative for congestion, sinus pain and sore throat.   Respiratory:  Negative for cough, shortness of breath and wheezing.   Cardiovascular:  Negative for chest pain and palpitations.  Gastrointestinal:  Negative for blood in stool, constipation, diarrhea, nausea and vomiting.  Genitourinary:  Negative for dysuria, frequency and hematuria.  Musculoskeletal:  Negative for joint pain and myalgias.  Psychiatric/Behavioral:  Positive for memory loss.        Objective:    Physical Exam Constitutional:      Appearance: Normal appearance.  HENT:     Head: Normocephalic and atraumatic.     Right Ear: Tympanic membrane, ear canal and external ear normal.     Left Ear: Tympanic membrane, ear canal and external ear normal.  Eyes:     Extraocular Movements: Extraocular movements intact.     Pupils: Pupils are equal, round, and reactive to light.  Cardiovascular:     Rate and Rhythm: Normal rate and regular rhythm.     Heart sounds: Normal heart sounds. No murmur heard.    No gallop.  Pulmonary:     Effort: Pulmonary effort is normal. No respiratory distress.     Breath sounds: Normal breath sounds. No wheezing or rales.  Feet:     Comments: Hypertrophic right  second toenail with discoloration. Skin:    General: Skin is warm and dry.  Neurological:     General: No focal deficit present.     Mental Status: She is alert and oriented to person, place, and time.  Psychiatric:        Mood and Affect: Mood normal.        Behavior: Behavior normal.     BP (!) 125/59 (BP Location: Right Arm, Patient Position: Sitting, Cuff Size: Large)   Pulse 69   Temp 98 F (36.7 C) (Oral)   Resp 16   Wt 244 lb (110.7 kg)   SpO2 97%   BMI 44.27 kg/m  Wt Readings from Last 3 Encounters:  01/14/23 244 lb (110.7 kg)  01/01/23 243 lb (110.2 kg)  11/14/22 240 lb (108.9 kg)      Assessment & Plan:   Problem List Items Addressed This Visit       Unprioritized   OSA (obstructive sleep apnea)    Feeling well on cpap.       Onychomycosis    New. Will rx with Lamisil, repeat LFT in 1 month.       Relevant Medications   terbinafine (LAMISIL) 250 MG tablet   Other Relevant Orders   Hepatic function panel   Eczema    Of left ear canal- has used triamcinolone cream with good response.  Refill provided.       Relevant Medications   triamcinolone cream (KENALOG) 0.1 %   Depression - Primary    Feels like her stress is situational. Will continue zoloft for now and she will let me know if her symptoms do not improve.         Meds ordered this encounter  Medications   terbinafine (LAMISIL) 250 MG tablet    Sig: Take 1 tablet (  250 mg total) by mouth daily.    Dispense:  30 tablet    Refill:  0    Order Specific Question:   Supervising Provider    Answer:   Penni Homans A [[id number]]   triamcinolone cream (KENALOG) 0.1 %    Sig: Apply 1 Application topically 2 (two) times daily. To ear canals    Dispense:  30 g    Refill:  0    Order Specific Question:   Supervising Provider    Answer:   Penni Homans A [[id number]]    I, Nance Pear, NP, personally preformed the services described in this documentation.  All medical record entries made by the  scribe were at my direction and in my presence.  I have reviewed the chart and discharge instructions (if applicable) and agree that the record reflects my personal performance and is accurate and complete. 01/14/2023.  I,Mathew Stumpf,acting as a Education administrator for [employer], NP.,have documented all relevant documentation on the behalf of Nance Pear, NP,as directed by  Nance Pear, NP while in the presence of Nance Pear, NP.   Nance Pear, NP

## 2023-01-14 NOTE — Assessment & Plan Note (Signed)
Feels like her stress is situational. Will continue zoloft for now and she will let me know if her symptoms do not improve.

## 2023-01-20 ENCOUNTER — Encounter: Payer: Self-pay | Admitting: Physician Assistant

## 2023-01-20 ENCOUNTER — Ambulatory Visit (INDEPENDENT_AMBULATORY_CARE_PROVIDER_SITE_OTHER): Payer: Medicare HMO | Admitting: Physician Assistant

## 2023-01-20 DIAGNOSIS — G8929 Other chronic pain: Secondary | ICD-10-CM | POA: Diagnosis not present

## 2023-01-20 DIAGNOSIS — M25562 Pain in left knee: Secondary | ICD-10-CM

## 2023-01-20 DIAGNOSIS — G4733 Obstructive sleep apnea (adult) (pediatric): Secondary | ICD-10-CM | POA: Diagnosis not present

## 2023-01-20 MED ORDER — BUPIVACAINE HCL 0.25 % IJ SOLN
2.0000 mL | INTRAMUSCULAR | Status: AC | PRN
  Administered 2023-01-20: 2 mL via INTRA_ARTICULAR

## 2023-01-20 MED ORDER — METHYLPREDNISOLONE ACETATE 40 MG/ML IJ SUSP
80.0000 mg | INTRAMUSCULAR | Status: AC | PRN
  Administered 2023-01-20: 80 mg via INTRA_ARTICULAR

## 2023-01-20 MED ORDER — LIDOCAINE HCL 1 % IJ SOLN
2.0000 mL | INTRAMUSCULAR | Status: AC | PRN
  Administered 2023-01-20: 2 mL

## 2023-01-20 NOTE — Progress Notes (Signed)
Office Visit Note   Patient: Beth [name]           Date of Birth: Apr 26, 1956           MRN: [id number] Visit Date: 01/20/2023              Requested by: Debbrah Alar, NP [address] [place],  [place] [id number] PCP: Debbrah Alar, NP  Chief Complaint  Patient presents with  . Left Knee - Pain      HPI: Kristanna is a pleasant 67 year old woman who periodically comes in for an injection into her left knee.  Last 1 was October 23.  She denies any injury.  She is trying to lose weight to qualify for knee replacement  Assessment & Plan: Visit Diagnoses: Osteoarthritis left knee  Plan: Will go forward with a steroid injection today May follow-up as needed  Follow-Up Instructions: As needed  Ortho Exam  Patient is alert, oriented, no adenopathy, well-dressed, normal affect, normal respiratory effort. Left knee no effusion no erythema no warmth.  Compartments of the lower leg are soft and nontender she is neurovascular intact  Imaging: No results found. No images are attached to the encounter.  Labs: Lab Results  Component Value Date   REPTSTATUS 03/25/2020 FINAL 03/24/2020   CULT (A) 03/24/2020    30,000 COLONIES/mL STREPTOCOCCUS AGALACTIAE TESTING AGAINST S. AGALACTIAE NOT ROUTINELY PERFORMED DUE TO PREDICTABILITY OF AMP/PEN/VAN SUSCEPTIBILITY. Performed at [facility], [place] [address], [place], [place] [address]      Lab Results  Component Value Date   ALBUMIN 4.3 07/16/2022   ALBUMIN 4.4 11/27/2020   ALBUMIN 4.3 10/26/2019    No results found for: "MG" No results found for: "VD25OH"  No results found for: "PREALBUMIN"    Latest Ref Rng & Units 03/24/2020   10:54 AM 10/26/2019   11:19 AM  CBC EXTENDED  WBC 4.0 - 10.5 K/uL 8.6  8.1   RBC 3.87 - 5.11 MIL/uL 4.25  4.17   Hemoglobin 12.0 - 15.0 g/dL 12.9  12.7   HCT 36.0 - 46.0 % 40.9  38.8   Platelets 150 - 400 K/uL 311  279.0   NEUT# 1.4 - 7.7 K/uL  5.5   Lymph# 0.7 -  4.0 K/uL  1.9      There is no height or weight on file to calculate BMI.  Orders:  No orders of the defined types were placed in this encounter.  No orders of the defined types were placed in this encounter.    Procedures: Large Joint Inj on 01/20/2023 12:55 PM Indications: pain and diagnostic evaluation Details: 25 G 1.5 in needle, anteromedial approach  Arthrogram: No  Medications: 80 mg methylPREDNISolone acetate 40 MG/ML; 2 mL lidocaine 1 %; 2 mL bupivacaine 0.25 % Outcome: tolerated well, no immediate complications Procedure, treatment alternatives, risks and benefits explained, specific risks discussed. Consent was given by the patient.    Clinical Data: No additional findings.  ROS:  All other systems negative, except as noted in the HPI. Review of Systems  Objective: Vital Signs: There were no vitals taken for this visit.  Specialty Comments:  No specialty comments available.  PMFS History: Patient Active Problem List   Diagnosis Date Noted  . Onychomycosis 01/14/2023  . Eczema 01/14/2023  . Hypertriglyceridemia 07/17/2022  . Hyperglycemia 07/17/2022  . Bilateral primary osteoarthritis of knee 01/17/2022  . Chronic pain of left knee 01/15/2022  . Bilateral carpal tunnel syndrome 01/15/2022  .  Microscopic hematuria 03/04/2021  . OSA (obstructive sleep apnea) 07/27/2020  . Insomnia 04/25/2020  . Restless leg 04/25/2020  . Depression 04/25/2020  . Arthritis of shoulder 03/28/2020  . Primary osteoarthritis, right shoulder 01/11/2020   Past Medical History:  Diagnosis Date  . Anxiety   . Arthritis   . Depression   . Sleep apnea    does not uses CPAP; uses mouth guard.     Family History  Problem Relation Age of Onset  . Breast cancer Mother 23  . Diabetes Father   . Hydrocephalus Father   . Lupus Maternal Aunt   . Tuberculosis Maternal Grandmother   . Alcohol abuse Maternal Grandfather        suicide  . Diabetes Mellitus II Paternal  Grandmother   . Peripheral vascular disease Paternal Grandmother   . Colon cancer Neg Hx   . Colon polyps Neg Hx   . Esophageal cancer Neg Hx   . Stomach cancer Neg Hx   . Rectal cancer Neg Hx     Past Surgical History:  Procedure Laterality Date  . ANKLE SURGERY Right 2008  . BACK SURGERY  2009  . COLONOSCOPY    . REPLACEMENT TOTAL KNEE Right 2012  . TONSILLECTOMY    . TOTAL SHOULDER ARTHROPLASTY Right 03/28/2020   Procedure: RIGHT SHOULDER REPLACEMENT;  Surgeon: Meredith Pel, MD;  Location: [facility];  Service: Orthopedics;  Laterality: Right;   Social History   Occupational History  . Occupation: retired  Tobacco Use  . Smoking status: Former    Types: Cigarettes    Quit date: 07/22/1989    Years since quitting: 33.5  . Smokeless tobacco: Never  Vaping Use  . Vaping Use: Never used  Substance and Sexual Activity  . Alcohol use: Yes    Comment: social, couple times a month  . Drug use: Never  . Sexual activity: Yes    Partners: Male

## 2023-01-27 ENCOUNTER — Encounter: Payer: Self-pay | Admitting: *Deleted

## 2023-01-30 ENCOUNTER — Encounter: Payer: Self-pay | Admitting: Obstetrics and Gynecology

## 2023-01-30 ENCOUNTER — Ambulatory Visit: Payer: Medicare HMO | Admitting: Obstetrics and Gynecology

## 2023-01-30 VITALS — BP 116/73 | HR 70

## 2023-01-30 DIAGNOSIS — R350 Frequency of micturition: Secondary | ICD-10-CM

## 2023-01-30 DIAGNOSIS — N393 Stress incontinence (female) (male): Secondary | ICD-10-CM | POA: Diagnosis not present

## 2023-01-30 LAB — POCT URINALYSIS DIPSTICK
Bilirubin, UA: NEGATIVE
Blood, UA: NEGATIVE
Glucose, UA: NEGATIVE
Ketones, UA: NEGATIVE
Leukocytes, UA: NEGATIVE
Nitrite, UA: NEGATIVE
Protein, UA: NEGATIVE
Spec Grav, UA: >=1.03 — AB (ref 1.010–1.025)
Urobilinogen, UA: 0.2 E.U./dL
pH, UA: 6.5 (ref 5.0–8.0)

## 2023-01-30 NOTE — Patient Instructions (Signed)
Taking Care of Yourself after Bulkamid Injection   Drink plenty of water for a day or two following your procedure. Try to have about 8 ounces (one cup) at a time, and do this 6 times or more per day unless you have fluid restrictitons AVOID irritative beverages such as coffee, tea, soda, alcoholic or citrus drinks for a day or two, as this may cause burning with urination.  For the first 1-2 days after the procedure, your urine may be pink or red in color. You may have some blood in your urine as a normal side effect of the procedure. Large amounts of bleeding or difficulty urinating are NOT normal. Call the nurse line if this happens or go to the nearest Emergency Room if the bleeding is heavy or you cannot urinate at all and it is after hours. If you had a Bulkamid injection in the urethra and need to be catheterized, ask for a pediatric catheter to be used (size 10 or 12-French) so the material is not pushed out of place.   You may experience some discomfort or a burning sensation with urination after having this procedure. You can use over the counter Azo or pyridium to help with burning and follow the instructions on the packaging. If it does not improve within 1-2 days, or other symptoms appear (fever, chills, or difficulty urinating) call the office to speak to a nurse.  You may return to normal daily activities such as work, school, driving, exercising and housework on the day of the procedure. If your doctor gave you a prescription, take it as ordered.

## 2023-01-30 NOTE — Progress Notes (Signed)
Bulkamid Injection  CC: 67 y.o. y.o. F with stress incontinence who presents for transurethral Bulkamid injection.  Patient signed her consent form.  She started antibiotic prophylaxis today.  Procedure: Time out was performed. The bladder was catheterized and 10 ml of 2% lidocaine jelly placed in the urethra. A urethral block was performed by injecting 3m of 1% lidocaine with epinephrine at 3 and 6 o'clock adjacent to the urethra.  The needle was primed.  The cystoscope was inserted to the level of the bladder neck.  The needle was inserted 2 cm and the scope was pulled back into the urethra 2 cm.  The needle was inserted bevel up at the 5 o'clock position and the Bulkamid was injected to obtain coaptation.  This was repeated at the 2 o'clock,  10 o'clock and 7 o'clock positions.   A total of 2- 164msyringes were used and good circumferential coaptation was noted.  The patient tolerated the procedure well. She was asked to void after the procedure.  Post-Void Residual (PVR) by Bladder Scan: In order to evaluate bladder emptying, we discussed obtaining a postvoid residual and she agreed to this procedure.  Procedure: The ultrasound unit was placed on the patient's abdomen in the suprapubic region after the patient had voided. A PVR of 13 ml was obtained by bladder scan.  ASSESSMENT: [age].o. y.o. s/p transurethral Bulkamid injection for stress incontinence.   PLAN: Patient will follow up in 4 weeks to reassess. Voiding and post-procedure precautions were given. She will return for heavy bleeding, fevers, dysuria lasting beyond today and incomplete emptying.  All questions were answered.  MiJaquita FoldsMD

## 2023-01-30 NOTE — Progress Notes (Signed)
Verbal consent was obtained to perform simple CMG procedure:   Prolapse was reduced using 2 large cotton swabs. Urethra was prepped with betadine and a 74F catheter was placed and bladder was drained completely. The bladder was then backfilled with sterile water by gravity.  First sensation: 100 First Desire: 150 Strong Desire: 210 Capacity: 325 Cough stress test was positive. Valsalva stress test was not done.  Mild DO was noted Catheter was replaced to drain bladder.   Interpretation: CMG showed normal sensation, and normal cystometric capacity. Findings positive for stress incontinence, positive for mild detrusor overactivity.

## 2023-02-11 ENCOUNTER — Other Ambulatory Visit: Payer: Medicare HMO

## 2023-02-12 ENCOUNTER — Other Ambulatory Visit (INDEPENDENT_AMBULATORY_CARE_PROVIDER_SITE_OTHER): Payer: Medicare HMO

## 2023-02-12 ENCOUNTER — Telehealth: Payer: Self-pay | Admitting: Family

## 2023-02-12 DIAGNOSIS — B351 Tinea unguium: Secondary | ICD-10-CM

## 2023-02-12 LAB — HEPATIC FUNCTION PANEL
ALT: 22 U/L (ref 0–35)
AST: 18 U/L (ref 0–37)
Albumin: 3.9 g/dL (ref 3.5–5.2)
Alkaline Phosphatase: 93 U/L (ref 39–117)
Bilirubin, Direct: 0.1 mg/dL (ref 0.0–0.3)
Total Bilirubin: 0.5 mg/dL (ref 0.2–1.2)
Total Protein: 6.1 g/dL (ref 6.0–8.3)

## 2023-02-12 MED ORDER — TERBINAFINE HCL 250 MG PO TABS: 250.0000 mg | ORAL_TABLET | Freq: Every day | ORAL | 0 refills | Status: DC

## 2023-02-12 NOTE — Telephone Encounter (Signed)
See mychart.  

## 2023-02-18 DIAGNOSIS — G4733 Obstructive sleep apnea (adult) (pediatric): Secondary | ICD-10-CM | POA: Diagnosis not present

## 2023-02-24 ENCOUNTER — Other Ambulatory Visit: Payer: Self-pay | Admitting: Family

## 2023-02-24 ENCOUNTER — Other Ambulatory Visit: Payer: Self-pay | Admitting: Pulmonary Disease

## 2023-02-24 DIAGNOSIS — B351 Tinea unguium: Secondary | ICD-10-CM

## 2023-02-25 NOTE — Telephone Encounter (Signed)
Please advise on refill request

## 2023-03-05 ENCOUNTER — Ambulatory Visit: Payer: Medicare HMO | Admitting: Obstetrics and Gynecology

## 2023-03-05 ENCOUNTER — Encounter: Payer: Self-pay | Admitting: Obstetrics and Gynecology

## 2023-03-05 VITALS — BP 128/82 | HR 93

## 2023-03-05 DIAGNOSIS — N393 Stress incontinence (female) (male): Secondary | ICD-10-CM | POA: Diagnosis not present

## 2023-03-05 NOTE — Progress Notes (Signed)
[facility] Return Visit  SUBJECTIVE  History of Present Illness: Beth [name] is a 67 y.o. female with SUI. She underwent urethral bulking (Bulkamid) on 01/30/23.   She feels that the procedure was very successful. She only had 2 episodes of small leakage in the last month with a really strong cough and sneeze. She no longer needs pads. Denies urgency or frequency. She is emptying her bladder well although the first few days urine stream was a little slow. Denies dysuria.   Past Medical History: Patient  has a past medical history of Anxiety, Arthritis, Depression, and Sleep apnea.   Past Surgical History: She  has a past surgical history that includes Back surgery (2009); Replacement total knee (Right, 2012); Ankle surgery (Right, 2008); Tonsillectomy; Total shoulder arthroplasty (Right, 03/28/2020); and Colonoscopy.   Medications: She has a current medication list which includes the following prescription(s): b complex vitamins, calcium, celecoxib, vitamin d, ferrous sulfate, calcium-magnesium-zinc, fish oil, sertraline, terbinafine, triamcinolone cream, and zolpidem.   Allergies: Patient has No Known Allergies.   Social History: Patient  reports that she quit smoking about 33 years ago. Her smoking use included cigarettes. She has never used smokeless tobacco. She reports current alcohol use. She reports that she does not use drugs.      OBJECTIVE     Physical Exam: Vitals:   03/05/23 1011  BP: 128/82  Pulse: 93   Gen: No apparent distress, A&O x 3.  Detailed Urogynecologic Evaluation:  Deferred.    ASSESSMENT AND PLAN    Beth [name] is a 67 y.o. with:  1. SUI (stress urinary incontinence, female)    - Has had good success with urethral bulking. We discussed that this is not a permanent procedure and she will likely need retreatment in several years.  - Can follow up as needed  Jaquita Folds, MD  Time spent: I spent 10 minutes dedicated to  the care of this patient on the date of this encounter to include pre-visit review of records, face-to-face time with the patient and post visit documentation.

## 2023-03-10 DIAGNOSIS — H25013 Cortical age-related cataract, bilateral: Secondary | ICD-10-CM | POA: Diagnosis not present

## 2023-03-10 DIAGNOSIS — D3131 Benign neoplasm of right choroid: Secondary | ICD-10-CM | POA: Diagnosis not present

## 2023-03-17 DIAGNOSIS — G4733 Obstructive sleep apnea (adult) (pediatric): Secondary | ICD-10-CM | POA: Diagnosis not present

## 2023-03-18 DIAGNOSIS — G4733 Obstructive sleep apnea (adult) (pediatric): Secondary | ICD-10-CM | POA: Diagnosis not present

## 2023-03-21 DIAGNOSIS — G4733 Obstructive sleep apnea (adult) (pediatric): Secondary | ICD-10-CM | POA: Diagnosis not present

## 2023-03-27 ENCOUNTER — Other Ambulatory Visit: Payer: Self-pay | Admitting: Family

## 2023-03-27 DIAGNOSIS — L309 Dermatitis, unspecified: Secondary | ICD-10-CM

## 2023-03-27 DIAGNOSIS — B351 Tinea unguium: Secondary | ICD-10-CM

## 2023-04-15 ENCOUNTER — Ambulatory Visit (INDEPENDENT_AMBULATORY_CARE_PROVIDER_SITE_OTHER): Payer: Medicare HMO | Admitting: Family

## 2023-04-15 VITALS — BP 131/56 | HR 80 | Temp 97.9°F | Resp 16 | Wt 252.0 lb

## 2023-04-15 DIAGNOSIS — G4700 Insomnia, unspecified: Secondary | ICD-10-CM | POA: Diagnosis not present

## 2023-04-15 DIAGNOSIS — B351 Tinea unguium: Secondary | ICD-10-CM | POA: Diagnosis not present

## 2023-04-15 DIAGNOSIS — F32A Depression, unspecified: Secondary | ICD-10-CM | POA: Diagnosis not present

## 2023-04-15 DIAGNOSIS — M170 Bilateral primary osteoarthritis of knee: Secondary | ICD-10-CM | POA: Diagnosis not present

## 2023-04-15 NOTE — Assessment & Plan Note (Signed)
Improving, completed lamisil.

## 2023-04-15 NOTE — Assessment & Plan Note (Signed)
Improving.  Continues zoloft.

## 2023-04-15 NOTE — Assessment & Plan Note (Signed)
Maintained on ambien CR per sleep specialist.

## 2023-04-15 NOTE — Progress Notes (Signed)
Subjective:   By signing my name below, I, Isabelle Course, attest that this documentation has been prepared under the direction and in the presence of Lemont Fillers, NP 04/15/23   Patient ID: Beth [name], female    DOB: 11/28/1956, 67 y.o.   MRN: [id number]  Chief Complaint  Patient presents with   Depression    "Doing well"   Insomnia    "Able to sleep ok with medication"    HPI Patient is in today for a 3 month follow up.   Mood: She has been compliant with 100 mg Zoloft. She previously had home projects being done, which contributed to her stress.   Right toe: She was compliant with her 250 mg Lamisil and has since finished it. She has noticed improvement in the new growth of her thickened right 2nd toe.  Insomnia: She has been compliant with 12.5 mg Ambien.   Past Medical History:  Diagnosis Date   Anxiety    Arthritis    Depression    Sleep apnea    does not uses CPAP; uses mouth guard.     Past Surgical History:  Procedure Laterality Date   ANKLE SURGERY Right 2008   BACK SURGERY  2009   COLONOSCOPY     REPLACEMENT TOTAL KNEE Right 2012   TONSILLECTOMY     TOTAL SHOULDER ARTHROPLASTY Right 03/28/2020   Procedure: RIGHT SHOULDER REPLACEMENT;  Surgeon: Cammy Copa, MD;  Location: [facility] OR;  Service: Orthopedics;  Laterality: Right;    Family History  Problem Relation Age of Onset   Breast cancer Mother 52   Diabetes Father    Hydrocephalus Father    Lupus Maternal Aunt    Tuberculosis Maternal Grandmother    Alcohol abuse Maternal Grandfather        suicide   Diabetes Mellitus II Paternal Grandmother    Peripheral vascular disease Paternal Grandmother    Colon cancer Neg Hx    Colon polyps Neg Hx    Esophageal cancer Neg Hx    Stomach cancer Neg Hx    Rectal cancer Neg Hx     Social History   Socioeconomic History   Marital status: Married    Spouse name: Ron   Number of children: 2   Years of education: Not on file   Highest  education level: Not on file  Occupational History   Occupation: retired  Tobacco Use   Smoking status: Former    Types: Cigarettes    Quit date: 07/22/1989    Years since quitting: 33.7   Smokeless tobacco: Never  Vaping Use   Vaping Use: Never used  Substance and Sexual Activity   Alcohol use: Yes    Comment: social, couple times a month   Drug use: Never   Sexual activity: Yes    Partners: Male  Other Topics Concern   Not on file  Social History Narrative   Retired Music therapist   Divorced (from [place] originally)   Remarried 2015 (husband is a IT trainer)   Has a dog   2 children (son and daughter) both married, daughter lives in [place], son in [place]. No grandchildren   Enjoys baking, sewing, reading   Social Determinants of Health   Financial Resource Strain: Low Risk  (07/23/2019)   Overall Financial Resource Strain (CARDIA)    Difficulty of Paying Living Expenses: Not hard at all  Food Insecurity: No Food Insecurity (07/23/2019)   Hunger Vital Sign    Worried About Running Out  of Food in the Last Year: Never true    Ran Out of Food in the Last Year: Never true  Transportation Needs: Unknown (07/23/2019)   PRAPARE - Administrator, Civil Service (Medical): Not on file    Lack of Transportation (Non-Medical): No  Physical Activity: Insufficiently Active (07/23/2019)   Exercise Vital Sign    Days of Exercise per Week: 1 day    Minutes of Exercise per Session: 30 min  Stress: Not on file  Social Connections: Moderately Integrated (07/23/2019)   Social Connection and Isolation Panel [NHANES]    Frequency of Communication with Friends and Family: More than three times a week    Frequency of Social Gatherings with Friends and Family: Never    Attends Religious Services: More than 4 times per year    Active Member of [employer] or Organizations: No    Attends Banker Meetings: Never    Marital Status: Married  Catering manager Violence: Not At Risk (07/23/2019)    Humiliation, Afraid, Rape, and Kick questionnaire    Fear of Current or Ex-Partner: No    Emotionally Abused: No    Physically Abused: No    Sexually Abused: No    Outpatient Medications Prior to Visit  Medication Sig Dispense Refill   b complex vitamins tablet Take 1 tablet by mouth daily.     CALCIUM PO Take 1 tablet by mouth daily.     celecoxib (CELEBREX) 200 MG capsule Take 1 capsule (200 mg total) by mouth daily. 90 capsule 1   Cholecalciferol (VITAMIN D) 50 MCG (2000 UT) CAPS Take 2,000 Units by mouth daily.     ferrous sulfate 325 (65 FE) MG tablet Take 325 mg by mouth daily with breakfast.     Multiple Minerals (CALCIUM-MAGNESIUM-ZINC) TABS Take 3 tablets by mouth daily.     Omega-3 Fatty Acids (FISH OIL) 1000 MG CAPS Take 3 capsules (3,000 mg total) by mouth in the morning and at bedtime.  0   sertraline (ZOLOFT) 100 MG tablet Take 1 tablet (100 mg total) by mouth daily. 90 tablet 1   triamcinolone cream (KENALOG) 0.1 % APPLY 1 APPLICATION TOPICALLY 2 (TWO) TIMES DAILY. TO EAR CANALS 30 g 0   zolpidem (AMBIEN CR) 12.5 MG CR tablet TAKE 1 TABLET BY MOUTH EVERY DAY AT BEDTIME AS NEEDED FOR SLEEP 30 tablet 2   terbinafine (LAMISIL) 250 MG tablet Take 1 tablet (250 mg total) by mouth daily. 60 tablet 0   No facility-administered medications prior to visit.    No Known Allergies  ROS See HPI    Objective:    Physical Exam Constitutional:      General: She is not in acute distress.    Appearance: Normal appearance. She is not ill-appearing.  HENT:     Head: Normocephalic and atraumatic.     Right Ear: External ear normal.     Left Ear: External ear normal.  Eyes:     Extraocular Movements: Extraocular movements intact.     Pupils: Pupils are equal, round, and reactive to light.  Cardiovascular:     Rate and Rhythm: Normal rate and regular rhythm.     Heart sounds: Normal heart sounds. No murmur heard.    No gallop.  Pulmonary:     Effort: Pulmonary effort is  normal. No respiratory distress.     Breath sounds: Normal breath sounds. No wheezing or rales.  Skin:    General: Skin is warm and dry.  Comments: Right second toe is noted to have less thickness in left second toe  Neurological:     Mental Status: She is alert and oriented to person, place, and time.  Psychiatric:        Judgment: Judgment normal.     BP (!) 131/56 (BP Location: Right Arm, Patient Position: Sitting, Cuff Size: Large)   Pulse 80   Temp 97.9 F (36.6 C) (Oral)   Resp 16   Wt 252 lb (114.3 kg)   SpO2 98%   BMI 45.72 kg/m  Wt Readings from Last 3 Encounters:  04/15/23 252 lb (114.3 kg)  01/14/23 244 lb (110.7 kg)  01/01/23 243 lb (110.2 kg)       Assessment & Plan:  Onychomycosis Assessment & Plan: Improving, completed lamisil.    Depression, unspecified depression type Assessment & Plan: Improving.  Continues zoloft.     Bilateral primary osteoarthritis of knee Assessment & Plan: Continues celebrex which she finds helpful.    Insomnia, unspecified type Assessment & Plan: Maintained on ambien CR per sleep specialist.         I,Rachel Rivera,acting as a scribe for Lemont Fillers, NP.,have documented all relevant documentation on the behalf of Lemont Fillers, NP,as directed by  Lemont Fillers, NP while in the presence of Lemont Fillers, NP.   I, Lemont Fillers, NP, personally preformed the services described in this documentation.  All medical record entries made by the scribe were at my direction and in my presence.  I have reviewed the chart and discharge instructions (if applicable) and agree that the record reflects my personal performance and is accurate and complete. 04/15/23   Lemont Fillers, NP

## 2023-04-15 NOTE — Assessment & Plan Note (Signed)
Continues celebrex which she finds helpful.

## 2023-04-17 DIAGNOSIS — G4733 Obstructive sleep apnea (adult) (pediatric): Secondary | ICD-10-CM | POA: Diagnosis not present

## 2023-04-20 DIAGNOSIS — G4733 Obstructive sleep apnea (adult) (pediatric): Secondary | ICD-10-CM | POA: Diagnosis not present

## 2023-04-21 ENCOUNTER — Encounter: Payer: Self-pay | Admitting: Physician Assistant

## 2023-04-21 ENCOUNTER — Ambulatory Visit: Payer: Medicare HMO | Admitting: Physician Assistant

## 2023-04-21 DIAGNOSIS — M25562 Pain in left knee: Secondary | ICD-10-CM

## 2023-04-21 DIAGNOSIS — G8929 Other chronic pain: Secondary | ICD-10-CM | POA: Diagnosis not present

## 2023-04-21 MED ORDER — METHYLPREDNISOLONE ACETATE 40 MG/ML IJ SUSP
40.0000 mg | INTRAMUSCULAR | Status: AC | PRN
  Administered 2023-04-21: 40 mg via INTRA_ARTICULAR

## 2023-04-21 MED ORDER — BUPIVACAINE HCL 0.25 % IJ SOLN
2.0000 mL | INTRAMUSCULAR | Status: AC | PRN
  Administered 2023-04-21: 2 mL via INTRA_ARTICULAR

## 2023-04-21 MED ORDER — LIDOCAINE HCL 1 % IJ SOLN
2.0000 mL | INTRAMUSCULAR | Status: AC | PRN
  Administered 2023-04-21: 2 mL

## 2023-04-21 NOTE — Progress Notes (Signed)
Office Visit Note   Patient: Beth [name]           Date of Birth: 03-27-1956           MRN: [id number] Visit Date: 04/21/2023              Requested by: Sandford Craze, NP [id number] [place],  [place] [address] PCP: Sandford Craze, NP  Chief Complaint  Patient presents with  . Left Knee - Pain      HPI: Beth [name] is a pleasant 67 year old woman with a history of left knee arthritis.  I last injected her left knee in February and she got good relief.  Pain is now returned requesting another injection.  She says her weight is about 250 she would like to get knee replacement and is working on weight loss.  Rates her current pain is moderate  Assessment & Plan: Visit Diagnoses:  1. Chronic pain of left knee     Plan: Successfully injected her left knee.  She has had no new injury.  She may follow-up as needed  Follow-Up Instructions: No follow-ups on file.   Ortho Exam  Patient is alert, oriented, no adenopathy, well-dressed, normal affect, normal respiratory effort. Left knee no effusion no erythema compartments are soft and nontender neurovascular intact review of x-rays does demonstrate tricompartmental arthritis  Imaging: No results found. No images are attached to the encounter.  Labs: Lab Results  Component Value Date   REPTSTATUS 03/25/2020 FINAL 03/24/2020   CULT (A) 03/24/2020    30,000 COLONIES/mL STREPTOCOCCUS AGALACTIAE TESTING AGAINST S. AGALACTIAE NOT ROUTINELY PERFORMED DUE TO PREDICTABILITY OF AMP/PEN/VAN SUSCEPTIBILITY. Performed at [facility], [address] [place], [place] [address]      Lab Results  Component Value Date   ALBUMIN 3.9 02/12/2023   ALBUMIN 4.3 07/16/2022   ALBUMIN 4.4 11/27/2020    No results found for: "MG" No results found for: "VD25OH"  No results found for: "PREALBUMIN"    Latest Ref Rng & Units 03/24/2020   10:54 AM 10/26/2019   11:19 AM  CBC EXTENDED  WBC 4.0 - 10.5 K/uL 8.6   8.1   RBC 3.87 - 5.11 MIL/uL 4.25  4.17   Hemoglobin 12.0 - 15.0 g/dL 82.9  56.2   HCT 13.0 - 46.0 % 40.9  38.8   Platelets 150 - 400 K/uL 311  279.0   NEUT# 1.4 - 7.7 K/uL  5.5   Lymph# 0.7 - 4.0 K/uL  1.9      There is no height or weight on file to calculate BMI.  Orders:  No orders of the defined types were placed in this encounter.  No orders of the defined types were placed in this encounter.    Procedures: Large Joint Inj: L knee on 04/21/2023 10:17 AM Indications: pain and diagnostic evaluation Details: 25 G 1.5 in needle, anteromedial approach  Arthrogram: No  Medications: 40 mg methylPREDNISolone acetate 40 MG/ML; 2 mL lidocaine 1 %; 2 mL bupivacaine 0.25 % Outcome: tolerated well, no immediate complications Procedure, treatment alternatives, risks and benefits explained, specific risks discussed. Consent was given by the patient.    Clinical Data: No additional findings.  ROS:  All other systems negative, except as noted in the HPI. Review of Systems  Objective: Vital Signs: There were no vitals taken for this visit.  Specialty Comments:  No specialty comments available.  PMFS History: Patient Active Problem List   Diagnosis Date Noted  .  Onychomycosis 01/14/2023  . Eczema 01/14/2023  . Hypertriglyceridemia 07/17/2022  . Hyperglycemia 07/17/2022  . Bilateral primary osteoarthritis of knee 01/17/2022  . Chronic pain of left knee 01/15/2022  . Bilateral carpal tunnel syndrome 01/15/2022  . Microscopic hematuria 03/04/2021  . OSA (obstructive sleep apnea) 07/27/2020  . Insomnia 04/25/2020  . Restless leg 04/25/2020  . Depression 04/25/2020  . Arthritis of shoulder 03/28/2020  . Primary osteoarthritis, right shoulder 01/11/2020   Past Medical History:  Diagnosis Date  . Anxiety   . Arthritis   . Depression   . Sleep apnea    does not uses CPAP; uses mouth guard.     Family History  Problem Relation Age of Onset  . Breast cancer Mother 67   . Diabetes Father   . Hydrocephalus Father   . Lupus Maternal Aunt   . Tuberculosis Maternal Grandmother   . Alcohol abuse Maternal Grandfather        suicide  . Diabetes Mellitus II Paternal Grandmother   . Peripheral vascular disease Paternal Grandmother   . Colon cancer Neg Hx   . Colon polyps Neg Hx   . Esophageal cancer Neg Hx   . Stomach cancer Neg Hx   . Rectal cancer Neg Hx     Past Surgical History:  Procedure Laterality Date  . ANKLE SURGERY Right 2008  . BACK SURGERY  2009  . COLONOSCOPY    . REPLACEMENT TOTAL KNEE Right 2012  . TONSILLECTOMY    . TOTAL SHOULDER ARTHROPLASTY Right 03/28/2020   Procedure: RIGHT SHOULDER REPLACEMENT;  Surgeon: Cammy Copa, MD;  Location: [facility] OR;  Service: Orthopedics;  Laterality: Right;   Social History   Occupational History  . Occupation: retired  Tobacco Use  . Smoking status: Former    Types: Cigarettes    Quit date: 07/22/1989    Years since quitting: 33.7  . Smokeless tobacco: Never  Vaping Use  . Vaping Use: Never used  Substance and Sexual Activity  . Alcohol use: Yes    Comment: social, couple times a month  . Drug use: Never  . Sexual activity: Yes    Partners: Male

## 2023-04-28 ENCOUNTER — Other Ambulatory Visit: Payer: Self-pay | Admitting: Family

## 2023-04-28 DIAGNOSIS — B351 Tinea unguium: Secondary | ICD-10-CM

## 2023-05-01 ENCOUNTER — Telehealth: Payer: Self-pay | Admitting: Pulmonary Disease

## 2023-05-01 NOTE — Telephone Encounter (Signed)
Patient is returning phone call. Patient phone number is [phone].

## 2023-05-01 NOTE — Telephone Encounter (Signed)
Called and spoke with pt who is requesting to have an Rx for Ambien 10mg  sent to the pharmacy for her as the Ambien CR 12.5mg  is currently on backorder. Pt wants to make sure she has something at home for her to be able to take as needed for sleep.  Dr. Val Eagle, please advise on this.  [facility] [place].

## 2023-05-01 NOTE — Telephone Encounter (Signed)
Attempted to call pt but unable to reach. Left message to return call.  

## 2023-05-01 NOTE — Telephone Encounter (Signed)
Patient would like Ambien 10.0 mg called into pharmacy.. 12.5 mg [place] on backorder. Pharmacy is [facility] [place] [facility].  Patient out of medication. Patient phone number is [phone].

## 2023-05-06 NOTE — Telephone Encounter (Signed)
Pt calling back because she hasn't had her Ambien in a week and she needs the Ambien 10mg  sent to [facility] [place]

## 2023-05-07 NOTE — Telephone Encounter (Signed)
Patient is calling again to request that an order for her Ambien be sent into her pharmacy, [facility] [place].  She stated that the pharmacy has the 10mg  but they need a new order for that one.  Please advise and call patient to discuss further at [phone]

## 2023-05-07 NOTE — Telephone Encounter (Signed)
AO, can you please advise? Thanks!  

## 2023-05-08 ENCOUNTER — Other Ambulatory Visit: Payer: Self-pay | Admitting: Pulmonary Disease

## 2023-05-08 ENCOUNTER — Encounter: Payer: Self-pay | Admitting: Pulmonary Disease

## 2023-05-08 MED ORDER — ZOLPIDEM TARTRATE 10 MG PO TABS: 10.0000 mg | ORAL_TABLET | Freq: Every evening | ORAL | 1 refills | Status: DC | PRN

## 2023-05-08 NOTE — Progress Notes (Signed)
Ambien order sent 10

## 2023-05-08 NOTE — Telephone Encounter (Signed)
Done

## 2023-05-08 NOTE — Telephone Encounter (Signed)
Patient is calling again to get her Ambien 10mg  approved.  Please advise.

## 2023-05-18 DIAGNOSIS — G4733 Obstructive sleep apnea (adult) (pediatric): Secondary | ICD-10-CM | POA: Diagnosis not present

## 2023-05-21 DIAGNOSIS — G4733 Obstructive sleep apnea (adult) (pediatric): Secondary | ICD-10-CM | POA: Diagnosis not present

## 2023-06-20 DIAGNOSIS — G4733 Obstructive sleep apnea (adult) (pediatric): Secondary | ICD-10-CM | POA: Diagnosis not present

## 2023-07-09 ENCOUNTER — Encounter (INDEPENDENT_AMBULATORY_CARE_PROVIDER_SITE_OTHER): Payer: Self-pay

## 2023-07-10 DIAGNOSIS — G4733 Obstructive sleep apnea (adult) (pediatric): Secondary | ICD-10-CM | POA: Diagnosis not present

## 2023-07-21 DIAGNOSIS — G4733 Obstructive sleep apnea (adult) (pediatric): Secondary | ICD-10-CM | POA: Diagnosis not present

## 2023-07-22 ENCOUNTER — Other Ambulatory Visit: Payer: Self-pay | Admitting: Oncology

## 2023-07-22 ENCOUNTER — Other Ambulatory Visit: Payer: Self-pay | Admitting: Family

## 2023-07-22 ENCOUNTER — Ambulatory Visit: Payer: Medicare HMO | Admitting: Family

## 2023-07-22 VITALS — BP 125/57 | HR 73 | Temp 97.9°F | Resp 16 | Ht 62.25 in | Wt 250.0 lb

## 2023-07-22 DIAGNOSIS — G4700 Insomnia, unspecified: Secondary | ICD-10-CM | POA: Diagnosis not present

## 2023-07-22 DIAGNOSIS — E785 Hyperlipidemia, unspecified: Secondary | ICD-10-CM

## 2023-07-22 DIAGNOSIS — G2581 Restless legs syndrome: Secondary | ICD-10-CM

## 2023-07-22 DIAGNOSIS — Z6841 Body Mass Index (BMI) 40.0 and over, adult: Secondary | ICD-10-CM

## 2023-07-22 DIAGNOSIS — Z006 Encounter for examination for normal comparison and control in clinical research program: Secondary | ICD-10-CM

## 2023-07-22 DIAGNOSIS — L309 Dermatitis, unspecified: Secondary | ICD-10-CM

## 2023-07-22 DIAGNOSIS — Z0000 Encounter for general adult medical examination without abnormal findings: Secondary | ICD-10-CM | POA: Insufficient documentation

## 2023-07-22 DIAGNOSIS — R739 Hyperglycemia, unspecified: Secondary | ICD-10-CM

## 2023-07-22 DIAGNOSIS — Z1231 Encounter for screening mammogram for malignant neoplasm of breast: Secondary | ICD-10-CM

## 2023-07-22 DIAGNOSIS — G4733 Obstructive sleep apnea (adult) (pediatric): Secondary | ICD-10-CM

## 2023-07-22 LAB — COMPREHENSIVE METABOLIC PANEL
ALT: 19 U/L (ref 0–35)
AST: 18 U/L (ref 0–37)
Albumin: 4 g/dL (ref 3.5–5.2)
Alkaline Phosphatase: 82 U/L (ref 39–117)
BUN: 24 mg/dL — ABNORMAL HIGH (ref 6–23)
CO2: 28 mEq/L (ref 19–32)
Calcium: 8.7 mg/dL (ref 8.4–10.5)
Chloride: 104 mEq/L (ref 96–112)
Creatinine, Ser: 1.06 mg/dL (ref 0.40–1.20)
GFR: 54.9 mL/min — ABNORMAL LOW (ref >60.00)
Glucose, Bld: 102 mg/dL — ABNORMAL HIGH (ref 70–99)
Potassium: 4.7 mEq/L (ref 3.5–5.1)
Sodium: 141 mEq/L (ref 135–145)
Total Bilirubin: 0.4 mg/dL (ref 0.2–1.2)
Total Protein: 5.7 g/dL — ABNORMAL LOW (ref 6.0–8.3)

## 2023-07-22 LAB — LIPID PANEL
Cholesterol: 203 mg/dL — ABNORMAL HIGH (ref 0–200)
HDL: 51.9 mg/dL (ref >39.00)
LDL Cholesterol: 122 mg/dL — ABNORMAL HIGH (ref 0–99)
NonHDL: 151.45
Total CHOL/HDL Ratio: 4
Triglycerides: 146 mg/dL (ref 0.0–149.0)
VLDL: 29.2 mg/dL (ref 0.0–40.0)

## 2023-07-22 LAB — HEMOGLOBIN A1C: Hgb A1c MFr Bld: 5.6 % (ref 4.6–6.5)

## 2023-07-22 MED ORDER — TETANUS-DIPHTHERIA TOXOIDS TD 5-2 LFU IM INJ: 0.5000 mL | INJECTION | Freq: Once | INTRAMUSCULAR | 0 refills | Status: AC

## 2023-07-22 NOTE — Assessment & Plan Note (Signed)
Discussed healthy diet/exercise and weight loss. Due for Tetanus- rx sent to her pharmacy. Recommended covid/flu boosters at her pharmacy this fall. Colo up to date.

## 2023-07-22 NOTE — Assessment & Plan Note (Addendum)
Continue with Ambien as ordered per sleep specialist.

## 2023-07-22 NOTE — Assessment & Plan Note (Signed)
She is requesting referral to bariatrics for consideration of Gastric Sleeve surgery. Referral placed.

## 2023-07-22 NOTE — Assessment & Plan Note (Signed)
No issues noted with her restless leg.

## 2023-07-22 NOTE — Assessment & Plan Note (Signed)
Continue with kenalog cream as ordered

## 2023-07-22 NOTE — Progress Notes (Signed)
Subjective:     Patient ID: Beth [name], female    DOB: 1955/12/15, 67 y.o.   MRN: [id number]  No chief complaint on file.   HPI  Discussed the use of AI scribe software for clinical note transcription with the patient, who gave verbal consent to proceed.   67 year old female presents to the clinic today for her yearly physical. She has a past medical history of OSA, arthritis, eczema, insomnia, hypertriglyceridemia, and hyperglycemia. Patient states that she is doing well with no issues of concern. Wants to discuss an dermatology referral for a place on her right shoulder that is concerning her. She states sometimes the area is soft and other times its hard. She is up to date with her eye exam and  mammogram. She is needing her tdap vaccine and updated her with that. Wants to discuss weight loss as well this office visit.      Health Maintenance Due  Topic Date Due   DTaP/Tdap/Td (2 - Td or Tdap) 09/16/2021   COVID-19 Vaccine (5 - 2023-24 season) 08/09/2022   Medicare Annual Wellness (AWV)  07/17/2023   INFLUENZA VACCINE  07/10/2023    Past Medical History:  Diagnosis Date   Anxiety    Arthritis    Depression    Sleep apnea    does not uses CPAP; uses mouth guard.     Past Surgical History:  Procedure Laterality Date   ANKLE SURGERY Right 2008   BACK SURGERY  2009   COLONOSCOPY     REPLACEMENT TOTAL KNEE Right 2012   TONSILLECTOMY     TOTAL SHOULDER ARTHROPLASTY Right 03/28/2020   Procedure: RIGHT SHOULDER REPLACEMENT;  Surgeon: Cammy Copa, MD;  Location: [facility] OR;  Service: Orthopedics;  Laterality: Right;    Family History  Problem Relation Age of Onset   Breast cancer Mother 37   Diabetes Father    Hydrocephalus Father    Lupus Maternal Aunt    Tuberculosis Maternal Grandmother    Alcohol abuse Maternal Grandfather        suicide   Diabetes Mellitus II Paternal Grandmother    Peripheral vascular disease Paternal Grandmother    Colon cancer Neg  Hx    Colon polyps Neg Hx    Esophageal cancer Neg Hx    Stomach cancer Neg Hx    Rectal cancer Neg Hx     Social History   Socioeconomic History   Marital status: Married    Spouse name: Ron   Number of children: 2   Years of education: Not on file   Highest education level: Not on file  Occupational History   Occupation: retired  Tobacco Use   Smoking status: Former    Current packs/day: 0.00    Types: Cigarettes    Quit date: 07/22/1989    Years since quitting: 34.0   Smokeless tobacco: Never  Vaping Use   Vaping status: Never Used  Substance and Sexual Activity   Alcohol use: Yes    Comment: social, couple times a month   Drug use: Never   Sexual activity: Yes    Partners: Male  Other Topics Concern   Not on file  Social History Narrative   Retired Music therapist   Divorced (from [place] originally)   Remarried 2015 (husband is a IT trainer)   Has a dog   2 children (son and daughter) both married, daughter lives in [place], son in [place]. No grandchildren   Enjoys baking, sewing, reading  Social Determinants of Health   Financial Resource Strain: Low Risk  (07/23/2019)   Overall Financial Resource Strain (CARDIA)    Difficulty of Paying Living Expenses: Not hard at all  Food Insecurity: No Food Insecurity (04/05/2021)   [facility] from [facility]   Hunger Vital Sign    Worried About Running Out of Food in the Last Year: Never true    Ran Out of Food in the Last Year: Never true  Transportation Needs: Unknown (07/23/2019)   PRAPARE - Administrator, Civil Service (Medical): Not on file    Lack of Transportation (Non-Medical): No  Physical Activity: Insufficiently Active (07/23/2019)   Exercise Vital Sign    Days of Exercise per Week: 1 day    Minutes of Exercise per Session: 30 min  Stress: Not on file  Social Connections: Unknown (04/19/2022)   Received from [facility]   Social Network    Social Network: Not on file  Intimate Partner Violence:  Unknown (03/11/2022)   Received from [facility]   HITS    Physically Hurt: Not on file    Insult or Talk Down To: Not on file    Threaten Physical Harm: Not on file    Scream or Curse: Not on file    Outpatient Medications Prior to Visit  Medication Sig Dispense Refill   b complex vitamins tablet Take 1 tablet by mouth daily.     CALCIUM PO Take 1 tablet by mouth daily.     celecoxib (CELEBREX) 200 MG capsule Take 1 capsule (200 mg total) by mouth daily. 90 capsule 1   Cholecalciferol (VITAMIN D) 50 MCG (2000 UT) CAPS Take 2,000 Units by mouth daily.     ferrous sulfate 325 (65 FE) MG tablet Take 325 mg by mouth daily with breakfast.     Multiple Minerals (CALCIUM-MAGNESIUM-ZINC) TABS Take 3 tablets by mouth daily.     Omega-3 Fatty Acids (FISH OIL) 1000 MG CAPS Take 3 capsules (3,000 mg total) by mouth in the morning and at bedtime.  0   sertraline (ZOLOFT) 100 MG tablet Take 1 tablet (100 mg total) by mouth daily. 90 tablet 1   triamcinolone cream (KENALOG) 0.1 % APPLY 1 APPLICATION TOPICALLY 2 (TWO) TIMES DAILY. TO EAR CANALS 30 g 0   zolpidem (AMBIEN) 10 MG tablet Take 1 tablet (10 mg total) by mouth at bedtime as needed for sleep. 30 tablet 1   No facility-administered medications prior to visit.    No Known Allergies  Review of Systems  Constitutional:  Negative for chills, fever and weight loss.  HENT:  Negative for ear discharge, ear pain, sinus pain and sore throat.   Eyes:  Negative for blurred vision, double vision and pain.  Respiratory:  Negative for sputum production and shortness of breath.   Cardiovascular:  Negative for chest pain and palpitations.  Gastrointestinal:  Negative for abdominal pain, heartburn, nausea and vomiting.  Genitourinary:  Negative for urgency.  Musculoskeletal:  Negative for back pain and neck pain.  Skin:  Negative for itching and rash.  Neurological:  Negative for dizziness, sensory change, focal weakness and headaches.   Psychiatric/Behavioral:  Negative for depression.        Objective:    Physical Exam Constitutional:      Appearance: Normal appearance.  HENT:     Head: Normocephalic.     Right Ear: Tympanic membrane, ear canal and external ear normal.     Left Ear: Tympanic membrane, ear canal  and external ear normal.     Nose: Nose normal.     Mouth/Throat:     Mouth: Mucous membranes are moist.  Eyes:     Pupils: Pupils are equal, round, and reactive to light.  Cardiovascular:     Rate and Rhythm: Normal rate and regular rhythm.     Pulses: Normal pulses.     Heart sounds: Normal heart sounds.  Pulmonary:     Effort: Pulmonary effort is normal.     Breath sounds: Normal breath sounds.  Abdominal:     General: Abdomen is flat. Bowel sounds are normal.  Musculoskeletal:        General: Normal range of motion.     Cervical back: Normal range of motion.  Skin:    General: Skin is warm and dry.     Capillary Refill: Capillary refill takes 2 to 3 seconds.  Neurological:     General: No focal deficit present.     Mental Status: She is alert and oriented to person, place, and time. Mental status is at baseline.  Psychiatric:        Mood and Affect: Mood normal.        Behavior: Behavior normal.        Thought Content: Thought content normal.        Judgment: Judgment normal.      BP (!) 125/57 (BP Location: Right Arm, Patient Position: Sitting, Cuff Size: Large)   Pulse 73   Temp 97.9 F (36.6 C) (Oral)   Resp 16   Ht 5' 2.25" (1.581 m)   Wt 250 lb (113.4 kg)   SpO2 97%   BMI 45.36 kg/m  Wt Readings from Last 3 Encounters:  07/22/23 250 lb (113.4 kg)  04/15/23 252 lb (114.3 kg)  01/14/23 244 lb (110.7 kg)       Assessment & Plan:   Problem List Items Addressed This Visit       Unprioritized   Restless leg    No issues noted with her restless leg.       Preventative health care    Discussed healthy diet/exercise and weight loss. Due for Tetanus- rx sent to her  pharmacy. Recommended covid/flu boosters at her pharmacy this fall. Colo up to date.       OSA (obstructive sleep apnea)    No issues noted at this time. Continues CPAP.       Morbid obesity (HCC) - Primary    She is requesting referral to bariatrics for consideration of Gastric Sleeve surgery. Referral placed.       Relevant Orders   Amb Referral to Bariatric Surgery   Insomnia    Continue with Ambien as ordered per sleep specialist.       Hyperglycemia   Relevant Orders   Comp Met (CMET)   HgB A1c   Eczema    Continue with kenalog cream as ordered      Other Visit Diagnoses     Routine general medical examination at a health care facility       Relevant Orders   Ambulatory referral to Dermatology   Hyperlipidemia, unspecified hyperlipidemia type       Relevant Orders   Lipid panel   Breast cancer screening by mammogram       Relevant Orders   MM 3D SCREENING MAMMOGRAM BILATERAL BREAST       I am having [name] start on tetanus & diphtheria toxoids (adult). I am also having her maintain her  Calcium-Magnesium-Zinc, ferrous sulfate, b complex vitamins, Vitamin D, CALCIUM PO, Fish Oil, celecoxib, sertraline, triamcinolone cream, and zolpidem.  Meds ordered this encounter  Medications   tetanus & diphtheria toxoids, adult, (TENIVAC) 5-2 LFU injection    Sig: Inject 0.5 mLs into the muscle once for 1 dose.    Dispense:  0.5 mL    Refill:  0    Order Specific Question:   Supervising Provider    Answer:   Danise Edge A [[id number]]

## 2023-07-22 NOTE — Assessment & Plan Note (Addendum)
No issues noted at this time. Continues CPAP.

## 2023-07-23 ENCOUNTER — Telehealth: Payer: Self-pay | Admitting: Family

## 2023-07-23 DIAGNOSIS — N1831 Chronic kidney disease, stage 3a: Secondary | ICD-10-CM

## 2023-07-23 DIAGNOSIS — R7989 Other specified abnormal findings of blood chemistry: Secondary | ICD-10-CM

## 2023-07-23 NOTE — Telephone Encounter (Signed)
Patient notified of results and provider's recommendations. She was scheduled to be here tomorrow for urine check

## 2023-07-23 NOTE — Telephone Encounter (Signed)
Cholesterol is very mildly elevated.    A1C is in the normal range.   Kidney function is slightly reduced but stable since last year.  Stay well hydrated and avoid NSAIDS.   Her protein level in her blood is a bit low.  I would like to check a urine to make sure that she is not losing protein through her urine.  She should focus an a diet high in lean proteins.

## 2023-07-24 ENCOUNTER — Other Ambulatory Visit (INDEPENDENT_AMBULATORY_CARE_PROVIDER_SITE_OTHER): Payer: Medicare HMO

## 2023-07-24 DIAGNOSIS — R7989 Other specified abnormal findings of blood chemistry: Secondary | ICD-10-CM

## 2023-07-24 LAB — URINALYSIS, ROUTINE W REFLEX MICROSCOPIC
Bilirubin Urine: NEGATIVE
Hgb urine dipstick: NEGATIVE
Leukocytes,Ua: NEGATIVE
Nitrite: NEGATIVE
Specific Gravity, Urine: 1.025 (ref 1.000–1.030)
Total Protein, Urine: NEGATIVE
Urine Glucose: NEGATIVE
Urobilinogen, UA: 0.2 (ref 0.0–1.0)
pH: 6 (ref 5.0–8.0)

## 2023-07-25 ENCOUNTER — Ambulatory Visit: Payer: Medicare HMO | Admitting: Physician Assistant

## 2023-07-25 ENCOUNTER — Encounter: Payer: Self-pay | Admitting: Physician Assistant

## 2023-07-25 DIAGNOSIS — M1712 Unilateral primary osteoarthritis, left knee: Secondary | ICD-10-CM

## 2023-07-25 MED ORDER — LIDOCAINE HCL 1 % IJ SOLN
2.0000 mL | INTRAMUSCULAR | Status: AC | PRN
Start: 2023-07-25 — End: 2023-07-25
  Administered 2023-07-25: 2 mL

## 2023-07-25 MED ORDER — METHYLPREDNISOLONE ACETATE 40 MG/ML IJ SUSP
80.0000 mg | INTRAMUSCULAR | Status: AC | PRN
Start: 2023-07-25 — End: 2023-07-25
  Administered 2023-07-25: 80 mg via INTRA_ARTICULAR

## 2023-07-25 MED ORDER — BUPIVACAINE HCL 0.25 % IJ SOLN
2.0000 mL | INTRAMUSCULAR | Status: AC | PRN
Start: 2023-07-25 — End: 2023-07-25
  Administered 2023-07-25: 2 mL via INTRA_ARTICULAR

## 2023-07-25 NOTE — Progress Notes (Signed)
Office Visit Note   Patient: Beth [name]           Date of Birth: November 01, 1956           MRN: [id number] Visit Date: 07/25/2023              Requested by: Sandford Craze, NP [address] [place],  [place] [address] PCP: Sandford Craze, NP  Chief Complaint  Patient presents with  . Left Knee - Pain      HPI: Beth [name] is a pleasant 67 year old woman with a history of left knee osteoarthritis.  She gets good relief from steroid injections.  It has been 3 months since her last injection.  No new injury rates her pain is moderate but gets excellent relief from the injection  Assessment & Plan: Visit Diagnoses: Osteoarthritis left knee  Plan: Left knee was injected today tolerated it well we will follow-up in 3 months  Follow-Up Instructions: 3 months  Ortho Exam  Patient is alert, oriented, no adenopathy, well-dressed, normal affect, normal respiratory effort. Left knee: No effusion erythema compartments are soft and nontender negative Denna Haggard' sign she is neurovascular intact and has good strength  Imaging: No results found. No images are attached to the encounter.  Labs: Lab Results  Component Value Date   HGBA1C 5.6 07/22/2023   REPTSTATUS 03/25/2020 FINAL 03/24/2020   CULT (A) 03/24/2020    30,000 COLONIES/mL STREPTOCOCCUS AGALACTIAE TESTING AGAINST S. AGALACTIAE NOT ROUTINELY PERFORMED DUE TO PREDICTABILITY OF AMP/PEN/VAN SUSCEPTIBILITY. Performed at [facility], [address] [place], [place] [address]      Lab Results  Component Value Date   ALBUMIN 4.0 07/22/2023   ALBUMIN 3.9 02/12/2023   ALBUMIN 4.3 07/16/2022    No results found for: "MG" No results found for: "VD25OH"  No results found for: "PREALBUMIN"    Latest Ref Rng & Units 03/24/2020   10:54 AM 10/26/2019   11:19 AM  CBC EXTENDED  WBC 4.0 - 10.5 K/uL 8.6  8.1   RBC 3.87 - 5.11 MIL/uL 4.25  4.17   Hemoglobin 12.0 - 15.0 g/dL 82.9  56.2   HCT 13.0 - 46.0 %  40.9  38.8   Platelets 150 - 400 K/uL 311  279.0   NEUT# 1.4 - 7.7 K/uL  5.5   Lymph# 0.7 - 4.0 K/uL  1.9      There is no height or weight on file to calculate BMI.  Orders:  No orders of the defined types were placed in this encounter.  No orders of the defined types were placed in this encounter.    Procedures: Large Joint Inj: L knee on 07/25/2023 2:21 PM Indications: pain and diagnostic evaluation Details: 25 G 1.5 in needle, anteromedial approach  Arthrogram: No  Medications: 80 mg methylPREDNISolone acetate 40 MG/ML; 2 mL lidocaine 1 %; 2 mL bupivacaine 0.25 % Outcome: tolerated well, no immediate complications Procedure, treatment alternatives, risks and benefits explained, specific risks discussed. Consent was given by the patient.    Clinical Data: No additional findings.  ROS:  All other systems negative, except as noted in the HPI. Review of Systems  Objective: Vital Signs: There were no vitals taken for this visit.  Specialty Comments:  No specialty comments available.  PMFS History: Patient Active Problem List   Diagnosis Date Noted  . Morbid obesity (HCC) 07/22/2023  . Preventative health care 07/22/2023  . Onychomycosis 01/14/2023  . Eczema 01/14/2023  . Hypertriglyceridemia 07/17/2022  .  Hyperglycemia 07/17/2022  . Bilateral primary osteoarthritis of knee 01/17/2022  . Chronic pain of left knee 01/15/2022  . Bilateral carpal tunnel syndrome 01/15/2022  . Microscopic hematuria 03/04/2021  . OSA (obstructive sleep apnea) 07/27/2020  . Insomnia 04/25/2020  . Restless leg 04/25/2020  . Depression 04/25/2020  . Arthritis of shoulder 03/28/2020  . Primary osteoarthritis, right shoulder 01/11/2020   Past Medical History:  Diagnosis Date  . Anxiety   . Arthritis   . Depression   . Sleep apnea    does not uses CPAP; uses mouth guard.     Family History  Problem Relation Age of Onset  . Breast cancer Mother 85  . Diabetes Father   .  Hydrocephalus Father   . Lupus Maternal Aunt   . Tuberculosis Maternal Grandmother   . Alcohol abuse Maternal Grandfather        suicide  . Diabetes Mellitus II Paternal Grandmother   . Peripheral vascular disease Paternal Grandmother   . Colon cancer Neg Hx   . Colon polyps Neg Hx   . Esophageal cancer Neg Hx   . Stomach cancer Neg Hx   . Rectal cancer Neg Hx     Past Surgical History:  Procedure Laterality Date  . ANKLE SURGERY Right 2008  . BACK SURGERY  2009  . COLONOSCOPY    . REPLACEMENT TOTAL KNEE Right 2012  . TONSILLECTOMY    . TOTAL SHOULDER ARTHROPLASTY Right 03/28/2020   Procedure: RIGHT SHOULDER REPLACEMENT;  Surgeon: Cammy Copa, MD;  Location: [facility] OR;  Service: Orthopedics;  Laterality: Right;   Social History   Occupational History  . Occupation: retired  Tobacco Use  . Smoking status: Former    Current packs/day: 0.00    Types: Cigarettes    Quit date: 07/22/1989    Years since quitting: 34.0  . Smokeless tobacco: Never  Vaping Use  . Vaping status: Never Used  Substance and Sexual Activity  . Alcohol use: Yes    Comment: social, couple times a month  . Drug use: Never  . Sexual activity: Yes    Partners: Male

## 2023-07-28 ENCOUNTER — Encounter: Payer: Self-pay | Admitting: Family

## 2023-07-30 ENCOUNTER — Other Ambulatory Visit: Payer: Self-pay | Admitting: Pulmonary Disease

## 2023-07-30 NOTE — Telephone Encounter (Signed)
Pt wants to know if she can split the Ambien into 2

## 2023-08-03 ENCOUNTER — Other Ambulatory Visit: Payer: Self-pay | Admitting: Family

## 2023-08-03 DIAGNOSIS — B351 Tinea unguium: Secondary | ICD-10-CM

## 2023-08-04 ENCOUNTER — Other Ambulatory Visit: Payer: Self-pay | Admitting: Pulmonary Disease

## 2023-08-04 NOTE — Telephone Encounter (Signed)
PT calling again stating the 12.5 Ambien is too expensive so she needs 10 mg and 5 mg (she breaks the 5 mg I half.)  Pharm is [facility] in [place].  [phone]

## 2023-08-05 ENCOUNTER — Ambulatory Visit (INDEPENDENT_AMBULATORY_CARE_PROVIDER_SITE_OTHER): Payer: Medicare HMO | Admitting: *Deleted

## 2023-08-05 VITALS — BP 135/70

## 2023-08-05 DIAGNOSIS — D225 Melanocytic nevi of trunk: Secondary | ICD-10-CM | POA: Diagnosis not present

## 2023-08-05 DIAGNOSIS — D2271 Melanocytic nevi of right lower limb, including hip: Secondary | ICD-10-CM | POA: Diagnosis not present

## 2023-08-05 DIAGNOSIS — Z0000 Encounter for general adult medical examination without abnormal findings: Secondary | ICD-10-CM

## 2023-08-05 DIAGNOSIS — L720 Epidermal cyst: Secondary | ICD-10-CM | POA: Diagnosis not present

## 2023-08-05 DIAGNOSIS — L738 Other specified follicular disorders: Secondary | ICD-10-CM | POA: Diagnosis not present

## 2023-08-05 DIAGNOSIS — L578 Other skin changes due to chronic exposure to nonionizing radiation: Secondary | ICD-10-CM | POA: Diagnosis not present

## 2023-08-05 DIAGNOSIS — L821 Other seborrheic keratosis: Secondary | ICD-10-CM | POA: Diagnosis not present

## 2023-08-05 DIAGNOSIS — D2272 Melanocytic nevi of left lower limb, including hip: Secondary | ICD-10-CM | POA: Diagnosis not present

## 2023-08-05 DIAGNOSIS — D2261 Melanocytic nevi of right upper limb, including shoulder: Secondary | ICD-10-CM | POA: Diagnosis not present

## 2023-08-05 NOTE — Progress Notes (Signed)
Subjective:   Beth [name] is a 67 y.o. female who presents for an Initial Medicare Annual Wellness Visit.  Visit Complete: Virtual  I connected with  Beth [name] on 08/05/23 by a audio enabled telemedicine application and verified that I am speaking with the correct person using two identifiers.  Patient Location: Home  Provider Location: Office/Clinic  I discussed the limitations of evaluation and management by telemedicine. The patient expressed understanding and agreed to proceed.  Patient Medicare AWV questionnaire was completed by the patient on 08/04/2023; I have confirmed that all information answered by patient is correct and no changes since this date.  Review of Systems    Cardiac Risk Factors include: advanced age (>11men, >63 women);dyslipidemia;obesity (BMI >30kg/m2)     Objective:   Patient reported vitals. Today's Vitals   08/05/23 1436  BP: 135/70   There is no height or weight on file to calculate BMI.     08/05/2023    2:30 PM 04/12/2020    3:22 PM 03/24/2020   10:31 AM  Advanced Directives  Does Patient Have a Medical Advance Directive? No Yes Yes  Type of Special educational needs teacher of [place];Living will Healthcare Power of [place];Living will  Does patient want to make changes to medical advance directive?   No - Patient declined  Copy of Healthcare Power of Attorney in Chart?  No - copy requested No - copy requested  Would patient like information on creating a medical advance directive? No - Patient declined      Current Medications (verified) Outpatient Encounter Medications as of 08/05/2023  Medication Sig   b complex vitamins tablet Take 1 tablet by mouth daily.   CALCIUM PO Take 1 tablet by mouth daily.   celecoxib (CELEBREX) 200 MG capsule TAKE 1 CAPSULE BY MOUTH EVERY DAY   Cholecalciferol (VITAMIN D) 50 MCG (2000 UT) CAPS Take 2,000 Units by mouth daily.   ferrous sulfate 325 (65 FE) MG tablet Take 325 mg by mouth daily  with breakfast.   Multiple Minerals (CALCIUM-MAGNESIUM-ZINC) TABS Take 3 tablets by mouth daily.   Omega-3 Fatty Acids (FISH OIL) 1000 MG CAPS Take 3 capsules (3,000 mg total) by mouth in the morning and at bedtime.   sertraline (ZOLOFT) 100 MG tablet TAKE 1 TABLET BY MOUTH EVERY DAY   triamcinolone cream (KENALOG) 0.1 % APPLY 1 APPLICATION TOPICALLY 2 (TWO) TIMES DAILY. TO EAR CANALS   zolpidem (AMBIEN) 10 MG tablet Take 1 tablet (10 mg total) by mouth at bedtime as needed for sleep.   No facility-administered encounter medications on file as of 08/05/2023.    Allergies (verified) Patient has no known allergies.   History: Past Medical History:  Diagnosis Date   Anxiety    Arthritis    Depression    Sleep apnea    does not uses CPAP; uses mouth guard.    Past Surgical History:  Procedure Laterality Date   ANKLE SURGERY Right 2008   BACK SURGERY  2009   COLONOSCOPY     COSMETIC SURGERY     JOINT REPLACEMENT  '13, '21   r knee, r shoulder   REPLACEMENT TOTAL KNEE Right 2012   SPINE SURGERY  '09   back   TONSILLECTOMY     TOTAL SHOULDER ARTHROPLASTY Right 03/28/2020   Procedure: RIGHT SHOULDER REPLACEMENT;  Surgeon: Cammy Copa, MD;  [facility] OR;  Service: Orthopedics;  Laterality: Right;   Family History  Problem Relation Age of Onset   Breast  cancer Mother 48   Cancer Mother    Depression Mother    Diabetes Father    Hydrocephalus Father    Lupus Maternal Aunt    Tuberculosis Maternal Grandmother    Alcohol abuse Maternal Grandfather        suicide   Diabetes Mellitus II Paternal Grandmother    Peripheral vascular disease Paternal Grandmother    Colon cancer Neg Hx    Colon polyps Neg Hx    Esophageal cancer Neg Hx    Stomach cancer Neg Hx    Rectal cancer Neg Hx    Social History   Socioeconomic History   Marital status: Married    Spouse name: Ron   Number of children: 2   Years of education: Not on file   Highest education level: Not on  file  Occupational History   Occupation: retired  Tobacco Use   Smoking status: Former    Current packs/day: 0.00    Types: Cigarettes    Quit date: 07/22/1989    Years since quitting: 34.0   Smokeless tobacco: Never  Vaping Use   Vaping status: Never Used  Substance and Sexual Activity   Alcohol use: Yes    Comment: social, couple times a month   Drug use: Never   Sexual activity: Yes    Partners: Male  Other Topics Concern   Not on file  Social History Narrative   Retired Music therapist   Divorced (from [place] originally)   Remarried 2015 (husband is a IT trainer)   Has a dog   2 children (son and daughter) both married, daughter lives in [place], son in [place]. No grandchildren   Enjoys baking, sewing, reading   Social Determinants of Health   Financial Resource Strain: Low Risk  (08/04/2023)   Overall Financial Resource Strain (CARDIA)    Difficulty of Paying Living Expenses: Not hard at all  Food Insecurity: No Food Insecurity (08/04/2023)   Hunger Vital Sign    Worried About Running Out of Food in the Last Year: Never true    Ran Out of Food in the Last Year: Never true  Transportation Needs: No Transportation Needs (08/04/2023)   PRAPARE - Administrator, Civil Service (Medical): No    Lack of Transportation (Non-Medical): No  Physical Activity: Patient Declined (08/04/2023)   Exercise Vital Sign    Days of Exercise per Week: Patient declined    Minutes of Exercise per Session: Patient declined  Stress: No Stress Concern Present (08/04/2023)   [employer] of Occupational Health - Occupational Stress Questionnaire    Feeling of Stress : Not at all  Social Connections: Unknown (08/04/2023)   Social Connection and Isolation Panel [NHANES]    Frequency of Communication with Friends and Family: More than three times a week    Frequency of Social Gatherings with Friends and Family: Once a week    Attends Religious Services: Not on Marketing executive or  Organizations: Yes    Attends Banker Meetings: Patient declined    Marital Status: Married    Tobacco Counseling Counseling given: Not Answered   Clinical Intake:  Pre-visit preparation completed: Yes  Pain : No/denies pain     Diabetes: No  How often do you need to have someone help you when you read instructions, pamphlets, or other written materials from your doctor or pharmacy?: 1 - Never  Interpreter Needed?: No  Information entered by :: Dixie Dials, CMA  Activities of Daily Living    08/04/2023    4:20 PM  In your present state of health, do you have any difficulty performing the following activities:  Hearing? 0  Vision? 0  Difficulty concentrating or making decisions? 0  Walking or climbing stairs? 0  Dressing or bathing? 0  Doing errands, shopping? 0  Preparing Food and eating ? N  Using the Toilet? N  In the past six months, have you accidently leaked urine? N  Do you have problems with loss of bowel control? N  Managing your Medications? N  Managing your Finances? N  Housekeeping or managing your Housekeeping? N    Patient Care Team: Sandford Craze, NP as PCP - General (Internal Medicine)  Indicate any recent Medical Services you may have received from other than [facility] providers in the past year (date may be approximate).     Assessment:   This is a routine wellness examination for Shronda.  Hearing/Vision screen No results found.  Dietary issues and exercise activities discussed:     Goals Addressed   None    Depression Screen    08/05/2023    2:27 PM 07/22/2023   10:12 AM 04/15/2023    9:50 AM 01/14/2023   10:12 AM 07/16/2022   11:27 AM 06/01/2021    1:05 PM 07/26/2020   11:26 AM  PHQ 2/9 Scores  PHQ - 2 Score 0 1 0 2 1 1  0  PHQ- 9 Score   0 5 11  9     Fall Risk    08/04/2023    4:20 PM 07/22/2023   10:12 AM 04/15/2023    9:50 AM 07/16/2022    9:57 AM 06/01/2021    1:04 PM  Fall Risk   Falls in the past year?  0 0 0 0 0  Number falls in past yr: 0 0 0 0 0  Injury with Fall? 0 0 0 0 0  Risk for fall due to : No Fall Risks No Fall Risks No Fall Risks    Follow up Falls evaluation completed Falls evaluation completed Falls evaluation completed      MEDICARE RISK AT HOME: Medicare Risk at Home Any stairs in or around the home?: (P) Yes If so, are there any without handrails?: (P) No Home free of loose throw rugs in walkways, pet beds, electrical cords, etc?: (P) Yes Adequate lighting in your home to reduce risk of falls?: (P) Yes Life alert?: (P) No Use of a cane, walker or w/c?: (P) No Grab bars in the bathroom?: (P) No Shower chair or bench in shower?: (P) Yes Elevated toilet seat or a handicapped toilet?: (P) Yes  TIMED UP AND GO:  Was the test performed? No    Cognitive Function:        08/05/2023    2:27 PM  6CIT Screen  What Year? 0 points  What month? 0 points  What time? 0 points  Count back from 20 0 points  Months in reverse 0 points  Repeat phrase 0 points  Total Score 0 points    Immunizations Immunization History  Administered Date(s) Administered   Influenza,inj,Quad PF,6+ Mos 09/13/2017, 09/06/2019   Influenza-Unspecified 09/21/2013, 09/28/2014, 10/10/2015, 10/16/2018, 09/01/2019, 08/23/2020, 08/23/2021, 09/01/2022   Moderna Sars-Covid-2 Vaccination 05/05/2020, 06/02/2020, 01/09/2021   PNEUMOCOCCAL CONJUGATE-20 07/16/2022   Pfizer Covid-19 Vaccine Bivalent Booster 21yrs & up 01/15/2022   Td (Adult),5 Lf Tetanus Toxid, Preservative Free 07/22/2023   Tdap 09/17/2011   Zoster Recombinant(Shingrix) 09/13/2017, 10/26/2019  TDAP status: Up to date  Flu Vaccine status: Due, Education has been provided regarding the importance of this vaccine. Advised may receive this vaccine at local pharmacy or Health Dept. Aware to provide a copy of the vaccination record if obtained from local pharmacy or Health Dept. Verbalized acceptance and understanding.  Pneumococcal  vaccine status: Up to date  Covid-19 vaccine status: Information provided on how to obtain vaccines.   Qualifies for Shingles Vaccine? Yes   Zostavax completed Yes   Shingrix Completed?: Yes  Screening Tests Health Maintenance  Topic Date Due   COVID-19 Vaccine (5 - 2023-24 season) 08/09/2022   INFLUENZA VACCINE  07/10/2023   Medicare Annual Wellness (AWV)  08/04/2024   MAMMOGRAM  08/06/2024   Colonoscopy  08/15/2032   DTaP/Tdap/Td (3 - Td or Tdap) 07/21/2033   Pneumonia Vaccine 47+ Years old  Completed   DEXA SCAN  Completed   Zoster Vaccines- Shingrix  Completed   HPV VACCINES  Aged Out   Hepatitis C Screening  Discontinued    Health Maintenance  Health Maintenance Due  Topic Date Due   COVID-19 Vaccine (5 - 2023-24 season) 08/09/2022   INFLUENZA VACCINE  07/10/2023    Colorectal cancer screening: Type of screening: Colonoscopy. Completed 08/15/22. Repeat every 10 years  Mammogram status: Completed 08/06/22. Repeat every year. Next mammogram scheduled for 08/12/23.   Bone Density status: Completed 08/06/2022. Results reflect: Bone density results: NORMAL. Repeat every 2 years.  Lung Cancer Screening: (Low Dose CT Chest recommended if Age 67-80 years, 20 pack-year currently smoking OR have quit w/in 15years.) does not qualify.   Lung Cancer Screening Referral:   Additional Screening:  Hepatitis C Screening: does qualify; Completed N/A  Vision Screening: Recommended annual ophthalmology exams for early detection of glaucoma and other disorders of the eye. Is the patient up to date with their annual eye exam?  Yes  Who is the provider or what is the name of the office in which the patient attends annual eye exams? [facility] If pt is not established with a provider, would they like to be referred to a provider to establish care? Yes .   Dental Screening: Recommended annual dental exams for proper oral hygiene   Community Resource Referral / Chronic Care  Management: CRR required this visit?  No   CCM required this visit?  No     Plan:     I have personally reviewed and noted the following in the patient's chart:   Medical and social history Use of alcohol, tobacco or illicit drugs  Current medications and supplements including opioid prescriptions. Patient is not currently taking opioid prescriptions. Functional ability and status Nutritional status Physical activity Advanced directives List of other physicians Hospitalizations, surgeries, and ER visits in previous 12 months Vitals Screenings to include cognitive, depression, and falls Referrals and appointments  In addition, I have reviewed and discussed with patient certain preventive protocols, quality metrics, and best practice recommendations. A written personalized care plan for preventive services as well as general preventive health recommendations were provided to patient.     Francis Doenges Sharlyne Cai, CMA   08/05/2023   After Visit Summary: (MyChart) Due to this being a telephonic visit, the after visit summary with patients personalized plan was offered to patient via MyChart   Nurse Notes: None

## 2023-08-05 NOTE — Patient Instructions (Signed)
[name] , Thank you for taking time to come for your Medicare Wellness Visit. I appreciate your ongoing commitment to your health goals. Please review the following plan we discussed and let me know if I can assist you in the future.   These are the goals we discussed:  Goals   None     This is a list of the screening recommended for you and due dates:  Health Maintenance  Topic Date Due   COVID-19 Vaccine (5 - 2023-24 season) 08/09/2022   Flu Shot  07/10/2023   Medicare Annual Wellness Visit  08/04/2024   Mammogram  08/06/2024   Colon Cancer Screening  08/15/2032   DTaP/Tdap/Td vaccine (3 - Td or Tdap) 07/21/2033   Pneumonia Vaccine  Completed   DEXA scan (bone density measurement)  Completed   Zoster (Shingles) Vaccine  Completed   HPV Vaccine  Aged Out   Hepatitis C Screening  Discontinued     Next appointment: Follow up in one year for your annual wellness visit    Preventive Care 65 Years and Older, Female Preventive care refers to lifestyle choices and visits with your health care provider that can promote health and wellness. What does preventive care include? A yearly physical exam. This is also called an annual well check. Dental exams once or twice a year. Routine eye exams. Ask your health care provider how often you should have your eyes checked. Personal lifestyle choices, including: Daily care of your teeth and gums. Regular physical activity. Eating a healthy diet. Avoiding tobacco and drug use. Limiting alcohol use. Practicing safe sex. Taking low-dose aspirin every day. Taking vitamin and mineral supplements as recommended by your health care provider. What happens during an annual well check? The services and screenings done by your health care provider during your annual well check will depend on your age, overall health, lifestyle risk factors, and family history of disease. Counseling  Your health care provider may ask you questions about  your: Alcohol use. Tobacco use. Drug use. Emotional well-being. Home and relationship well-being. Sexual activity. Eating habits. History of falls. Memory and ability to understand (cognition). Work and work Astronomer. Reproductive health. Screening  You may have the following tests or measurements: Height, weight, and BMI. Blood pressure. Lipid and cholesterol levels. These may be checked every 5 years, or more frequently if you are over 46 years old. Skin check. Lung cancer screening. You may have this screening every year starting at age 1 if you have a 30-pack-year history of smoking and currently smoke or have quit within the past 15 years. Fecal occult blood test (FOBT) of the stool. You may have this test every year starting at age 47. Flexible sigmoidoscopy or colonoscopy. You may have a sigmoidoscopy every 5 years or a colonoscopy every 10 years starting at age 38. Hepatitis C blood test. Hepatitis B blood test. Sexually transmitted disease (STD) testing. Diabetes screening. This is done by checking your blood sugar (glucose) after you have not eaten for a while (fasting). You may have this done every 1-3 years. Bone density scan. This is done to screen for osteoporosis. You may have this done starting at age 16. Mammogram. This may be done every 1-2 years. Talk to your health care provider about how often you should have regular mammograms. Talk with your health care provider about your test results, treatment options, and if necessary, the need for more tests. Vaccines  Your health care provider may recommend certain vaccines, such as:  Influenza vaccine. This is recommended every year. Tetanus, diphtheria, and acellular pertussis (Tdap, Td) vaccine. You may need a Td booster every 10 years. Zoster vaccine. You may need this after age 4. Pneumococcal 13-valent conjugate (PCV13) vaccine. One dose is recommended after age 40. Pneumococcal polysaccharide (PPSV23) vaccine.  One dose is recommended after age 79. Talk to your health care provider about which screenings and vaccines you need and how often you need them. This information is not intended to replace advice given to you by your health care provider. Make sure you discuss any questions you have with your health care provider. Document Released: 12/22/2015 Document Revised: 08/14/2016 Document Reviewed: 09/26/2015 Elsevier Interactive Patient Education  2017 [employer].  Fall Prevention in the Home Falls can cause injuries. They can happen to people of all ages. There are many things you can do to make your home safe and to help prevent falls. What can I do on the outside of my home? Regularly fix the edges of walkways and driveways and fix any cracks. Remove anything that might make you trip as you walk through a door, such as a raised step or threshold. Trim any bushes or trees on the path to your home. Use bright outdoor lighting. Clear any walking paths of anything that might make someone trip, such as rocks or tools. Regularly check to see if handrails are loose or broken. Make sure that both sides of any steps have handrails. Any raised decks and porches should have guardrails on the edges. Have any leaves, snow, or ice cleared regularly. Use sand or salt on walking paths during winter. Clean up any spills in your garage right away. This includes oil or grease spills. What can I do in the bathroom? Use night lights. Install grab bars by the toilet and in the tub and shower. Do not use towel bars as grab bars. Use non-skid mats or decals in the tub or shower. If you need to sit down in the shower, use a plastic, non-slip stool. Keep the floor dry. Clean up any water that spills on the floor as soon as it happens. Remove soap buildup in the tub or shower regularly. Attach bath mats securely with double-sided non-slip rug tape. Do not have throw rugs and other things on the floor that can make  you trip. What can I do in the bedroom? Use night lights. Make sure that you have a light by your bed that is easy to reach. Do not use any sheets or blankets that are too big for your bed. They should not hang down onto the floor. Have a firm chair that has side arms. You can use this for support while you get dressed. Do not have throw rugs and other things on the floor that can make you trip. What can I do in the kitchen? Clean up any spills right away. Avoid walking on wet floors. Keep items that you use a lot in easy-to-reach places. If you need to reach something above you, use a strong step stool that has a grab bar. Keep electrical cords out of the way. Do not use floor polish or wax that makes floors slippery. If you must use wax, use non-skid floor wax. Do not have throw rugs and other things on the floor that can make you trip. What can I do with my stairs? Do not leave any items on the stairs. Make sure that there are handrails on both sides of the stairs and use them.  Fix handrails that are broken or loose. Make sure that handrails are as long as the stairways. Check any carpeting to make sure that it is firmly attached to the stairs. Fix any carpet that is loose or worn. Avoid having throw rugs at the top or bottom of the stairs. If you do have throw rugs, attach them to the floor with carpet tape. Make sure that you have a light switch at the top of the stairs and the bottom of the stairs. If you do not have them, ask someone to add them for you. What else can I do to help prevent falls? Wear shoes that: Do not have high heels. Have rubber bottoms. Are comfortable and fit you well. Are closed at the toe. Do not wear sandals. If you use a stepladder: Make sure that it is fully opened. Do not climb a closed stepladder. Make sure that both sides of the stepladder are locked into place. Ask someone to hold it for you, if possible. Clearly mark and make sure that you can  see: Any grab bars or handrails. First and last steps. Where the edge of each step is. Use tools that help you move around (mobility aids) if they are needed. These include: Canes. Walkers. Scooters. Crutches. Turn on the lights when you go into a dark area. Replace any light bulbs as soon as they burn out. Set up your furniture so you have a clear path. Avoid moving your furniture around. If any of your floors are uneven, fix them. If there are any pets around you, be aware of where they are. Review your medicines with your doctor. Some medicines can make you feel dizzy. This can increase your chance of falling. Ask your doctor what other things that you can do to help prevent falls. This information is not intended to replace advice given to you by your health care provider. Make sure you discuss any questions you have with your health care provider. Document Released: 09/21/2009 Document Revised: 05/02/2016 Document Reviewed: 12/30/2014 Elsevier Interactive Patient Education  2017 [employer].

## 2023-08-07 ENCOUNTER — Encounter: Payer: Self-pay | Admitting: Pulmonary Disease

## 2023-08-07 MED ORDER — ZOLPIDEM TARTRATE 10 MG PO TABS: 10.0000 mg | ORAL_TABLET | Freq: Every evening | ORAL | 1 refills | Status: DC | PRN

## 2023-08-08 NOTE — Telephone Encounter (Signed)
Medication was sent.  Responded via other message.

## 2023-08-10 DIAGNOSIS — G4733 Obstructive sleep apnea (adult) (pediatric): Secondary | ICD-10-CM | POA: Diagnosis not present

## 2023-08-12 ENCOUNTER — Ambulatory Visit (HOSPITAL_BASED_OUTPATIENT_CLINIC_OR_DEPARTMENT_OTHER)
Admission: RE | Admit: 2023-08-12 | Discharge: 2023-08-12 | Disposition: A | Discharge status: Home / Self Care | Payer: Medicare HMO | Source: Ambulatory Visit | Attending: Family | Admitting: Family

## 2023-08-12 ENCOUNTER — Encounter (HOSPITAL_BASED_OUTPATIENT_CLINIC_OR_DEPARTMENT_OTHER): Payer: Self-pay

## 2023-08-12 DIAGNOSIS — Z1231 Encounter for screening mammogram for malignant neoplasm of breast: Secondary | ICD-10-CM | POA: Insufficient documentation

## 2023-08-21 DIAGNOSIS — G4733 Obstructive sleep apnea (adult) (pediatric): Secondary | ICD-10-CM | POA: Diagnosis not present

## 2023-09-09 DIAGNOSIS — G4733 Obstructive sleep apnea (adult) (pediatric): Secondary | ICD-10-CM | POA: Diagnosis not present

## 2023-09-17 DIAGNOSIS — Z9889 Other specified postprocedural states: Secondary | ICD-10-CM | POA: Diagnosis not present

## 2023-09-17 DIAGNOSIS — M1990 Unspecified osteoarthritis, unspecified site: Secondary | ICD-10-CM | POA: Diagnosis not present

## 2023-09-17 DIAGNOSIS — Z6841 Body Mass Index (BMI) 40.0 and over, adult: Secondary | ICD-10-CM | POA: Diagnosis not present

## 2023-09-17 DIAGNOSIS — G4733 Obstructive sleep apnea (adult) (pediatric): Secondary | ICD-10-CM | POA: Diagnosis not present

## 2023-09-20 DIAGNOSIS — G4733 Obstructive sleep apnea (adult) (pediatric): Secondary | ICD-10-CM | POA: Diagnosis not present

## 2023-09-23 DIAGNOSIS — G4733 Obstructive sleep apnea (adult) (pediatric): Secondary | ICD-10-CM | POA: Diagnosis not present

## 2023-09-23 DIAGNOSIS — Z6841 Body Mass Index (BMI) 40.0 and over, adult: Secondary | ICD-10-CM | POA: Diagnosis not present

## 2023-10-02 DIAGNOSIS — G4733 Obstructive sleep apnea (adult) (pediatric): Secondary | ICD-10-CM | POA: Diagnosis not present

## 2023-10-03 ENCOUNTER — Other Ambulatory Visit: Payer: Self-pay | Admitting: Pulmonary Disease

## 2023-10-08 DIAGNOSIS — G4733 Obstructive sleep apnea (adult) (pediatric): Secondary | ICD-10-CM | POA: Diagnosis not present

## 2023-10-09 DIAGNOSIS — R635 Abnormal weight gain: Secondary | ICD-10-CM | POA: Diagnosis not present

## 2023-11-08 DIAGNOSIS — G4733 Obstructive sleep apnea (adult) (pediatric): Secondary | ICD-10-CM | POA: Diagnosis not present

## 2023-11-18 DIAGNOSIS — Z6841 Body Mass Index (BMI) 40.0 and over, adult: Secondary | ICD-10-CM | POA: Diagnosis not present

## 2023-11-18 DIAGNOSIS — R7303 Prediabetes: Secondary | ICD-10-CM | POA: Diagnosis not present

## 2023-11-25 ENCOUNTER — Encounter: Payer: Self-pay | Admitting: Family

## 2023-12-04 ENCOUNTER — Other Ambulatory Visit: Payer: Self-pay | Admitting: Pulmonary Disease

## 2023-12-08 ENCOUNTER — Telehealth: Payer: Self-pay | Admitting: Pulmonary Disease

## 2023-12-08 DIAGNOSIS — G4733 Obstructive sleep apnea (adult) (pediatric): Secondary | ICD-10-CM | POA: Diagnosis not present

## 2023-12-08 NOTE — Telephone Encounter (Signed)
Patient states needs refill for Ambien. Needs refills.Patient out of medication.  Patient scheduled 02/02/2024 with Dr. Wynona Neat. [facility] [place]. Patient phone number is [phone].

## 2023-12-08 NOTE — Telephone Encounter (Signed)
Pt last seen Sept 2023- has made appt for 02/02/24 with Dr Wynona Neat  She is asking for refill on ambien  Please advise, thanks!

## 2023-12-09 ENCOUNTER — Other Ambulatory Visit: Payer: Self-pay | Admitting: Pulmonary Disease

## 2023-12-09 ENCOUNTER — Encounter: Payer: Self-pay | Admitting: Pulmonary Disease

## 2023-12-09 MED ORDER — ZOLPIDEM TARTRATE 10 MG PO TABS: 10.0000 mg | ORAL_TABLET | Freq: Every evening | ORAL | 1 refills | Status: DC | PRN

## 2023-12-24 ENCOUNTER — Ambulatory Visit: Payer: Medicare HMO | Admitting: Orthopaedic Surgery

## 2023-12-24 VITALS — Ht 62.25 in | Wt 228.0 lb

## 2023-12-24 DIAGNOSIS — Z96651 Presence of right artificial knee joint: Secondary | ICD-10-CM

## 2023-12-24 DIAGNOSIS — M1712 Unilateral primary osteoarthritis, left knee: Secondary | ICD-10-CM | POA: Diagnosis not present

## 2023-12-24 NOTE — Progress Notes (Signed)
 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  She does have a history of a right knee replacement performed in [place] around 2013.  That knee has been hurting her some.  She is a former patient of Dr. Aviva Lemmings.  She has been on a weight loss journey.  She did weigh I believe over 250 pounds.  Today she reports a weight of 228 pounds and with her height that calculates to a BMI of 41.37.  She is interested in having her left knee replaced but she understands we need to have her lose more weight before she can qualify given that she is part of the [facility] and we are not allowed to schedule surgery until someone's BMI is below 40.  She is not a diabetic.  She is active.  She has had multiple steroid injections in her left knee.  Her left knee has varus malalignment that is correctable.  There is a lot of pain throughout the arc of motion of that knee and previous x-rays from 2023 show severe end-stage arthritis of her left knee with bone-on-bone wear and varus malalignment.  This involves all 3 compartments.  We will see her back in 4 weeks from now and at that visit we need a repeat weight and BMI calculation.  She needs to be weighed with not her shoes on and with his minimal weight from close.  Also that visit we need a new standing AP and lateral of her left arthritic knee but also an AP and lateral of her right total knee arthroplasty since it is beginning to hurt.  She agrees with this treatment plan.  Will see her back in 4 weeks.

## 2024-01-01 DIAGNOSIS — Z6841 Body Mass Index (BMI) 40.0 and over, adult: Secondary | ICD-10-CM | POA: Diagnosis not present

## 2024-01-01 DIAGNOSIS — Z713 Dietary counseling and surveillance: Secondary | ICD-10-CM | POA: Diagnosis not present

## 2024-01-14 DIAGNOSIS — Z6841 Body Mass Index (BMI) 40.0 and over, adult: Secondary | ICD-10-CM | POA: Diagnosis not present

## 2024-01-18 ENCOUNTER — Other Ambulatory Visit: Payer: Self-pay | Admitting: Family

## 2024-01-20 NOTE — Telephone Encounter (Signed)
Please contact pt to schedule a follow up visit.

## 2024-01-21 ENCOUNTER — Ambulatory Visit (INDEPENDENT_AMBULATORY_CARE_PROVIDER_SITE_OTHER): Payer: Medicare HMO | Admitting: Orthopaedic Surgery

## 2024-01-21 ENCOUNTER — Other Ambulatory Visit (INDEPENDENT_AMBULATORY_CARE_PROVIDER_SITE_OTHER): Payer: Medicare HMO

## 2024-01-21 ENCOUNTER — Encounter: Payer: Self-pay | Admitting: Orthopaedic Surgery

## 2024-01-21 ENCOUNTER — Other Ambulatory Visit (INDEPENDENT_AMBULATORY_CARE_PROVIDER_SITE_OTHER): Payer: Self-pay

## 2024-01-21 VITALS — Ht 63.5 in | Wt 221.0 lb

## 2024-01-21 DIAGNOSIS — Z96651 Presence of right artificial knee joint: Secondary | ICD-10-CM | POA: Diagnosis not present

## 2024-01-21 DIAGNOSIS — M1712 Unilateral primary osteoarthritis, left knee: Secondary | ICD-10-CM

## 2024-01-21 NOTE — Progress Notes (Signed)
The patient is coming again for follow-up as it relates to severe arthritis with her left knee.  She has a right knee replacement that was done in [place] back in 2013 and that has been somewhat painful to her.  She is in need of a knee replacement on the left side and has been well-documented.  She has been on a weight loss journey.  Her BMI at her last visit was over 41.  Her BMI today is down to 38.53.  On examination her right knee is anatomically aligned with no significant effusion and good range of motion.  It feels ligamentously stable.  Her left knee has significant varus malalignment that is somewhat correctable.  There is significant medial and lateral joint line tenderness and severe patellofemoral crepitation throughout the arc of motion of her knee.  X-rays of her left knee show severe end-stage arthritis with bone-on-bone wear the medial compartment and patellofemoral joint.  There are osteophytes in all 3 compartments and significant varus malalignment.  Her right knee shows a well-seated right total knee arthroplasty with no complicating features.  She is definitely in need of knee replacement surgery for her left knee.  Her pain is 10 out of 10 and her knee pain is detrimentally affecting her mobility, her quality of life and her actives daily living.  Having had this before on the right side she is fully aware what the risks and benefits of surgery involve and what to expect from an intraoperative and postoperative standpoint.  We will work on getting her scheduled in the near future for a left total knee replacement.

## 2024-01-21 NOTE — Telephone Encounter (Signed)
Lvm 2 sch.

## 2024-01-22 DIAGNOSIS — Z6841 Body Mass Index (BMI) 40.0 and over, adult: Secondary | ICD-10-CM | POA: Diagnosis not present

## 2024-01-22 DIAGNOSIS — Z713 Dietary counseling and surveillance: Secondary | ICD-10-CM | POA: Diagnosis not present

## 2024-01-29 ENCOUNTER — Encounter: Payer: Self-pay | Admitting: Orthopaedic Surgery

## 2024-02-02 ENCOUNTER — Encounter: Payer: Self-pay | Admitting: Pulmonary Disease

## 2024-02-02 ENCOUNTER — Ambulatory Visit: Payer: Medicare HMO | Admitting: Pulmonary Disease

## 2024-02-02 VITALS — BP 129/54 | HR 81 | Temp 97.6°F | Ht 63.0 in | Wt 228.8 lb

## 2024-02-02 DIAGNOSIS — G4733 Obstructive sleep apnea (adult) (pediatric): Secondary | ICD-10-CM

## 2024-02-02 MED ORDER — ESZOPICLONE 2 MG PO TABS: 2.0000 mg | ORAL_TABLET | Freq: Every evening | ORAL | 3 refills | Status: DC | PRN

## 2024-02-02 NOTE — Progress Notes (Signed)
 Beth [name]    [id number]    03-26-1956  Primary Care Physician:O'Sullivan, Efraim Kaufmann, NP  Referring Physician: Sandford Craze, NP [address] [place],  [place] [address]  Chief complaint:   Patient being seen for obstructive sleep apnea  HPI:  Remains compliant with CPAP with no significant concerns about her CPAP Uses CPAP regularly Wakes up feeling like she is at a good nights rest  Still having issues with insomnia  Ambien does not seem to be working as well  She has used CPAP over 20 years   Most recent study was in January 2022 showing moderate obstructive sleep apnea Has had to 2 prior sleep studies in the past both over 52 years old She has been using an oral device since then  Multiple knee surgeries, shoulder surgery Needs a repeat knee surgery  Long-term history of difficulty falling asleep Has tried multiple sleep aids in the past that have not helped  She is on an antidepressant  Admits to falling asleep around 11 2 1, it usually takes hours to go to sleep 1-2 awakenings Final wake up time between 7 and 9 AM  Weight is up about 50 pounds  Has pets in the house but they do not bother her sleep  Outpatient Encounter Medications as of 02/02/2024  Medication Sig   b complex vitamins tablet Take 1 tablet by mouth daily.   CALCIUM PO Take 1 tablet by mouth daily.   celecoxib (CELEBREX) 200 MG capsule TAKE 1 CAPSULE BY MOUTH EVERY DAY   Cholecalciferol (VITAMIN D) 50 MCG (2000 UT) CAPS Take 2,000 Units by mouth daily.   ferrous sulfate 325 (65 FE) MG tablet Take 325 mg by mouth daily with breakfast.   Multiple Minerals (CALCIUM-MAGNESIUM-ZINC) TABS Take 3 tablets by mouth daily.   Omega-3 Fatty Acids (FISH OIL) 1000 MG CAPS Take 3 capsules (3,000 mg total) by mouth in the morning and at bedtime.   sertraline (ZOLOFT) 100 MG tablet TAKE 1 TABLET BY MOUTH EVERY DAY   triamcinolone cream (KENALOG) 0.1 % APPLY 1 APPLICATION  TOPICALLY 2 (TWO) TIMES DAILY. TO EAR CANALS   zolpidem (AMBIEN) 10 MG tablet Take 1 tablet (10 mg total) by mouth at bedtime as needed. for sleep   No facility-administered encounter medications on file as of 02/02/2024.    Allergies as of 02/02/2024   (No Known Allergies)    Past Medical History:  Diagnosis Date   Anxiety    Arthritis    Depression    Sleep apnea    does not uses CPAP; uses mouth guard.     Past Surgical History:  Procedure Laterality Date   ANKLE SURGERY Right 2008   BACK SURGERY  2009   COLONOSCOPY     COSMETIC SURGERY     JOINT REPLACEMENT  '13, '21   r knee, r shoulder   REPLACEMENT TOTAL KNEE Right 2012   SPINE SURGERY  '09   back   TONSILLECTOMY     TOTAL SHOULDER ARTHROPLASTY Right 03/28/2020   Procedure: RIGHT SHOULDER REPLACEMENT;  Surgeon: Cammy Copa, MD;  Location: [facility] OR;  Service: Orthopedics;  Laterality: Right;    Family History  Problem Relation Age of Onset   Breast cancer Mother 66   Cancer Mother    Depression Mother    Diabetes Father    Hydrocephalus Father    Lupus Maternal Aunt    Tuberculosis Maternal Grandmother  Alcohol abuse Maternal Grandfather        suicide   Diabetes Mellitus II Paternal Grandmother    Peripheral vascular disease Paternal Grandmother    Colon cancer Neg Hx    Colon polyps Neg Hx    Esophageal cancer Neg Hx    Stomach cancer Neg Hx    Rectal cancer Neg Hx     Social History   Socioeconomic History   Marital status: Married    Spouse name: Ron   Number of children: 2   Years of education: Not on file   Highest education level: Not on file  Occupational History   Occupation: retired  Tobacco Use   Smoking status: Former    Current packs/day: 0.00    Types: Cigarettes    Quit date: 07/22/1989    Years since quitting: 34.5   Smokeless tobacco: Never  Vaping Use   Vaping status: Never Used  Substance and Sexual Activity   Alcohol use: Yes    Comment: social, couple times a  month   Drug use: Never   Sexual activity: Yes    Partners: Male  Other Topics Concern   Not on file  Social History Narrative   Retired Music therapist   Divorced (from [place] originally)   Remarried 2015 (husband is a IT trainer)   Has a dog   2 children (son and daughter) both married, daughter lives in [place], son in [place]. No grandchildren   Enjoys baking, sewing, reading   Social Drivers of Corporate investment banker Strain: Low Risk  (08/04/2023)   Overall Financial Resource Strain (CARDIA)    Difficulty of Paying Living Expenses: Not hard at all  Food Insecurity: No Food Insecurity (08/04/2023)   Hunger Vital Sign    Worried About Running Out of Food in the Last Year: Never true    Ran Out of Food in the Last Year: Never true  Transportation Needs: No Transportation Needs (08/04/2023)   PRAPARE - Administrator, Civil Service (Medical): No    Lack of Transportation (Non-Medical): No  Physical Activity: Patient Declined (08/04/2023)   Exercise Vital Sign    Days of Exercise per Week: Patient declined    Minutes of Exercise per Session: Patient declined  Stress: No Stress Concern Present (08/04/2023)   [employer] of Occupational Health - Occupational Stress Questionnaire    Feeling of Stress : Not at all  Social Connections: Unknown (08/04/2023)   Social Connection and Isolation Panel [NHANES]    Frequency of Communication with Friends and Family: More than three times a week    Frequency of Social Gatherings with Friends and Family: Once a week    Attends Religious Services: Not on Insurance claims handler of Clubs or Organizations: Yes    Attends Banker Meetings: Patient declined    Marital Status: Married  Catering manager Violence: Not At Risk (08/05/2023)   Humiliation, Afraid, Rape, and Kick questionnaire    Fear of Current or Ex-Partner: No    Emotionally Abused: No    Physically Abused: No    Sexually Abused: No    Review of Systems   Constitutional:  Positive for fatigue.  Respiratory:  Positive for apnea.   Psychiatric/Behavioral:  Positive for sleep disturbance.     Vitals:   02/02/24 0938  BP: (!) 129/54  Pulse: 81  Temp: 97.6 F (36.4 C)  SpO2: 94%     Physical Exam Constitutional:  Appearance: She is obese.  HENT:     Head: Normocephalic.     Nose: No congestion.     Mouth/Throat:     Mouth: Mucous membranes are moist.     Comments: Mallampati 4, crowded oropharynx, macroglossia Eyes:     General:        Right eye: No discharge.        Left eye: No discharge.  Cardiovascular:     Rate and Rhythm: Regular rhythm. Tachycardia present.     Pulses: Normal pulses.     Heart sounds: No murmur heard.    No friction rub.  Pulmonary:     Effort: Pulmonary effort is normal. No respiratory distress.     Breath sounds: Normal breath sounds. No stridor. No wheezing or rhonchi.  Musculoskeletal:     Cervical back: No rigidity or tenderness.  Neurological:     Mental Status: She is alert.  Psychiatric:        Mood and Affect: Mood normal.    Data Reviewed: Previous sleep studies not available to be reviewed  Most recent sleep study reviewed showing moderate obstructive sleep apnea  Download shows compliance of 94% AutoSet 5-20 Residual AHI of 1.6 No significant mask leaks  Assessment:  Obstructive sleep apnea -Tolerating CPAP well Not having issues with CPAP  Insomnia -Did try Ambien -Ambien not working as well any longer Obesity   Plan/Recommendations:  Continue CPAP  Prescription sent in for Lunesta 2 mg  Encouraged to call with significant concerns  Encourage weight loss efforts  Tentative follow-up in about a year  I spent 30 minutes dedicated to the care of this patient on the date of this encounter to include previsit review of records, face-to-face time with the patient discussing conditions above, post visit ordering of testing,ordering  medications,independentlyinterpreting results, clinical documentation with electronic health record and communicated necessary findings to members of the patient's care team  Virl Diamond MD  Pulmonary and Critical Care 02/02/2024, 9:54 AM  CC: Sandford Craze, NP

## 2024-02-02 NOTE — Patient Instructions (Addendum)
 I will see you a year from now  We will send in a prescription for Lunesta  Call us with significant concerns  Download from your machine shows it is working well  Prescription for [facility] sent to pharmacy with 2 refills

## 2024-02-04 ENCOUNTER — Encounter: Payer: Self-pay | Admitting: Pulmonary Disease

## 2024-02-04 ENCOUNTER — Telehealth: Payer: Self-pay | Admitting: Pulmonary Disease

## 2024-02-04 NOTE — Telephone Encounter (Signed)
 Patient needs prior auth approval for her medication Lunesta. It was supposed to be sent over by her pharmacy.   [facility] [place]

## 2024-02-05 ENCOUNTER — Telehealth: Payer: Self-pay

## 2024-02-05 ENCOUNTER — Other Ambulatory Visit (HOSPITAL_COMMUNITY): Payer: Self-pay

## 2024-02-05 NOTE — Telephone Encounter (Signed)
 Patient is returning phone call. Patient phone number is [phone].

## 2024-02-05 NOTE — Telephone Encounter (Signed)
 Pharmacy Patient Advocate Encounter  Received notification from [facility] that Prior Authorization for Eszopiclone 2MG  tablets  has been DENIED.  Full denial letter will be uploaded to the media tab. See denial reason below.   PA #/Case ID/Reference #: We denied coverage for this drug because: The requested drug is not on your plan's formulary (list of covered drugs). Your Medicare Part D drug plan was asked to cover a drug that is not on the formulary (this is called a formulary exception). Your prescriber did not provide the detailed information that is required in order to approve the request. To receive a formulary exception, per the Part D Coverage Determination guidance Section 40.5.2 and 40.5.3, your prescriber must provide information that documents at least one of the following has occurred:  - You have tried the formulary drugs for the treatment of your condition and they did not work for you. OR - The formulary drugs could cause adverse effects. OR - The formulary drugs would be less effective for your condition than the requested drug.  Talk to your prescriber to see if the following covered alternative(s) would be right for you:  zaleplon capsule (requires prior authorization for members of age 60 or greater) (Different quantity limits apply depending on the strength of the medication prescribed. Please consult the Medicare Part D plan's formulary for the specific quantity limit.) triazolam tablet (requires prior authorization for members of age 19 or greater) (quantity limit of 60 tablets every 30 days) doxepin HCl tablet (quantity limit of 30 tablets every 30 days) Dayvigo tablet (quantity limit of 30 tablets every 30 days).

## 2024-02-05 NOTE — Telephone Encounter (Signed)
 Patient is completely out of her Lunesta   [facility] [place]

## 2024-02-05 NOTE — Telephone Encounter (Signed)
 I called and spoke with the pt  She states Lunesta requires PA  I advised will route to PA team and mark urgent, as she is out of her medication

## 2024-02-05 NOTE — Telephone Encounter (Signed)
*  Pulm  Pharmacy Patient Advocate Encounter   Received notification from Physician's Office that prior authorization for Eszopiclone 2MG  tablets  is required/requested.   Insurance verification completed.   The patient is insured through [facility] .   Per test claim: PA required; PA submitted to above mentioned insurance via CoverMyMeds Key/confirmation #/EOC [id number] Status is pending

## 2024-02-05 NOTE — Telephone Encounter (Signed)
 PA request has been Submitted. New Encounter created for follow up. For additional info see Pharmacy Prior Auth telephone encounter from 02/27.

## 2024-02-06 NOTE — Telephone Encounter (Signed)
 We denied coverage for this drug because: The requested drug is not on your plan's formulary (list of covered drugs).    Message sent to patient to contact insurance company for formulary alternatives.

## 2024-02-06 NOTE — Telephone Encounter (Signed)
 Beth [name] is not cover per encounter so patient will need Beth [name] called in.   [facility] in [place]

## 2024-02-06 NOTE — Telephone Encounter (Signed)
 Routing to Dr Wynona Neat for the Ambien

## 2024-02-09 ENCOUNTER — Other Ambulatory Visit: Payer: Self-pay | Admitting: Pulmonary Disease

## 2024-02-09 MED ORDER — ZOLPIDEM TARTRATE 10 MG PO TABS: 10.0000 mg | ORAL_TABLET | Freq: Every evening | ORAL | 3 refills | Status: DC | PRN

## 2024-02-10 ENCOUNTER — Other Ambulatory Visit: Payer: Self-pay | Admitting: Pulmonary Disease

## 2024-02-11 ENCOUNTER — Other Ambulatory Visit: Payer: Self-pay | Admitting: Family

## 2024-02-20 DIAGNOSIS — G4733 Obstructive sleep apnea (adult) (pediatric): Secondary | ICD-10-CM | POA: Diagnosis not present

## 2024-02-27 NOTE — Progress Notes (Signed)
 Surgical Instructions   Your procedure is scheduled on March 09, 2024 Report to [facility] Main Entrance "A" at  9:30 A.M., then check in with the Admitting office. Any questions or running late day of surgery: call [phone]  Questions prior to your surgery date: call [phone], Monday-Friday, 8am-4pm. If you experience any cold or flu symptoms such as cough, fever, chills, shortness of breath, etc. between now and your scheduled surgery, please notify us at the above number.     Remember:  Do not eat after midnight the night before your surgery  You may drink clear liquids until 8:30 the morning of your surgery.   Clear liquids allowed are: Water, Non-Citrus Juices (without pulp), Carbonated Beverages, Clear Tea (no milk, honey, etc.), Black Coffee Only (NO MILK, CREAM OR POWDERED CREAMER of any kind), and Gatorade.   Patient Instructions  The night before surgery:  No food after midnight. ONLY clear liquids after midnight  The day of surgery (if you do NOT have diabetes):  Drink ONE (1) Pre-Surgery Clear Ensure by 8:30 the morning of surgery. Drink in one sitting. Do not sip.  This drink was given to you during your hospital  pre-op appointment visit.  Nothing else to drink after completing the  Pre-Surgery Clear Ensure.          If you have questions, please contact your surgeon's office.  Take these medicines the morning of surgery with A SIP OF WATER  sertraline (ZOLOFT)   May take these medicines IF NEEDED:    One week prior to surgery, STOP taking any Aspirin (unless otherwise instructed by your surgeon) Aleve, Naproxen, Ibuprofen, Motrin, Advil, Goody's, BC's, all herbal medications, fish oil, and non-prescription vitamins.                     Do NOT Smoke (Tobacco/Vaping) for 24 hours prior to your procedure.  If you use a CPAP at night, you may bring your mask/headgear for your overnight stay.   You will be asked to remove any contacts, glasses,  piercing's, hearing aid's, dentures/partials prior to surgery. Please bring cases for these items if needed.    Patients discharged the day of surgery will not be allowed to drive home, and someone needs to stay with them for 24 hours.  SURGICAL WAITING ROOM VISITATION Patients may have no more than 2 support people in the waiting area - these visitors may rotate.   Pre-op nurse will coordinate an appropriate time for 1 ADULT support person, who may not rotate, to accompany patient in pre-op.  Children under the age of 89 must have an adult with them who is not the patient and must remain in the main waiting area with an adult.  If the patient needs to stay at the hospital during part of their recovery, the visitor guidelines for inpatient rooms apply.  Please refer to the [facility] website for the visitor guidelines for any additional information.   If you received a COVID test during your pre-op visit  it is requested that you wear a mask when out in public, stay away from anyone that may not be feeling well and notify your surgeon if you develop symptoms. If you have been in contact with anyone that has tested positive in the last 10 days please notify you surgeon.      Pre-operative 5 CHG Bathing Instructions   You can play a key role in reducing the risk of infection after surgery. Your skin  needs to be as free of germs as possible. You can reduce the number of germs on your skin by washing with CHG (chlorhexidine gluconate) soap before surgery. CHG is an antiseptic soap that kills germs and continues to kill germs even after washing.   DO NOT use if you have an allergy to chlorhexidine/CHG or antibacterial soaps. If your skin becomes reddened or irritated, stop using the CHG and notify one of our RNs at [phone].   Please shower with the CHG soap starting 4 days before surgery using the following schedule:     Please keep in mind the following:  DO NOT shave, including legs  and underarms, starting the day of your first shower.   You may shave your face at any point before/day of surgery.  Place clean sheets on your bed the day you start using CHG soap. Use a clean washcloth (not used since being washed) for each shower. DO NOT sleep with pets once you start using the CHG.   CHG Shower Instructions:  Wash your face and private area with normal soap. If you choose to wash your hair, wash first with your normal shampoo.  After you use shampoo/soap, rinse your hair and body thoroughly to remove shampoo/soap residue.  Turn the water OFF and apply about 3 tablespoons (45 ml) of CHG soap to a CLEAN washcloth.  Apply CHG soap ONLY FROM YOUR NECK DOWN TO YOUR TOES (washing for 3-5 minutes)  DO NOT use CHG soap on face, private areas, open wounds, or sores.  Pay special attention to the area where your surgery is being performed.  If you are having back surgery, having someone wash your back for you may be helpful. Wait 2 minutes after CHG soap is applied, then you may rinse off the CHG soap.  Pat dry with a clean towel  Put on clean clothes/pajamas   If you choose to wear lotion, please use ONLY the CHG-compatible lotions that are listed below.  Additional instructions for the day of surgery: DO NOT APPLY any lotions, deodorants, cologne, or perfumes.   Do not bring valuables to the hospital. [facility] is not responsible for any belongings/valuables. Do not wear nail polish, gel polish, artificial nails, or any other type of covering on natural nails (fingers and toes) Do not wear jewelry or makeup Put on clean/comfortable clothes.  Please brush your teeth.  Ask your nurse before applying any prescription medications to the skin.     CHG Compatible Lotions   Aveeno Moisturizing lotion  Cetaphil Moisturizing Cream  Cetaphil Moisturizing Lotion  Clairol Herbal Essence Moisturizing Lotion, Dry Skin  Clairol Herbal Essence Moisturizing Lotion, Extra Dry Skin   Clairol Herbal Essence Moisturizing Lotion, Normal Skin  Curel Age Defying Therapeutic Moisturizing Lotion with Alpha Hydroxy  Curel Extreme Care Body Lotion  Curel Soothing Hands Moisturizing Hand Lotion  Curel Therapeutic Moisturizing Cream, Fragrance-Free  Curel Therapeutic Moisturizing Lotion, Fragrance-Free  Curel Therapeutic Moisturizing Lotion, Original Formula  Eucerin Daily Replenishing Lotion  Eucerin Dry Skin Therapy Plus Alpha Hydroxy Crme  Eucerin Dry Skin Therapy Plus Alpha Hydroxy Lotion  Eucerin Original Crme  Eucerin Original Lotion  Eucerin Plus Crme Eucerin Plus Lotion  Eucerin TriLipid Replenishing Lotion  Keri Anti-Bacterial Hand Lotion  Keri Deep Conditioning Original Lotion Dry Skin Formula Softly Scented  Keri Deep Conditioning Original Lotion, Fragrance Free Sensitive Skin Formula  Keri Lotion Fast Absorbing Fragrance Free Sensitive Skin Formula  Keri Lotion Fast Absorbing Softly Scented Dry Skin Formula  Keri Original Lotion  [employer] Skin Renewal Lotion Keri Silky Smooth Lotion  Keri Silky Smooth Sensitive Skin Lotion  Nivea Body Creamy Conditioning Patent examiner Moisturizing Lotion Nivea Crme  Nivea Skin Firming Lotion  NutraDerm 30 Skin Lotion  NutraDerm Skin Lotion  NutraDerm Therapeutic Skin Cream  NutraDerm Therapeutic Skin Lotion  ProShield Protective Hand Cream  Provon moisturizing lotion  Please read over the following fact sheets that you were given.

## 2024-03-01 ENCOUNTER — Other Ambulatory Visit: Payer: Self-pay

## 2024-03-01 ENCOUNTER — Encounter (HOSPITAL_COMMUNITY): Payer: Self-pay

## 2024-03-01 ENCOUNTER — Encounter (HOSPITAL_COMMUNITY)
Admission: RE | Admit: 2024-03-01 | Discharge: 2024-03-01 | Disposition: A | Discharge status: Home / Self Care | Source: Ambulatory Visit | Attending: Orthopaedic Surgery | Admitting: Orthopaedic Surgery

## 2024-03-01 VITALS — BP 105/61 | HR 88 | Temp 97.7°F | Resp 20 | Ht 65.5 in | Wt 220.4 lb

## 2024-03-01 DIAGNOSIS — Z01812 Encounter for preprocedural laboratory examination: Secondary | ICD-10-CM | POA: Insufficient documentation

## 2024-03-01 DIAGNOSIS — M1712 Unilateral primary osteoarthritis, left knee: Secondary | ICD-10-CM | POA: Insufficient documentation

## 2024-03-01 DIAGNOSIS — Z01818 Encounter for other preprocedural examination: Secondary | ICD-10-CM | POA: Diagnosis present

## 2024-03-01 LAB — CBC
HCT: 40.7 % (ref 36.0–46.0)
Hemoglobin: 13.4 g/dL (ref 12.0–15.0)
MCH: 31.2 pg (ref 26.0–34.0)
MCHC: 32.9 g/dL (ref 30.0–36.0)
MCV: 94.9 fL (ref 80.0–100.0)
Platelets: 284 10*3/uL (ref 150–400)
RBC: 4.29 MIL/uL (ref 3.87–5.11)
RDW: 12.9 % (ref 11.5–15.5)
WBC: 10.1 10*3/uL (ref 4.0–10.5)
nRBC: 0 % (ref 0.0–0.2)

## 2024-03-01 LAB — BASIC METABOLIC PANEL
Anion gap: 6 (ref 5–15)
BUN: 32 mg/dL — ABNORMAL HIGH (ref 8–23)
CO2: 25 mmol/L (ref 22–32)
Calcium: 9.5 mg/dL (ref 8.9–10.3)
Chloride: 106 mmol/L (ref 98–111)
Creatinine, Ser: 1.21 mg/dL — ABNORMAL HIGH (ref 0.44–1.00)
GFR, Estimated: 49 mL/min — ABNORMAL LOW (ref >60)
Glucose, Bld: 102 mg/dL — ABNORMAL HIGH (ref 70–99)
Potassium: 4.3 mmol/L (ref 3.5–5.1)
Sodium: 137 mmol/L (ref 135–145)

## 2024-03-01 LAB — SURGICAL PCR SCREEN
MRSA, PCR: NEGATIVE
Staphylococcus aureus: NEGATIVE

## 2024-03-01 LAB — GLUCOSE, CAPILLARY: Glucose-Capillary: 100 mg/dL — ABNORMAL HIGH (ref 70–99)

## 2024-03-01 NOTE — Progress Notes (Signed)
 Surgical Instructions   Your procedure is scheduled on March 09, 2024 Report to [facility] Main Entrance "A" at  9:30 A.M., then check in with the Admitting office. Any questions or running late day of surgery: call [phone]  Questions prior to your surgery date: call [phone], Monday-Friday, 8am-4pm. If you experience any cold or flu symptoms such as cough, fever, chills, shortness of breath, etc. between now and your scheduled surgery, please notify us at the above number.     Remember:  Do not eat after midnight the night before your surgery  You may drink clear liquids until 8:30 the morning of your surgery.   Clear liquids allowed are: Water, Non-Citrus Juices (without pulp), Carbonated Beverages, Clear Tea (no milk, honey, etc.), Black Coffee Only (NO MILK, CREAM OR POWDERED CREAMER of any kind), and Gatorade.   Patient Instructions  The night before surgery:  No food after midnight. ONLY clear liquids after midnight  The day of surgery (if you do NOT have diabetes):  Drink ONE (1) Pre-Surgery Clear Ensure by 8:30 the morning of surgery. Drink in one sitting. Do not sip.  This drink was given to you during your hospital  pre-op appointment visit.  Nothing else to drink after completing the  Pre-Surgery Clear Ensure.          If you have questions, please contact your surgeon's office.  Take these medicines the morning of surgery with A SIP OF WATER  sertraline (ZOLOFT)   May take these medicines IF NEEDED:    One week prior to surgery, STOP taking any Aspirin (unless otherwise instructed by your surgeon), celecoxib (CELEBREX), Aleve, Naproxen, Ibuprofen, Motrin, Advil, Goody's, BC's, all herbal medications, fish oil, and non-prescription vitamins.                     Do NOT Smoke (Tobacco/Vaping) for 24 hours prior to your procedure.  If you use a CPAP at night, you may bring your mask/headgear for your overnight stay.   You will be asked to remove any  contacts, glasses, piercing's, hearing aid's, dentures/partials prior to surgery. Please bring cases for these items if needed.    Patients discharged the day of surgery will not be allowed to drive home, and someone needs to stay with them for 24 hours.  SURGICAL WAITING ROOM VISITATION Patients may have no more than 2 support people in the waiting area - these visitors may rotate.   Pre-op nurse will coordinate an appropriate time for 1 ADULT support person, who may not rotate, to accompany patient in pre-op.  Children under the age of 34 must have an adult with them who is not the patient and must remain in the main waiting area with an adult.  If the patient needs to stay at the hospital during part of their recovery, the visitor guidelines for inpatient rooms apply.  Please refer to the [facility] website for the visitor guidelines for any additional information.   If you received a COVID test during your pre-op visit  it is requested that you wear a mask when out in public, stay away from anyone that may not be feeling well and notify your surgeon if you develop symptoms. If you have been in contact with anyone that has tested positive in the last 10 days please notify you surgeon.      Pre-operative 5 CHG Bathing Instructions   You can play a key role in reducing the risk of infection after surgery.  Your skin needs to be as free of germs as possible. You can reduce the number of germs on your skin by washing with CHG (chlorhexidine gluconate) soap before surgery. CHG is an antiseptic soap that kills germs and continues to kill germs even after washing.   DO NOT use if you have an allergy to chlorhexidine/CHG or antibacterial soaps. If your skin becomes reddened or irritated, stop using the CHG and notify one of our RNs at [phone].   Please shower with the CHG soap starting 4 days before surgery using the following schedule:     Please keep in mind the following:  DO NOT  shave, including legs and underarms, starting the day of your first shower.   You may shave your face at any point before/day of surgery.  Place clean sheets on your bed the day you start using CHG soap. Use a clean washcloth (not used since being washed) for each shower. DO NOT sleep with pets once you start using the CHG.   CHG Shower Instructions:  Wash your face and private area with normal soap. If you choose to wash your hair, wash first with your normal shampoo.  After you use shampoo/soap, rinse your hair and body thoroughly to remove shampoo/soap residue.  Turn the water OFF and apply about 3 tablespoons (45 ml) of CHG soap to a CLEAN washcloth.  Apply CHG soap ONLY FROM YOUR NECK DOWN TO YOUR TOES (washing for 3-5 minutes)  DO NOT use CHG soap on face, private areas, open wounds, or sores.  Pay special attention to the area where your surgery is being performed.  If you are having back surgery, having someone wash your back for you may be helpful. Wait 2 minutes after CHG soap is applied, then you may rinse off the CHG soap.  Pat dry with a clean towel  Put on clean clothes/pajamas   If you choose to wear lotion, please use ONLY the CHG-compatible lotions that are listed below.  Additional instructions for the day of surgery: DO NOT APPLY any lotions, deodorants, cologne, or perfumes.   Do not bring valuables to the hospital. [facility] is not responsible for any belongings/valuables. Do not wear nail polish, gel polish, artificial nails, or any other type of covering on natural nails (fingers and toes) Do not wear jewelry or makeup Put on clean/comfortable clothes.  Please brush your teeth.  Ask your nurse before applying any prescription medications to the skin.     CHG Compatible Lotions   Aveeno Moisturizing lotion  Cetaphil Moisturizing Cream  Cetaphil Moisturizing Lotion  Clairol Herbal Essence Moisturizing Lotion, Dry Skin  Clairol Herbal Essence Moisturizing  Lotion, Extra Dry Skin  Clairol Herbal Essence Moisturizing Lotion, Normal Skin  Curel Age Defying Therapeutic Moisturizing Lotion with Alpha Hydroxy  Curel Extreme Care Body Lotion  Curel Soothing Hands Moisturizing Hand Lotion  Curel Therapeutic Moisturizing Cream, Fragrance-Free  Curel Therapeutic Moisturizing Lotion, Fragrance-Free  Curel Therapeutic Moisturizing Lotion, Original Formula  Eucerin Daily Replenishing Lotion  Eucerin Dry Skin Therapy Plus Alpha Hydroxy Crme  Eucerin Dry Skin Therapy Plus Alpha Hydroxy Lotion  Eucerin Original Crme  Eucerin Original Lotion  Eucerin Plus Crme Eucerin Plus Lotion  Eucerin TriLipid Replenishing Lotion  Keri Anti-Bacterial Hand Lotion  Keri Deep Conditioning Original Lotion Dry Skin Formula Softly Scented  Keri Deep Conditioning Original Lotion, Fragrance Free Sensitive Skin Formula  Keri Lotion Fast Absorbing Fragrance Free Sensitive Skin Formula  Keri Lotion Fast Absorbing Softly Scented Dry Skin  Formula  Keri Original Lotion  Keri Skin Renewal Lotion Keri Silky Smooth Lotion  Keri Silky Smooth Sensitive Skin Lotion  Nivea Body Creamy Conditioning Oil  Nivea Body Extra Enriched Lotion  Nivea Body Original Lotion  Nivea Body Sheer Moisturizing Lotion Nivea Crme  Nivea Skin Firming Lotion  NutraDerm 30 Skin Lotion  NutraDerm Skin Lotion  NutraDerm Therapeutic Skin Cream  NutraDerm Therapeutic Skin Lotion  ProShield Protective Hand Cream  Provon moisturizing lotion  Please read over the following fact sheets that you were given.

## 2024-03-01 NOTE — Progress Notes (Signed)
 PCP - Sandford Craze, NP Cardiologist - denies  PPM/ICD - denies Device Orders -  Rep Notified -   Chest x-ray - na EKG - na Stress Test - denies ECHO - denies Cardiac Cath - denies  Sleep Study - 12/13/2020 CPAP - yes  Fasting Blood Sugar - na Checks Blood Sugar _____ times a day  Last dose of GLP1 agonist-  na GLP1 instructions:   Blood Thinner Instructions:na Aspirin Instructions:na  ERAS Protcol -clears until 0830 PRE-SURGERY Ensure or G2- Ensure  COVID TEST- na    Anesthesia review: no  Patient denies shortness of breath, fever, cough and chest pain at PAT appointment   All instructions explained to the patient, with a verbal understanding of the material. Patient agrees to go over the instructions while at home for a better understanding. Patient also instructed to wear a mask when out in public prior to surgery. The opportunity to ask questions was provided.

## 2024-03-04 DIAGNOSIS — Z6841 Body Mass Index (BMI) 40.0 and over, adult: Secondary | ICD-10-CM | POA: Diagnosis not present

## 2024-03-04 DIAGNOSIS — Z713 Dietary counseling and surveillance: Secondary | ICD-10-CM | POA: Diagnosis not present

## 2024-03-08 ENCOUNTER — Telehealth: Payer: Self-pay | Admitting: *Deleted

## 2024-03-08 DIAGNOSIS — M1712 Unilateral primary osteoarthritis, left knee: Secondary | ICD-10-CM | POA: Insufficient documentation

## 2024-03-08 NOTE — H&P (Signed)
 TOTAL KNEE ADMISSION H&P  Patient is being admitted for left total knee arthroplasty.  Subjective:  Chief Complaint:left knee pain.  HPI: Beth [name], 68 y.o. female, has a history of pain and functional disability in the left knee due to arthritis and has failed non-surgical conservative treatments for greater than 12 weeks to includeNSAID's and/or analgesics, corticosteriod injections, viscosupplementation injections, supervised PT with diminished ADL's post treatment, use of assistive devices, weight reduction as appropriate, and activity modification.  Onset of symptoms was gradual, starting many years ago with gradually worsening course since that time. The patient noted no past surgery on the left knee(s).  Patient currently rates pain in the left knee(s) at 10 out of 10 with activity. Patient has night pain, worsening of pain with activity and weight bearing, pain that interferes with activities of daily living, pain with passive range of motion, crepitus, and joint swelling.  Patient has evidence of subchondral sclerosis, periarticular osteophytes, and joint space narrowing by imaging studies. There is no active infection.  Patient Active Problem List   Diagnosis Date Noted   Unilateral primary osteoarthritis, left knee 03/08/2024   Morbid obesity (HCC) 07/22/2023   Preventative health care 07/22/2023   Onychomycosis 01/14/2023   Eczema 01/14/2023   Hypertriglyceridemia 07/17/2022   Hyperglycemia 07/17/2022   Bilateral primary osteoarthritis of knee 01/17/2022   Chronic pain of left knee 01/15/2022   Bilateral carpal tunnel syndrome 01/15/2022   Microscopic hematuria 03/04/2021   OSA (obstructive sleep apnea) 07/27/2020   Insomnia 04/25/2020   Restless leg 04/25/2020   Depression 04/25/2020   Arthritis of shoulder 03/28/2020   Primary osteoarthritis, right shoulder 01/11/2020   Past Medical History:  Diagnosis Date   Anxiety    Arthritis    Depression    Sleep apnea     does not uses CPAP; uses mouth guard.     Past Surgical History:  Procedure Laterality Date   ANKLE SURGERY Right 2008   BACK SURGERY  2009   BUNIONECTOMY Right    COLONOSCOPY     COSMETIC SURGERY     JOINT REPLACEMENT  '13, '21   r knee, r shoulder   NISSEN FUNDOPLICATION     REPLACEMENT TOTAL KNEE Right 2012   SPINE SURGERY  '09   back   TONSILLECTOMY     TOTAL SHOULDER ARTHROPLASTY Right 03/28/2020   Procedure: RIGHT SHOULDER REPLACEMENT;  Surgeon: Cammy Copa, MD;  Location: [facility] OR;  Service: Orthopedics;  Laterality: Right;    No current facility-administered medications for this encounter.   Current Outpatient Medications  Medication Sig Dispense Refill Last Dose/Taking   b complex vitamins tablet Take 1 tablet by mouth daily.   Taking   CALCIUM PO Take 1 tablet by mouth daily.   Taking   celecoxib (CELEBREX) 200 MG capsule TAKE 1 CAPSULE BY MOUTH EVERY DAY 30 capsule 0 Taking   Cholecalciferol (VITAMIN D) 50 MCG (2000 UT) CAPS Take 2,000 Units by mouth daily.   Taking   ferrous sulfate 325 (65 FE) MG tablet Take 650 mg by mouth daily with breakfast.   Taking   magnesium oxide (MAG-OX) 400 (240 Mg) MG tablet Take 400 mg by mouth daily.   Taking   Omega-3 Fatty Acids (FISH OIL) 1000 MG CAPS Take 3 capsules (3,000 mg total) by mouth in the morning and at bedtime. (Patient taking differently: Take 3 capsules by mouth daily.)  0 Taking Differently   sertraline (ZOLOFT) 100 MG tablet TAKE 1 TABLET BY MOUTH EVERY  DAY 30 tablet 0 Taking   triamcinolone cream (KENALOG) 0.1 % APPLY 1 APPLICATION TOPICALLY 2 (TWO) TIMES DAILY. TO EAR CANALS (Patient taking differently: Apply 1 Application topically 2 (two) times daily as needed (itching). To ear canals) 30 g 0 Taking Differently   zolpidem (AMBIEN) 10 MG tablet Take 1 tablet (10 mg total) by mouth at bedtime as needed for sleep. (Patient taking differently: Take 10 mg by mouth at bedtime.) 30 tablet 3 Taking Differently   No  Known Allergies  Social History   Tobacco Use   Smoking status: Former    Current packs/day: 0.00    Types: Cigarettes    Quit date: 07/22/1989    Years since quitting: 34.6   Smokeless tobacco: Never  Substance Use Topics   Alcohol use: Yes    Comment: social, couple times a month    Family History  Problem Relation Age of Onset   Breast cancer Mother 39   Cancer Mother    Depression Mother    Diabetes Father    Hydrocephalus Father    Lupus Maternal Aunt    Tuberculosis Maternal Grandmother    Alcohol abuse Maternal Grandfather        suicide   Diabetes Mellitus II Paternal Grandmother    Peripheral vascular disease Paternal Grandmother    Colon cancer Neg Hx    Colon polyps Neg Hx    Esophageal cancer Neg Hx    Stomach cancer Neg Hx    Rectal cancer Neg Hx      Review of Systems  Objective:  Physical Exam Vitals reviewed.  Constitutional:      Appearance: Normal appearance. She is obese.  HENT:     Head: Normocephalic and atraumatic.  Eyes:     Extraocular Movements: Extraocular movements intact.     Pupils: Pupils are equal, round, and reactive to light.  Cardiovascular:     Rate and Rhythm: Normal rate and regular rhythm.     Pulses: Normal pulses.  Pulmonary:     Effort: Pulmonary effort is normal.     Breath sounds: Normal breath sounds.  Abdominal:     Palpations: Abdomen is soft.  Musculoskeletal:     Cervical back: Normal range of motion and neck supple.     Left knee: Effusion, bony tenderness and crepitus present. Decreased range of motion. Tenderness present over the medial joint line and lateral joint line. Abnormal alignment.  Neurological:     Mental Status: She is alert and oriented to person, place, and time.  Psychiatric:        Behavior: Behavior normal.     Vital signs in last 24 hours:    Labs:   Estimated body mass index is 36.12 kg/m as calculated from the following:   Height as of 03/01/24: 5' 5.5" (1.664 m).   Weight as  of 03/01/24: 100 kg.   Imaging Review Plain radiographs demonstrate severe degenerative joint disease of the left knee(s). The overall alignment ismild varus. The bone quality appears to be good for age and reported activity level.      Assessment/Plan:  End stage arthritis, left knee   The patient history, physical examination, clinical judgment of the provider and imaging studies are consistent with end stage degenerative joint disease of the left knee(s) and total knee arthroplasty is deemed medically necessary. The treatment options including medical management, injection therapy arthroscopy and arthroplasty were discussed at length. The risks and benefits of total knee arthroplasty were presented and reviewed.  The risks due to aseptic loosening, infection, stiffness, patella tracking problems, thromboembolic complications and other imponderables were discussed. The patient acknowledged the explanation, agreed to proceed with the plan and consent was signed. Patient is being admitted for inpatient treatment for surgery, pain control, PT, OT, prophylactic antibiotics, VTE prophylaxis, progressive ambulation and ADL's and discharge planning. The patient is planning to be discharged home with home health services

## 2024-03-08 NOTE — Telephone Encounter (Signed)
 Ortho [facility] pre-op call completed.

## 2024-03-08 NOTE — Care Plan (Signed)
 [facility] RNCM call to patient to discuss her upcoming Left total knee arthroplasty with Dr. Magnus Ivan on 03/09/24 at [facility]. She is agreeable to case management. She lives with her spouse, who will be assisting at home after discharge. She has a RW as well as a 3in1/BSC. No DME needed. Anticipate HHPT will be needed after a short hospital stay. Referral made to [facility] after choice provided. Reviewed all post op care instructions and questions answered. Will continue to follow for needs.

## 2024-03-09 ENCOUNTER — Other Ambulatory Visit: Payer: Self-pay

## 2024-03-09 ENCOUNTER — Observation Stay (HOSPITAL_COMMUNITY)

## 2024-03-09 ENCOUNTER — Ambulatory Visit (HOSPITAL_COMMUNITY): Admitting: Registered Nurse

## 2024-03-09 ENCOUNTER — Encounter (HOSPITAL_COMMUNITY)
Admission: AD | Disposition: A | Discharge status: Home Health | Payer: Self-pay | Source: Home / Self Care | Attending: Orthopaedic Surgery

## 2024-03-09 ENCOUNTER — Encounter (HOSPITAL_COMMUNITY): Payer: Self-pay | Admitting: Orthopaedic Surgery

## 2024-03-09 ENCOUNTER — Inpatient Hospital Stay (HOSPITAL_COMMUNITY)
Admission: AD | Admit: 2024-03-09 | Discharge: 2024-03-11 | DRG: 470 | Disposition: A | Discharge status: Home Health | Attending: Orthopaedic Surgery | Admitting: Orthopaedic Surgery

## 2024-03-09 DIAGNOSIS — E669 Obesity, unspecified: Secondary | ICD-10-CM | POA: Diagnosis present

## 2024-03-09 DIAGNOSIS — Z96651 Presence of right artificial knee joint: Secondary | ICD-10-CM | POA: Diagnosis present

## 2024-03-09 DIAGNOSIS — M1712 Unilateral primary osteoarthritis, left knee: Secondary | ICD-10-CM

## 2024-03-09 DIAGNOSIS — Z791 Long term (current) use of non-steroidal anti-inflammatories (NSAID): Secondary | ICD-10-CM

## 2024-03-09 DIAGNOSIS — Z833 Family history of diabetes mellitus: Secondary | ICD-10-CM

## 2024-03-09 DIAGNOSIS — E781 Pure hyperglyceridemia: Secondary | ICD-10-CM | POA: Diagnosis present

## 2024-03-09 DIAGNOSIS — Z87891 Personal history of nicotine dependence: Secondary | ICD-10-CM

## 2024-03-09 DIAGNOSIS — G4733 Obstructive sleep apnea (adult) (pediatric): Secondary | ICD-10-CM | POA: Diagnosis present

## 2024-03-09 DIAGNOSIS — F32A Depression, unspecified: Secondary | ICD-10-CM | POA: Diagnosis present

## 2024-03-09 DIAGNOSIS — Z96652 Presence of left artificial knee joint: Secondary | ICD-10-CM

## 2024-03-09 DIAGNOSIS — G8918 Other acute postprocedural pain: Secondary | ICD-10-CM | POA: Diagnosis not present

## 2024-03-09 DIAGNOSIS — Z79899 Other long term (current) drug therapy: Secondary | ICD-10-CM

## 2024-03-09 DIAGNOSIS — Z7982 Long term (current) use of aspirin: Secondary | ICD-10-CM

## 2024-03-09 DIAGNOSIS — Z803 Family history of malignant neoplasm of breast: Secondary | ICD-10-CM

## 2024-03-09 DIAGNOSIS — Z471 Aftercare following joint replacement surgery: Secondary | ICD-10-CM | POA: Diagnosis not present

## 2024-03-09 DIAGNOSIS — Z818 Family history of other mental and behavioral disorders: Secondary | ICD-10-CM

## 2024-03-09 DIAGNOSIS — Z6836 Body mass index (BMI) 36.0-36.9, adult: Secondary | ICD-10-CM

## 2024-03-09 DIAGNOSIS — Z96611 Presence of right artificial shoulder joint: Secondary | ICD-10-CM | POA: Diagnosis present

## 2024-03-09 DIAGNOSIS — Z8249 Family history of ischemic heart disease and other diseases of the circulatory system: Secondary | ICD-10-CM

## 2024-03-09 DIAGNOSIS — G2581 Restless legs syndrome: Secondary | ICD-10-CM | POA: Diagnosis present

## 2024-03-09 HISTORY — PX: TOTAL KNEE ARTHROPLASTY: SHX125

## 2024-03-09 SURGERY — ARTHROPLASTY, KNEE, TOTAL
Anesthesia: Spinal | Site: Knee | Laterality: Left

## 2024-03-09 MED ORDER — METHOCARBAMOL 1000 MG/10ML IJ SOLN: 500.0000 mg | Freq: Four times a day (QID) | INTRAMUSCULAR | Status: DC | PRN

## 2024-03-09 MED ORDER — POVIDONE-IODINE 10 % EX SWAB
2.0000 | Freq: Once | CUTANEOUS | Status: AC
  Administered 2024-03-09: 2 via TOPICAL

## 2024-03-09 MED ORDER — SODIUM CHLORIDE 0.9 % IV SOLN: 12.5000 mg | INTRAVENOUS | Status: DC | PRN

## 2024-03-09 MED ORDER — ROPIVACAINE HCL 5 MG/ML IJ SOLN
INTRAMUSCULAR | Status: DC | PRN
  Administered 2024-03-09: 20 mL via PERINEURAL

## 2024-03-09 MED ORDER — CHLORHEXIDINE GLUCONATE 0.12 % MT SOLN
OROMUCOSAL | Status: AC
  Administered 2024-03-09: 15 mL via OROMUCOSAL
  Filled 2024-03-09: qty 15

## 2024-03-09 MED ORDER — PHENOL 1.4 % MT LIQD: 1.0000 | OROMUCOSAL | Status: DC | PRN

## 2024-03-09 MED ORDER — FENTANYL CITRATE (PF) 250 MCG/5ML IJ SOLN
INTRAMUSCULAR | Status: AC
Start: 2024-03-09 — End: ?
  Filled 2024-03-09: qty 5

## 2024-03-09 MED ORDER — OXYCODONE HCL 5 MG PO TABS
10.0000 mg | ORAL_TABLET | ORAL | Status: DC | PRN
  Administered 2024-03-09 – 2024-03-10 (×5): 15 mg via ORAL
  Filled 2024-03-09 (×5): qty 3

## 2024-03-09 MED ORDER — ASPIRIN 81 MG PO CHEW
81.0000 mg | CHEWABLE_TABLET | Freq: Two times a day (BID) | ORAL | Status: DC
  Administered 2024-03-09 – 2024-03-11 (×4): 81 mg via ORAL
  Filled 2024-03-09 (×4): qty 1

## 2024-03-09 MED ORDER — TRANEXAMIC ACID-NACL 1000-0.7 MG/100ML-% IV SOLN
INTRAVENOUS | Status: AC
  Filled 2024-03-09: qty 100

## 2024-03-09 MED ORDER — MENTHOL 3 MG MT LOZG: 1.0000 | LOZENGE | OROMUCOSAL | Status: DC | PRN

## 2024-03-09 MED ORDER — METOCLOPRAMIDE HCL 5 MG/ML IJ SOLN: 5.0000 mg | Freq: Three times a day (TID) | INTRAMUSCULAR | Status: DC | PRN

## 2024-03-09 MED ORDER — CEFAZOLIN SODIUM-DEXTROSE 2-4 GM/100ML-% IV SOLN
2.0000 g | INTRAVENOUS | Status: AC
Start: 2024-03-09 — End: 2024-03-09
  Administered 2024-03-09: 2 g via INTRAVENOUS

## 2024-03-09 MED ORDER — OXYCODONE HCL 5 MG PO TABS: 5.0000 mg | ORAL_TABLET | Freq: Once | ORAL | Status: DC | PRN

## 2024-03-09 MED ORDER — HYDROMORPHONE HCL 1 MG/ML IJ SOLN
0.2500 mg | INTRAMUSCULAR | Status: DC | PRN
  Administered 2024-03-09 (×2): 0.5 mg via INTRAVENOUS

## 2024-03-09 MED ORDER — PHENYLEPHRINE HCL-NACL 20-0.9 MG/250ML-% IV SOLN
INTRAVENOUS | Status: DC | PRN
  Administered 2024-03-09: 30 ug/min via INTRAVENOUS

## 2024-03-09 MED ORDER — SODIUM CHLORIDE 0.9 % IR SOLN
Status: DC | PRN
  Administered 2024-03-09: 1000 mL

## 2024-03-09 MED ORDER — ACETAMINOPHEN 325 MG PO TABS: 325.0000 mg | ORAL_TABLET | Freq: Four times a day (QID) | ORAL | Status: DC | PRN

## 2024-03-09 MED ORDER — BUPIVACAINE-EPINEPHRINE (PF) 0.25% -1:200000 IJ SOLN
INTRAMUSCULAR | Status: AC
  Filled 2024-03-09: qty 30

## 2024-03-09 MED ORDER — TRANEXAMIC ACID-NACL 1000-0.7 MG/100ML-% IV SOLN
1000.0000 mg | INTRAVENOUS | Status: AC
  Administered 2024-03-09: 1000 mg via INTRAVENOUS

## 2024-03-09 MED ORDER — ONDANSETRON HCL 4 MG/2ML IJ SOLN: 4.0000 mg | Freq: Four times a day (QID) | INTRAMUSCULAR | Status: DC | PRN

## 2024-03-09 MED ORDER — SERTRALINE HCL 100 MG PO TABS
100.0000 mg | ORAL_TABLET | Freq: Every day | ORAL | Status: DC
  Administered 2024-03-10 – 2024-03-11 (×2): 100 mg via ORAL
  Filled 2024-03-09 (×2): qty 1

## 2024-03-09 MED ORDER — METOCLOPRAMIDE HCL 5 MG PO TABS: 5.0000 mg | ORAL_TABLET | Freq: Three times a day (TID) | ORAL | Status: DC | PRN

## 2024-03-09 MED ORDER — BUPIVACAINE-EPINEPHRINE 0.25% -1:200000 IJ SOLN
INTRAMUSCULAR | Status: DC | PRN
  Administered 2024-03-09: 30 mL

## 2024-03-09 MED ORDER — LACTATED RINGERS IV SOLN
INTRAVENOUS | Status: DC
Start: 2024-03-09 — End: 2024-03-09

## 2024-03-09 MED ORDER — OXYCODONE HCL 5 MG/5ML PO SOLN: 5.0000 mg | Freq: Once | ORAL | Status: DC | PRN

## 2024-03-09 MED ORDER — CEFAZOLIN SODIUM-DEXTROSE 2-4 GM/100ML-% IV SOLN
2.0000 g | Freq: Four times a day (QID) | INTRAVENOUS | Status: AC
  Administered 2024-03-09 (×2): 2 g via INTRAVENOUS
  Filled 2024-03-09 (×2): qty 100

## 2024-03-09 MED ORDER — HYDROMORPHONE HCL 1 MG/ML IJ SOLN
0.5000 mg | INTRAMUSCULAR | Status: DC | PRN
  Administered 2024-03-09: 0.5 mg via INTRAVENOUS
  Administered 2024-03-09 – 2024-03-10 (×3): 1 mg via INTRAVENOUS
  Filled 2024-03-09 (×4): qty 1

## 2024-03-09 MED ORDER — MIDAZOLAM HCL 2 MG/2ML IJ SOLN
INTRAMUSCULAR | Status: AC
  Administered 2024-03-09: 2 mg via INTRAVENOUS
  Filled 2024-03-09: qty 2

## 2024-03-09 MED ORDER — ZOLPIDEM TARTRATE 5 MG PO TABS
5.0000 mg | ORAL_TABLET | Freq: Every evening | ORAL | Status: DC | PRN
  Administered 2024-03-09 – 2024-03-10 (×2): 5 mg via ORAL
  Filled 2024-03-09 (×2): qty 1

## 2024-03-09 MED ORDER — STERILE WATER FOR IRRIGATION IR SOLN
Status: DC | PRN
  Administered 2024-03-09: 1000 mL

## 2024-03-09 MED ORDER — BUPIVACAINE IN DEXTROSE 0.75-8.25 % IT SOLN
INTRATHECAL | Status: DC | PRN
  Administered 2024-03-09: 2 mL via INTRATHECAL

## 2024-03-09 MED ORDER — MIDAZOLAM HCL 2 MG/2ML IJ SOLN: 2.0000 mg | Freq: Once | INTRAMUSCULAR | Status: AC

## 2024-03-09 MED ORDER — FERROUS SULFATE 325 (65 FE) MG PO TABS
650.0000 mg | ORAL_TABLET | Freq: Every day | ORAL | Status: DC
  Administered 2024-03-10 – 2024-03-11 (×2): 650 mg via ORAL
  Filled 2024-03-09 (×2): qty 2

## 2024-03-09 MED ORDER — PROPOFOL 500 MG/50ML IV EMUL
INTRAVENOUS | Status: DC | PRN
  Administered 2024-03-09: 75 ug/kg/min via INTRAVENOUS

## 2024-03-09 MED ORDER — ORAL CARE MOUTH RINSE: 15.0000 mL | Freq: Once | OROMUCOSAL | Status: AC

## 2024-03-09 MED ORDER — HYDROMORPHONE HCL 1 MG/ML IJ SOLN
INTRAMUSCULAR | Status: AC
  Filled 2024-03-09: qty 1

## 2024-03-09 MED ORDER — FENTANYL CITRATE (PF) 100 MCG/2ML IJ SOLN: 100.0000 ug | Freq: Once | INTRAMUSCULAR | Status: AC

## 2024-03-09 MED ORDER — FENTANYL CITRATE (PF) 100 MCG/2ML IJ SOLN
INTRAMUSCULAR | Status: AC
  Administered 2024-03-09: 100 ug via INTRAVENOUS
  Filled 2024-03-09: qty 2

## 2024-03-09 MED ORDER — CEFAZOLIN SODIUM-DEXTROSE 2-4 GM/100ML-% IV SOLN
INTRAVENOUS | Status: AC
  Filled 2024-03-09: qty 100

## 2024-03-09 MED ORDER — ALUM & MAG HYDROXIDE-SIMETH 200-200-20 MG/5ML PO SUSP: 30.0000 mL | ORAL | Status: DC | PRN

## 2024-03-09 MED ORDER — FENTANYL CITRATE (PF) 250 MCG/5ML IJ SOLN
INTRAMUSCULAR | Status: DC | PRN
  Administered 2024-03-09: 50 ug via INTRAVENOUS

## 2024-03-09 MED ORDER — MIDAZOLAM HCL 2 MG/2ML IJ SOLN
INTRAMUSCULAR | Status: DC | PRN
  Administered 2024-03-09 (×2): .5 mg via INTRAVENOUS

## 2024-03-09 MED ORDER — ONDANSETRON HCL 4 MG PO TABS: 4.0000 mg | ORAL_TABLET | Freq: Four times a day (QID) | ORAL | Status: DC | PRN

## 2024-03-09 MED ORDER — DOCUSATE SODIUM 100 MG PO CAPS
100.0000 mg | ORAL_CAPSULE | Freq: Two times a day (BID) | ORAL | Status: DC
  Administered 2024-03-09 – 2024-03-11 (×4): 100 mg via ORAL
  Filled 2024-03-09 (×4): qty 1

## 2024-03-09 MED ORDER — MAGNESIUM OXIDE -MG SUPPLEMENT 400 (240 MG) MG PO TABS
400.0000 mg | ORAL_TABLET | Freq: Every day | ORAL | Status: DC
  Administered 2024-03-09 – 2024-03-11 (×3): 400 mg via ORAL
  Filled 2024-03-09 (×3): qty 1

## 2024-03-09 MED ORDER — POLYETHYLENE GLYCOL 3350 17 G PO PACK: 17.0000 g | PACK | Freq: Every day | ORAL | Status: DC | PRN

## 2024-03-09 MED ORDER — ONDANSETRON HCL 4 MG/2ML IJ SOLN
INTRAMUSCULAR | Status: DC | PRN
Start: 2024-03-09 — End: 2024-03-09
  Administered 2024-03-09: 4 mg via INTRAVENOUS

## 2024-03-09 MED ORDER — VITAMIN D 25 MCG (1000 UNIT) PO TABS
2000.0000 [IU] | ORAL_TABLET | Freq: Every day | ORAL | Status: DC
  Administered 2024-03-09 – 2024-03-11 (×3): 2000 [IU] via ORAL
  Filled 2024-03-09 (×3): qty 2

## 2024-03-09 MED ORDER — DIPHENHYDRAMINE HCL 12.5 MG/5ML PO ELIX: 12.5000 mg | ORAL_SOLUTION | ORAL | Status: DC | PRN

## 2024-03-09 MED ORDER — METHOCARBAMOL 500 MG PO TABS
500.0000 mg | ORAL_TABLET | Freq: Four times a day (QID) | ORAL | Status: DC | PRN
  Administered 2024-03-09 (×2): 500 mg via ORAL
  Filled 2024-03-09 (×4): qty 1

## 2024-03-09 MED ORDER — PANTOPRAZOLE SODIUM 40 MG PO TBEC
40.0000 mg | DELAYED_RELEASE_TABLET | Freq: Every day | ORAL | Status: DC
  Administered 2024-03-09 – 2024-03-11 (×3): 40 mg via ORAL
  Filled 2024-03-09 (×3): qty 1

## 2024-03-09 MED ORDER — 0.9 % SODIUM CHLORIDE (POUR BTL) OPTIME
TOPICAL | Status: DC | PRN
  Administered 2024-03-09: 1000 mL

## 2024-03-09 MED ORDER — OXYCODONE HCL 5 MG PO TABS
5.0000 mg | ORAL_TABLET | ORAL | Status: DC | PRN
  Administered 2024-03-09 – 2024-03-11 (×4): 10 mg via ORAL
  Filled 2024-03-09 (×5): qty 2

## 2024-03-09 MED ORDER — CHLORHEXIDINE GLUCONATE 0.12 % MT SOLN: 15.0000 mL | Freq: Once | OROMUCOSAL | Status: AC

## 2024-03-09 MED ORDER — SODIUM CHLORIDE 0.9 % IV SOLN: INTRAVENOUS | Status: AC

## 2024-03-09 MED ORDER — DEXAMETHASONE SODIUM PHOSPHATE 10 MG/ML IJ SOLN
INTRAMUSCULAR | Status: DC | PRN
  Administered 2024-03-09: 5 mg via INTRAVENOUS

## 2024-03-09 MED ORDER — MIDAZOLAM HCL 2 MG/2ML IJ SOLN
INTRAMUSCULAR | Status: AC
  Filled 2024-03-09: qty 2

## 2024-03-09 SURGICAL SUPPLY — 60 items
BAG COUNTER SPONGE SURGICOUNT (BAG) ×1 IMPLANT
BANDAGE ESMARK 6X9 LF (GAUZE/BANDAGES/DRESSINGS) ×1 IMPLANT
BLADE SAG 18X100X1.27 (BLADE) ×1 IMPLANT
BNDG COHESIVE 4X5 TAN STRL LF (GAUZE/BANDAGES/DRESSINGS) IMPLANT
BNDG ELASTIC 6INX 5YD STR LF (GAUZE/BANDAGES/DRESSINGS) ×2 IMPLANT
BNDG ESMARK 6X9 LF (GAUZE/BANDAGES/DRESSINGS) ×1 IMPLANT
BOWL SMART MIX CTS (DISPOSABLE) IMPLANT
COMP FEM KNEE STD PS 7 LT (Joint) ×1 IMPLANT
COMP PATELLA 3 PEG 29X9 (Joint) ×1 IMPLANT
COMPONENT FEM KNEE STD PS 7 LT (Joint) IMPLANT
COMPONENT PATELLA 3 PEG 29X9 (Joint) IMPLANT
COOLER ICEMAN CLASSIC (MISCELLANEOUS) ×1 IMPLANT
COVER SURGICAL LIGHT HANDLE (MISCELLANEOUS) ×1 IMPLANT
CUFF TOURN SGL QUICK 42 (TOURNIQUET CUFF) IMPLANT
CUFF TRNQT CYL 34X4.125X (TOURNIQUET CUFF) ×1 IMPLANT
DRAPE EXTREMITY T 121X128X90 (DISPOSABLE) ×1 IMPLANT
DRAPE HALF SHEET 40X57 (DRAPES) ×1 IMPLANT
DRAPE U-SHAPE 47X51 STRL (DRAPES) ×1 IMPLANT
DURAPREP 26ML APPLICATOR (WOUND CARE) ×1 IMPLANT
ELECT CAUTERY BLADE 6.4 (BLADE) ×1 IMPLANT
ELECT REM PT RETURN 9FT ADLT (ELECTROSURGICAL) ×1 IMPLANT
ELECTRODE REM PT RTRN 9FT ADLT (ELECTROSURGICAL) ×1 IMPLANT
FACESHIELD WRAPAROUND (MASK) ×3 IMPLANT
FACESHIELD WRAPAROUND OR TEAM (MASK) ×2 IMPLANT
GAUZE PAD ABD 8X10 STRL (GAUZE/BANDAGES/DRESSINGS) ×1 IMPLANT
GAUZE SPONGE 4X4 12PLY STRL (GAUZE/BANDAGES/DRESSINGS) ×1 IMPLANT
GAUZE XEROFORM 1X8 LF (GAUZE/BANDAGES/DRESSINGS) ×1 IMPLANT
GLOVE BIOGEL PI IND STRL 8 (GLOVE) ×2 IMPLANT
GLOVE ORTHO TXT STRL SZ7.5 (GLOVE) ×1 IMPLANT
GLOVE SURG ORTHO 8.0 STRL STRW (GLOVE) ×1 IMPLANT
GOWN STRL REUS W/ TWL LRG LVL3 (GOWN DISPOSABLE) IMPLANT
GOWN STRL REUS W/ TWL XL LVL3 (GOWN DISPOSABLE) ×2 IMPLANT
IMMOBILIZER KNEE 22 UNIV (SOFTGOODS) ×1 IMPLANT
IV NS 1000ML BAXH (IV SOLUTION) ×1 IMPLANT
KIT BASIN OR (CUSTOM PROCEDURE TRAY) ×1 IMPLANT
KIT TURNOVER KIT B (KITS) ×1 IMPLANT
LINER TIB ASF PS CD/6-7 16 LT (Liner) IMPLANT
MANIFOLD NEPTUNE II (INSTRUMENTS) ×1 IMPLANT
NDL 18GX1X1/2 (RX/OR ONLY) (NEEDLE) IMPLANT
NEEDLE 18GX1X1/2 (RX/OR ONLY) (NEEDLE) ×1 IMPLANT
NS IRRIG 1000ML POUR BTL (IV SOLUTION) ×1 IMPLANT
PACK TOTAL JOINT (CUSTOM PROCEDURE TRAY) ×1 IMPLANT
PAD ARMBOARD POSITIONER FOAM (MISCELLANEOUS) ×1 IMPLANT
PAD COLD SHLDR WRAP-ON (PAD) ×1 IMPLANT
PADDING CAST COTTON 6X4 STRL (CAST SUPPLIES) ×1 IMPLANT
PIN DRILL HDLS TROCAR 75 4PK (PIN) IMPLANT
SCREW FEMALE HEX FIX 25X2.5 (ORTHOPEDIC DISPOSABLE SUPPLIES) IMPLANT
SET HNDPC FAN SPRY TIP SCT (DISPOSABLE) ×1 IMPLANT
SET PAD KNEE POSITIONER (MISCELLANEOUS) ×1 IMPLANT
STAPLER VISISTAT 35W (STAPLE) ×1 IMPLANT
STEM TIB PS KNEE D 0D LT (Stem) IMPLANT
SUCTION TUBE FRAZIER 10FR DISP (SUCTIONS) ×1 IMPLANT
SUT VIC AB 0 CT1 27XBRD ANBCTR (SUTURE) ×1 IMPLANT
SUT VIC AB 1 CT1 27XBRD ANBCTR (SUTURE) ×2 IMPLANT
SUT VIC AB 2-0 CT1 TAPERPNT 27 (SUTURE) ×2 IMPLANT
SYR 50ML LL SCALE MARK (SYRINGE) IMPLANT
TOWEL GREEN STERILE (TOWEL DISPOSABLE) ×1 IMPLANT
TOWEL GREEN STERILE FF (TOWEL DISPOSABLE) ×1 IMPLANT
TRAY CATH INTERMITTENT SS 16FR (CATHETERS) IMPLANT
TRAY FOLEY W/BAG SLVR 16FR ST (SET/KITS/TRAYS/PACK) IMPLANT

## 2024-03-09 NOTE — Transfer of Care (Signed)
 Immediate Anesthesia Transfer of Care Note  Patient: Beth [name]  Procedure(s) Performed: ARTHROPLASTY, KNEE, TOTAL (Left: Knee)  Patient Location: PACU  Anesthesia Type:Spinal  Level of Consciousness: awake  Airway & Oxygen Therapy: Patient Spontanous Breathing  Post-op Assessment: Report given to RN and Post -op Vital signs reviewed and stable  Post vital signs: Reviewed and stable  Last Vitals:  Vitals Value Taken Time  BP 115/58 (74)   Temp    Pulse 88   Resp 22   SpO2 95     Last Pain:  Vitals:   03/09/24 1130  TempSrc:   PainSc: 0-No pain         Complications: No notable events documented.

## 2024-03-09 NOTE — Anesthesia Procedure Notes (Signed)
 Spinal  Patient location during procedure: OR Start time: 03/09/2024 11:46 AM End time: 03/09/2024 11:51 AM Reason for block: surgical anesthesia Staffing Performed: anesthesiologist  Anesthesiologist: Lowella Curb, MD Performed by: Lowella Curb, MD Authorized by: Lowella Curb, MD   Preanesthetic Checklist Completed: patient identified, IV checked, site marked, risks and benefits discussed, surgical consent, monitors and equipment checked, pre-op evaluation and timeout performed Spinal Block Patient position: sitting Prep: DuraPrep Patient monitoring: heart rate, cardiac monitor, continuous pulse ox and blood pressure Approach: midline Location: L3-4 Injection technique: single-shot Needle Needle type: Sprotte  Needle gauge: 24 G Needle length: 9 cm Assessment Sensory level: T4 Events: CSF return

## 2024-03-09 NOTE — Anesthesia Preprocedure Evaluation (Signed)
 Anesthesia Evaluation  Patient identified by MRN, date of birth, ID band Patient awake    Reviewed: Allergy & Precautions, NPO status , Patient's Chart, lab work & pertinent test results  Airway Mallampati: II  TM Distance: >3 FB Neck ROM: Full    Dental  (+) Dental Advisory Given   Pulmonary sleep apnea , former smoker   breath sounds clear to auscultation       Cardiovascular negative cardio ROS  Rhythm:Regular Rate:Normal     Neuro/Psych   Anxiety Depression    negative neurological ROS     GI/Hepatic negative GI ROS, Neg liver ROS,,,  Endo/Other  negative endocrine ROS    Renal/GU negative Renal ROS     Musculoskeletal  (+) Arthritis , Osteoarthritis,    Abdominal  (+) + obese  Peds  Hematology negative hematology ROS (+)   Anesthesia Other Findings   Reproductive/Obstetrics                             Anesthesia Physical Anesthesia Plan  ASA: 2  Anesthesia Plan: Spinal   Post-op Pain Management: Regional block*   Induction: Intravenous  PONV Risk Score and Plan: 2 and Treatment may vary due to age or medical condition and Propofol infusion  Airway Management Planned: Simple Face Mask  Additional Equipment: None  Intra-op Plan:   Post-operative Plan: Possible Post-op intubation/ventilation  Informed Consent: I have reviewed the patients History and Physical, chart, labs and discussed the procedure including the risks, benefits and alternatives for the proposed anesthesia with the patient or authorized representative who has indicated his/her understanding and acceptance.     Dental advisory given  Plan Discussed with: CRNA  Anesthesia Plan Comments:         Anesthesia Quick Evaluation

## 2024-03-09 NOTE — Interval H&P Note (Signed)
 History and Physical Interval Note: The patient understands that she is here today for a left knee replacement to treat her significant left knee pain and arthritis.  There has been no acute or durable change in her medical status.  The risks and benefits of surgery have been discussed in detail and informed consent has been obtained.  The left operative knee has been marked.  03/09/2024 10:19 AM  Beth [name]  has presented today for surgery, with the diagnosis of osteoarthritis left knee.  The various methods of treatment have been discussed with the patient and family. After consideration of risks, benefits and other options for treatment, the patient has consented to  Procedure(s): ARTHROPLASTY, KNEE, TOTAL (Left) as a surgical intervention.  The patient's history has been reviewed, patient examined, no change in status, stable for surgery.  I have reviewed the patient's chart and labs.  Questions were answered to the patient's satisfaction.     Kathryne Hitch

## 2024-03-09 NOTE — Anesthesia Procedure Notes (Signed)
 Anesthesia Regional Block: Adductor canal block   Pre-Anesthetic Checklist: , timeout performed,  Correct Patient, Correct Site, Correct Laterality,  Correct Procedure, Correct Position, site marked,  Risks and benefits discussed,  Surgical consent,  Pre-op evaluation,  At surgeon's request and post-op pain management  Laterality: Left  Prep: chloraprep       Needles:  Injection technique: Single-shot  Needle Type: Stimiplex     Needle Length: 9cm  Needle Gauge: 21     Additional Needles:   Procedures:,,,, ultrasound used (permanent image in chart),,    Narrative:  Start time: 03/09/2024 11:33 AM End time: 03/09/2024 11:38 AM Injection made incrementally with aspirations every 5 mL.  Performed by: Personally  Anesthesiologist: Lowella Curb, MD

## 2024-03-09 NOTE — Anesthesia Postprocedure Evaluation (Signed)
 Anesthesia Post Note  Patient: Beth [name]  Procedure(s) Performed: ARTHROPLASTY, KNEE, TOTAL (Left: Knee)     Patient location during evaluation: PACU Anesthesia Type: Spinal Level of consciousness: awake and alert Pain management: pain level controlled Vital Signs Assessment: post-procedure vital signs reviewed and stable Respiratory status: spontaneous breathing, nonlabored ventilation and respiratory function stable Cardiovascular status: blood pressure returned to baseline and stable Postop Assessment: no apparent nausea or vomiting Anesthetic complications: no   No notable events documented.  Last Vitals:  Vitals:   03/09/24 1456 03/09/24 1536  BP: 137/71 (!) 143/81  Pulse: 68 72  Resp: 18 17  Temp: (!) 36.4 C (!) 36.3 C  SpO2: 96% 99%    Last Pain:  Vitals:   03/09/24 1500  TempSrc:   PainSc: 4                  Lowella Curb

## 2024-03-09 NOTE — Discharge Instructions (Signed)

## 2024-03-09 NOTE — Evaluation (Signed)
 Physical Therapy Evaluation Patient Details Name: Beth [name] MRN: [id number] DOB: 04/25/1956 Today's Date: 03/09/2024  History of Present Illness  68 y.o. female [facility] for elective L TKA. PMH includes OSA, insomnia, depression, RLS, OA, anxiety.  Clinical Impression  Pt presents to PT with deficits in functional mobility, gait, balance, strength, power, ROM. Pt is able to stand with support of RW, however once standing pt with realization of continued effects of nerve block resulting in impaired control of BLE musculature. Pt is able to march at the bedside but demonstrates increased sway and poor control of hip musculature. PT defers ambulation away from the bedside to reduce risk for falls. PT provides education on TKR exercise protocol. PT will follow up tomorrow for progression of gait and mobility training.        If plan is discharge home, recommend the following: A little help with walking and/or transfers;A little help with bathing/dressing/bathroom;Assistance with cooking/housework;Assist for transportation;Help with stairs or ramp for entrance   Can travel by private vehicle        Equipment Recommendations None recommended by PT  Recommendations for Other Services       Functional Status Assessment Patient has had a recent decline in their functional status and demonstrates the ability to make significant improvements in function in a reasonable and predictable amount of time.     Precautions / Restrictions Precautions Precautions: Fall;Knee Precaution Booklet Issued: Yes (comment) Recall of Precautions/Restrictions: Intact Required Braces or Orthoses: Knee Immobilizer - Left Restrictions Weight Bearing Restrictions Per Provider Order: Yes LLE Weight Bearing Per Provider Order: Weight bearing as tolerated      Mobility  Bed Mobility Overal bed mobility: Needs Assistance Bed Mobility: Supine to Sit, Sit to Supine     Supine to sit:  Supervision, HOB elevated Sit to supine: Contact guard assist        Transfers Overall transfer level: Needs assistance Equipment used: Rolling walker (2 wheels) Transfers: Sit to/from Stand Sit to Stand: Contact guard assist                Ambulation/Gait Ambulation/Gait assistance: Min assist Gait Distance (Feet): 1 Feet Assistive device: Rolling walker (2 wheels) Gait Pattern/deviations: Step-to pattern (instability at hips) Gait velocity: reduced Gait velocity interpretation: <1.31 ft/sec, indicative of household ambulator Pre-gait activities: marching in place at bedside for ~8 steps General Gait Details: pt marching in place and noted to have impaired control at hip musculature, resulting in increased lateral and AP sway, ambulation away from bedside deferred due to impaired sensation and muscle control.  Stairs            Wheelchair Mobility     Tilt Bed    Modified Rankin (Stroke Patients Only)       Balance Overall balance assessment: Needs assistance Sitting-balance support: No upper extremity supported, Feet supported Sitting balance-Leahy Scale: Good     Standing balance support: Bilateral upper extremity supported, Reliant on assistive device for balance Standing balance-Leahy Scale: Poor                               Pertinent Vitals/Pain Pain Assessment Pain Assessment: Faces Faces Pain Scale: Hurts even more Pain Location: L knee Pain Descriptors / Indicators: Grimacing Pain Intervention(s): Monitored during session    Home Living Family/patient expects to be discharged to:: Private residence Living Arrangements: Spouse/significant other Available Help at Discharge: Family;Available 24 hours/day Type of  Home: House Home Access: Stairs to enter Entrance Stairs-Rails: None Entrance Stairs-Number of Steps: 3   Home Layout: One level Home Equipment: Agricultural consultant (2 wheels);Cane - single point;Toilet riser       Prior Function Prior Level of Function : Independent/Modified Independent;Driving             Mobility Comments: retired from [employer], independent without DME       Extremity/Trunk Assessment   Upper Extremity Assessment Upper Extremity Assessment: Overall WFL for tasks assessed    Lower Extremity Assessment Lower Extremity Assessment: LLE deficits/detail;RLE deficits/detail RLE Sensation: decreased light touch;decreased proprioception LLE Deficits / Details: pt with generalized weakness and ROM deficits as anticipated on POD 0. Pt with residual effects of nerve block, resulting in impaired sensation in BLE LLE Sensation: decreased light touch    Cervical / Trunk Assessment Cervical / Trunk Assessment: Normal  Communication   Communication Communication: No apparent difficulties    Cognition Arousal: Alert Behavior During Therapy: WFL for tasks assessed/performed   PT - Cognitive impairments: No apparent impairments                         Following commands: Intact       Cueing Cueing Techniques: Verbal cues     General Comments General comments (skin integrity, edema, etc.): VSS on RA, pt reports very mild numbness in LLE pre-mobility. Once standing pt with realization of more significant sensation impairment in BLE, likely from continued effects of nerve block    Exercises Other Exercises Other Exercises: PT provides education on TKR exercise packet   Assessment/Plan    PT Assessment Patient needs continued PT services  PT Problem List Decreased range of motion;Decreased strength;Decreased activity tolerance;Decreased mobility;Decreased balance;Decreased knowledge of use of DME;Pain;Impaired sensation       PT Treatment Interventions DME instruction;Gait training;Stair training;Functional mobility training;Therapeutic activities;Therapeutic exercise;Balance training;Neuromuscular re-education;Patient/family education    PT Goals (Current goals  can be found in the Care Plan section)  Acute Rehab PT Goals Patient Stated Goal: to return to independence PT Goal Formulation: With patient Time For Goal Achievement: 03/13/24 Potential to Achieve Goals: Good    Frequency 7X/week     Co-evaluation               AM-PAC PT "6 Clicks" Mobility  Outcome Measure Help needed turning from your back to your side while in a flat bed without using bedrails?: A Little Help needed moving from lying on your back to sitting on the side of a flat bed without using bedrails?: A Little Help needed moving to and from a bed to a chair (including a wheelchair)?: A Little Help needed standing up from a chair using your arms (e.g., wheelchair or bedside chair)?: A Little Help needed to walk in hospital room?: Total Help needed climbing 3-5 steps with a railing? : Total 6 Click Score: 14    End of Session Equipment Utilized During Treatment: Gait belt Activity Tolerance: Treatment limited secondary to medical complications (Comment) (continued effects of nerve block) Patient left: in bed;with call bell/phone within reach;with bed alarm set Nurse Communication: Mobility status PT Visit Diagnosis: Other abnormalities of gait and mobility (R26.89);Muscle weakness (generalized) (M62.81);Other symptoms and signs involving the nervous system (R29.898)    Time: 1610-9604 PT Time Calculation (min) (ACUTE ONLY): 26 min   Charges:   PT Evaluation $PT Eval Low Complexity: 1 Low   PT General Charges $$ ACUTE PT VISIT: 1 Visit  Arlyss Gandy, PT, DPT Acute Rehabilitation Office [phone]   Arlyss Gandy 03/09/2024, 4:43 PM

## 2024-03-09 NOTE — Op Note (Signed)
 Operative Note  Date of operation: 03/09/2024 Preoperative diagnosis: Left knee primary osteoarthritis Postoperative diagnosis: Same  Procedure: Left press-fit total knee arthroplasty  Implants: Biomet/Zimmer persona press-fit knee system Implant Name Type Inv. Item Serial No. Manufacturer Lot No. LRB No. Used Action  COMP PATELLA 3 PEG 29X9 - [id number] Joint COMP PATELLA 3 PEG 29X9  ZIMMER RECON(ORTH,TRAU,BIO,SG) [id number] Left 1 Implanted  LINER TIB ASF PS CD/6-7 16 LT - [id number] Liner LINER TIB ASF PS CD/6-7 16 LT  ZIMMER RECON(ORTH,TRAU,BIO,SG) [id number] Left 1 Implanted  COMP FEM KNEE STD PS 7 LT - [id number] Joint COMP FEM KNEE STD PS 7 LT  ZIMMER RECON(ORTH,TRAU,BIO,SG) [id number] Left 1 Implanted  STEM TIB PS KNEE D 0D LT - [id number] Stem STEM TIB PS KNEE D 0D LT  ZIMMER RECON(ORTH,TRAU,BIO,SG) [id number] Left 1 Implanted   Surgeon: Vanita Panda. Magnus Ivan, MD Assistant: Rexene Edison, PA-C  Anesthesia: #1 left lower extremity adductor canal block, #2 spinal, #3 local Tourniquet time: 51 minutes EBL: Less than 100 cc Antibiotics: IV Ancef Complications: None  Indications: The patient is a 68 year old female with debilitating arthritis involving her left knee.  This has been well-documented from clinical exam findings and x-ray findings.  She has tried and failed conservative treatment for over a year now with this left knee.  She does have a remote history of a right total knee arthroplasty done many years ago.  That has done well.  At this point her left knee pain is daily and it is detrimentally affecting her mobility, her quality of life and her actives daily living and she wishes to proceed with a knee replacement left side and we agree as well.  We did discuss the risks of acute blood loss anemia, nerve and vessel injury, fracture, infection, DVT, implant failure and wound healing issues.  She understands that our goals are hopefully decreased pain, improved mobility and improved  quality of life.  Procedure description: After informed consent was obtained and the appropriate left knee was marked, the patient had an adductor canal block of the left lower extremity performed in the holding room by anesthesia.  She was then brought to the operating room and set up on the operating table where spinal anesthesia was obtained.  She was laid in supine position on the operating table and a Foley catheter was placed.  A nonsterile tractors placed around her upper left thigh and her left thigh, knee, leg and ankle were prepped and draped with DuraPrep and sterile drapes including a sterile stockinette.  A timeout was called and she was identified as the correct patient the correct left knee.  An Esmarch was then used to wrap out the leg and the tourniquet was plated 300 mm of pressure.  With a direct midline incision made over the patella with the knee extended we dissected down to the knee joint and carried out a medial parapatellar arthrotomy.  We found a very large joint effusion.  With the knee in a flexed position we found significant cartilage loss throughout the knee.  We removed osteophytes from all 3 compartments as well as remnants of the ACL and medial lateral meniscus.  We then used an extramedullary cutting guide for making her proximal tibia cut correction for varus and valgus and a 7 degree slope.  We made this cut to take 2 mm off the low side and it was a very thin cut so we backed it down for more millimeters.  We then used a intramedullary based cutting guide  for distal femur cut setting this to the notch of the femur and for a left knee at 5 degrees externally rotated and a 10 mm distal femoral cut.  We brought the knee back down to full extension and it took a 12 mm extension block to achieve full extension.  We then went back to the femur and put a femoral sizing guide based on the epicondylar axis.  Based on this we chose a size 7 femur.  We put a 4-in-1 cutting block for a  size 7 femur and made our anterior and posterior cuts followed our chamfer cuts.  We then went back to the tibia and chose a size D left tibial tray for coverage over the tibial plateau setting the rotation of the tibial tubercle and the femur.  We then did our keel punch and drill hole off of this.  We did find excellent quality hard bone.  We felt comfortable with proceeding with press-fit implants based off of that.  We then trialed our size D left tibia combined with our size 7 left CR standard femur.  We trialed up to a 16 mm thickness left medial congruent polythene insert we are pleased the range of motion and stability without answered.  We then made a patella cut and drilled 3 holes for a size 29 press-fit patella button.  Again with all trial instrumentation of the knee we are pleased with range of motion and stability.  We then removed all trial instrumentation of the knee and irrigated the knee with normal saline solution.  Next we placed Marcaine with epinephrine around the arthrotomy.  With the knee in a flexed position we then placed our Biomet/Zimmer press-fit persona tibial tray for a left knee size D followed by press fitting our size 7 left CR standard femur.  We placed our 16 mm thickness medial congruent polythene insert and press-fit our size 29 patella button.  Final range of motion of the knee and stability felt good.  We then let the tourniquet down and hemostasis was obtained with electrocautery.  The arthrotomy was then closed with interrupted #1 Vicryl suture followed by 0 Vicryl goes deep tissue and 2-0 Vicryl to close subcutaneous tissue.  The skin was closed staples.  Well-padded sterile dressing was applied.  The patient was taken the recovery room in stable condition.  Rexene Edison, PA-C did assist during the entire case and beginning to end and his assistance was crucial and medically necessary for soft tissue management and retraction, helping guide implant placement and a layered  closure of the wound.

## 2024-03-10 ENCOUNTER — Encounter (HOSPITAL_COMMUNITY): Payer: Self-pay | Admitting: Orthopaedic Surgery

## 2024-03-10 DIAGNOSIS — Z833 Family history of diabetes mellitus: Secondary | ICD-10-CM | POA: Diagnosis not present

## 2024-03-10 DIAGNOSIS — Z791 Long term (current) use of non-steroidal anti-inflammatories (NSAID): Secondary | ICD-10-CM | POA: Diagnosis not present

## 2024-03-10 DIAGNOSIS — F32A Depression, unspecified: Secondary | ICD-10-CM | POA: Diagnosis not present

## 2024-03-10 DIAGNOSIS — G4733 Obstructive sleep apnea (adult) (pediatric): Secondary | ICD-10-CM | POA: Diagnosis not present

## 2024-03-10 DIAGNOSIS — Z79899 Other long term (current) drug therapy: Secondary | ICD-10-CM | POA: Diagnosis not present

## 2024-03-10 DIAGNOSIS — Z7982 Long term (current) use of aspirin: Secondary | ICD-10-CM | POA: Diagnosis not present

## 2024-03-10 DIAGNOSIS — E781 Pure hyperglyceridemia: Secondary | ICD-10-CM | POA: Diagnosis not present

## 2024-03-10 DIAGNOSIS — Z818 Family history of other mental and behavioral disorders: Secondary | ICD-10-CM | POA: Diagnosis not present

## 2024-03-10 DIAGNOSIS — Z96611 Presence of right artificial shoulder joint: Secondary | ICD-10-CM | POA: Diagnosis not present

## 2024-03-10 DIAGNOSIS — Z8249 Family history of ischemic heart disease and other diseases of the circulatory system: Secondary | ICD-10-CM | POA: Diagnosis not present

## 2024-03-10 DIAGNOSIS — Z87891 Personal history of nicotine dependence: Secondary | ICD-10-CM | POA: Diagnosis not present

## 2024-03-10 DIAGNOSIS — Z6836 Body mass index (BMI) 36.0-36.9, adult: Secondary | ICD-10-CM | POA: Diagnosis not present

## 2024-03-10 DIAGNOSIS — Z96651 Presence of right artificial knee joint: Secondary | ICD-10-CM | POA: Diagnosis not present

## 2024-03-10 DIAGNOSIS — Z803 Family history of malignant neoplasm of breast: Secondary | ICD-10-CM | POA: Diagnosis not present

## 2024-03-10 DIAGNOSIS — E669 Obesity, unspecified: Secondary | ICD-10-CM | POA: Diagnosis not present

## 2024-03-10 DIAGNOSIS — M1712 Unilateral primary osteoarthritis, left knee: Secondary | ICD-10-CM | POA: Diagnosis not present

## 2024-03-10 DIAGNOSIS — G2581 Restless legs syndrome: Secondary | ICD-10-CM | POA: Diagnosis not present

## 2024-03-10 LAB — CBC
HCT: 36.6 % (ref 36.0–46.0)
Hemoglobin: 12.3 g/dL (ref 12.0–15.0)
MCH: 30.5 pg (ref 26.0–34.0)
MCHC: 33.6 g/dL (ref 30.0–36.0)
MCV: 90.8 fL (ref 80.0–100.0)
Platelets: 261 10*3/uL (ref 150–400)
RBC: 4.03 MIL/uL (ref 3.87–5.11)
RDW: 12.6 % (ref 11.5–15.5)
WBC: 18.3 10*3/uL — ABNORMAL HIGH (ref 4.0–10.5)
nRBC: 0 % (ref 0.0–0.2)

## 2024-03-10 LAB — BASIC METABOLIC PANEL WITH GFR
Anion gap: 11 (ref 5–15)
BUN: 16 mg/dL (ref 8–23)
CO2: 22 mmol/L (ref 22–32)
Calcium: 8.7 mg/dL — ABNORMAL LOW (ref 8.9–10.3)
Chloride: 101 mmol/L (ref 98–111)
Creatinine, Ser: 0.86 mg/dL (ref 0.44–1.00)
GFR, Estimated: >60 mL/min (ref >60)
Glucose, Bld: 132 mg/dL — ABNORMAL HIGH (ref 70–99)
Potassium: 4.1 mmol/L (ref 3.5–5.1)
Sodium: 134 mmol/L — ABNORMAL LOW (ref 135–145)

## 2024-03-10 MED ORDER — KETOROLAC TROMETHAMINE 15 MG/ML IJ SOLN
7.5000 mg | Freq: Four times a day (QID) | INTRAMUSCULAR | Status: AC
  Administered 2024-03-10 (×3): 7.5 mg via INTRAVENOUS
  Filled 2024-03-10 (×3): qty 1

## 2024-03-10 NOTE — Progress Notes (Signed)
 Physical Therapy Treatment Patient Details Name: Beth [name] MRN: [id number] DOB: 05/12/1956 Today's Date: 03/10/2024   History of Present Illness 68 y.o. female presents to [facility] hospital on 03/09/2024 for elective L TKA. PMH includes OSA, insomnia, depression, RLS, OA, anxiety.    PT Comments  Pt tolerates treatment well, reporting much improved pain control. Pt is able to ambulate for increased distances and progress to stair training. PT encourages continued frequent mobilization along with HEP performance. PT will follow up tomorrow for further mobilization prior to discharge.    If plan is discharge home, recommend the following: A little help with walking and/or transfers;A little help with bathing/dressing/bathroom;Assistance with cooking/housework;Assist for transportation;Help with stairs or ramp for entrance   Can travel by private vehicle        Equipment Recommendations  None recommended by PT    Recommendations for Other Services       Precautions / Restrictions Precautions Precautions: Fall;Knee Precaution Booklet Issued: Yes (comment) Recall of Precautions/Restrictions: Intact Restrictions Weight Bearing Restrictions Per Provider Order: Yes LLE Weight Bearing Per Provider Order: Weight bearing as tolerated     Mobility  Bed Mobility Overal bed mobility: Needs Assistance Bed Mobility: Supine to Sit     Supine to sit: Supervision, HOB elevated     General bed mobility comments: assistance for LLE mobility    Transfers Overall transfer level: Needs assistance Equipment used: Rolling walker (2 wheels) Transfers: Sit to/from Stand Sit to Stand: Contact guard assist                Ambulation/Gait Ambulation/Gait assistance: Contact guard assist Gait Distance (Feet): 80 Feet Assistive device: Rolling walker (2 wheels) Gait Pattern/deviations: Step-to pattern Gait velocity: reduced Gait velocity interpretation: <1.8 ft/sec, indicate of risk for  recurrent falls   General Gait Details: slowed step-to gait, increasing step length with progressive ambulation   Stairs Stairs: Yes Stairs assistance: Contact guard assist Stair Management: Two rails, Forwards Number of Stairs: 2     Wheelchair Mobility     Tilt Bed    Modified Rankin (Stroke Patients Only)       Balance Overall balance assessment: Needs assistance Sitting-balance support: No upper extremity supported, Feet supported Sitting balance-Leahy Scale: Good     Standing balance support: Bilateral upper extremity supported, Reliant on assistive device for balance Standing balance-Leahy Scale: Poor                              Communication Communication Communication: No apparent difficulties  Cognition Arousal: Alert Behavior During Therapy: WFL for tasks assessed/performed   PT - Cognitive impairments: No apparent impairments                         Following commands: Intact      Cueing Cueing Techniques: Verbal cues  Exercises Total Joint Exercises Goniometric ROM: -4 degrees active extension, 60 degrees active assist knee flexion Other Exercises Other Exercises: PT encourages performance of quad sets and heels slides during the day between PT sessions    General Comments General comments (skin integrity, edema, etc.): VSS on RA      Pertinent Vitals/Pain Pain Assessment Pain Assessment: Faces Pain Score: 4  Faces Pain Scale: Hurts little more Pain Location: L knee Pain Descriptors / Indicators: Grimacing Pain Intervention(s): Monitored during session    Home Living  Prior Function            PT Goals (current goals can now be found in the care plan section) Acute Rehab PT Goals Patient Stated Goal: to return to independence Progress towards PT goals: Progressing toward goals    Frequency    7X/week      PT Plan      Co-evaluation              AM-PAC PT  "6 Clicks" Mobility   Outcome Measure  Help needed turning from your back to your side while in a flat bed without using bedrails?: A Little Help needed moving from lying on your back to sitting on the side of a flat bed without using bedrails?: A Little Help needed moving to and from a bed to a chair (including a wheelchair)?: A Little Help needed standing up from a chair using your arms (e.g., wheelchair or bedside chair)?: A Little Help needed to walk in hospital room?: A Little Help needed climbing 3-5 steps with a railing? : A Little 6 Click Score: 18    End of Session Equipment Utilized During Treatment: Gait belt Activity Tolerance: Patient tolerated treatment well Patient left: in chair;with call bell/phone within reach Nurse Communication: Mobility status PT Visit Diagnosis: Other abnormalities of gait and mobility (R26.89);Muscle weakness (generalized) (M62.81);Other symptoms and signs involving the nervous system (R29.898)     Time: 1610-9604 PT Time Calculation (min) (ACUTE ONLY): 24 min  Charges:    $Gait Training: 23-37 mins PT General Charges $$ ACUTE PT VISIT: 1 Visit                     Arlyss Gandy, PT, DPT Acute Rehabilitation Office [phone]    Arlyss Gandy 03/10/2024, 4:53 PM

## 2024-03-10 NOTE — Progress Notes (Signed)
 Subjective: 1 Day Post-Op Procedure(s) (LRB): ARTHROPLASTY, KNEE, TOTAL (Left) Patient reports pain as quite significant.   Objective: Vital signs in last 24 hours: Temp:  [97.2 F (36.2 C)-98.8 F (37.1 C)] 97.5 F (36.4 C) (04/02 0507) Pulse Rate:  [61-96] 96 (04/02 0507) Resp:  [9-18] 17 (04/02 0507) BP: (115-165)/(58-91) 149/91 (04/02 0507) SpO2:  [93 %-99 %] 97 % (04/02 0507) Weight:  [97.5 kg] 97.5 kg (04/01 1000)  Intake/Output from previous day: 04/01 0701 - 04/02 0700 In: 2244.8 [I.V.:1844.8; IV Piggyback:400] Out: 2200 [Urine:2150; Blood:50] Intake/Output this shift: No intake/output data recorded.  Recent Labs    03/10/24 0539  HGB 12.3   Recent Labs    03/10/24 0539  WBC 18.3*  RBC 4.03  HCT 36.6  PLT 261   Recent Labs    03/10/24 0539  NA 134*  K 4.1  CL 101  CO2 22  BUN 16  CREATININE 0.86  GLUCOSE 132*  CALCIUM 8.7*   No results for input(s): "LABPT", "INR" in the last 72 hours.  Sensation intact distally Intact pulses distally Dorsiflexion/Plantar flexion intact Incision: scant drainage Compartment soft   Assessment/Plan: 1 Day Post-Op Procedure(s) (LRB): ARTHROPLASTY, KNEE, TOTAL (Left) Up with therapy      Kathryne Hitch 03/10/2024, 7:32 AM

## 2024-03-10 NOTE — Progress Notes (Signed)
 Pt's pain is not under control and she wants to leave her foley in until she talks to the provider. I shared this information with Hospital doctor, RN - day shift nurse.

## 2024-03-10 NOTE — Progress Notes (Signed)
 Physical Therapy Treatment Patient Details Name: Beth [name] MRN: [id number] DOB: 05-11-1956 Today's Date: 03/10/2024   History of Present Illness 68 y.o. female presents to [facility] hospital on 03/09/2024 for elective L TKA. PMH includes OSA, insomnia, depression, RLS, OA, anxiety.    PT Comments  Pt tolerates treatment well, ambulating for increased distances. Pt does report fatigue in BUE when ambulating, tiring from offloading LLE during weightbearing. Pt does also report symptoms of lightheadedness and is diaphoretic near completion of ambulation, found to be hypotensive. Pt recovers well when seated and resting. PT encourages quad sets and heel slides to improve activation of LLE musculature and to improve available AROM at knee. PT will follow for progression of activity tolerance and to eventually initiate stair training.    If plan is discharge home, recommend the following: A little help with walking and/or transfers;A little help with bathing/dressing/bathroom;Assistance with cooking/housework;Assist for transportation;Help with stairs or ramp for entrance   Can travel by private vehicle        Equipment Recommendations  None recommended by PT    Recommendations for Other Services       Precautions / Restrictions Precautions Precautions: Fall;Knee Precaution Booklet Issued: Yes (comment) Recall of Precautions/Restrictions: Intact Restrictions Weight Bearing Restrictions Per Provider Order: Yes LLE Weight Bearing Per Provider Order: Weight bearing as tolerated     Mobility  Bed Mobility Overal bed mobility: Needs Assistance Bed Mobility: Supine to Sit     Supine to sit: Min assist, HOB elevated     General bed mobility comments: assistance for LLE mobility    Transfers Overall transfer level: Needs assistance Equipment used: Rolling walker (2 wheels) Transfers: Sit to/from Stand Sit to Stand: Contact guard assist                 Ambulation/Gait Ambulation/Gait assistance: Contact guard assist Gait Distance (Feet): 30 Feet Assistive device: Rolling walker (2 wheels) Gait Pattern/deviations: Step-to pattern Gait velocity: reduced Gait velocity interpretation: <1.31 ft/sec, indicative of household ambulator   General Gait Details: slowed step-to gait with reduced stance time on LLE. Pt with multiple standing rest breaks due to reports of UE fatigue   Stairs             Wheelchair Mobility     Tilt Bed    Modified Rankin (Stroke Patients Only)       Balance Overall balance assessment: Needs assistance Sitting-balance support: No upper extremity supported, Feet supported Sitting balance-Leahy Scale: Good     Standing balance support: Bilateral upper extremity supported, Reliant on assistive device for balance Standing balance-Leahy Scale: Poor                              Communication Communication Communication: No apparent difficulties  Cognition Arousal: Alert Behavior During Therapy: WFL for tasks assessed/performed   PT - Cognitive impairments: No apparent impairments                         Following commands: Intact      Cueing Cueing Techniques: Verbal cues  Exercises Total Joint Exercises Goniometric ROM: -4 degrees active extension, 60 degrees active assist knee flexion Other Exercises Other Exercises: PT encourages performance of quad sets and heels slides during the day between PT sessions    General Comments General comments (skin integrity, edema, etc.): pt with hypotension noted after ambulation, BP 102/56, and with diaphoresis. Pt recovers well when seated  with improvement in symptoms and BP of 122/65 at end of session      Pertinent Vitals/Pain Pain Assessment Pain Assessment: 0-10 Pain Score: 4  Pain Location: L knee Pain Descriptors / Indicators: Grimacing Pain Intervention(s): Monitored during session    Home Living                           Prior Function            PT Goals (current goals can now be found in the care plan section) Acute Rehab PT Goals Patient Stated Goal: to return to independence Progress towards PT goals: Progressing toward goals    Frequency    7X/week      PT Plan      Co-evaluation              AM-PAC PT "6 Clicks" Mobility   Outcome Measure  Help needed turning from your back to your side while in a flat bed without using bedrails?: A Little Help needed moving from lying on your back to sitting on the side of a flat bed without using bedrails?: A Little Help needed moving to and from a bed to a chair (including a wheelchair)?: A Little Help needed standing up from a chair using your arms (e.g., wheelchair or bedside chair)?: A Little Help needed to walk in hospital room?: A Little Help needed climbing 3-5 steps with a railing? : Total 6 Click Score: 16    End of Session Equipment Utilized During Treatment: Gait belt Activity Tolerance: Patient tolerated treatment well Patient left: in chair;with call bell/phone within reach Nurse Communication: Mobility status PT Visit Diagnosis: Other abnormalities of gait and mobility (R26.89);Muscle weakness (generalized) (M62.81);Other symptoms and signs involving the nervous system (R29.898)     Time: 1478-2956 PT Time Calculation (min) (ACUTE ONLY): 38 min  Charges:    $Gait Training: 23-37 mins $Therapeutic Activity: 8-22 mins PT General Charges $$ ACUTE PT VISIT: 1 Visit                     Arlyss Gandy, PT, DPT Acute Rehabilitation Office [phone]    Arlyss Gandy 03/10/2024, 10:27 AM

## 2024-03-10 NOTE — Plan of Care (Signed)
   Problem: Health Behavior/Discharge Planning: Goal: Ability to manage health-related needs will improve Outcome: Progressing

## 2024-03-11 MED ORDER — TIZANIDINE HCL 2 MG PO TABS: 2.0000 mg | ORAL_TABLET | Freq: Four times a day (QID) | ORAL | 1 refills | Status: DC | PRN

## 2024-03-11 MED ORDER — ASPIRIN 81 MG PO CHEW
81.0000 mg | CHEWABLE_TABLET | Freq: Two times a day (BID) | ORAL | 0 refills | Status: DC
Start: 2024-03-11 — End: 2024-09-29

## 2024-03-11 MED ORDER — OXYCODONE HCL 5 MG PO TABS: 5.0000 mg | ORAL_TABLET | Freq: Four times a day (QID) | ORAL | 0 refills | Status: DC | PRN

## 2024-03-11 NOTE — TOC Transition Note (Signed)
 Transition of Care [facility]) - Discharge Note   Patient Details  Name: Beth [name] MRN: [id number] Date of Birth: 16-Oct-1956  [facility] CM/SW Contact:  Epifanio Lesches, RN Phone Number: 03/11/2024, 10:23 AM   Clinical Narrative:    Patient will DC to: home Anticipated DC date: 03/11/2024 Family notified: yes Transport by: car     - S/P L TKA, 4/1 Per MD patient ready for DC today. RN, patient, patient's husband,and [facility] notified of DC. Pt without DME need, already with [facility] @ home. Post hospital f/u noted on AVS. Husband to provide transportation to home. Pt without RX med concerns. RNCM will sign off for now as intervention is no longer needed. Please consult [place] again if new needs arise.   Final next level of care: Home w Home Health Services Barriers to Discharge: No Barriers Identified   Patient Goals and CMS Choice            Discharge Placement                       Discharge Plan and Services Additional resources added to the After Visit Summary for                            [facility] Arranged: PT HH Agency: [facility] Date [facility] Agency Contacted: 03/11/24 Time HH Agency Contacted: 1022 Representative spoke with at [facility] Agency: Haywood Lasso  Social Drivers of Health (SDOH) Interventions SDOH Screenings   Food Insecurity: No Food Insecurity (03/09/2024)  Housing: Low Risk  (03/09/2024)  Transportation Needs: No Transportation Needs (03/09/2024)  Utilities: Not At Risk (03/09/2024)  Alcohol Screen: Low Risk  (08/04/2023)  Depression (PHQ2-9): Low Risk  (08/05/2023)  Financial Resource Strain: Low Risk  (08/04/2023)  Physical Activity: Patient Declined (08/04/2023)  Social Connections: Socially Integrated (03/09/2024)  Stress: No Stress Concern Present (08/04/2023)  Tobacco Use: Medium Risk (03/09/2024)  Health Literacy: Adequate Health Literacy (08/05/2023)     Readmission Risk Interventions     No data to display

## 2024-03-11 NOTE — Progress Notes (Signed)
 Beth [name] called her husband who is transporting pt home. AVS teaching done as well as teaching done on follow up appointments, prescription on how to take and where to pick up, reasons to contact MD or call 911, signs and symptoms of infection educated, pt verbalized understanding of all teaching. Pt to discharge lounge via transport chair.

## 2024-03-11 NOTE — Discharge Summary (Signed)
 Patient ID: Beth [name] MRN: [id number] DOB/AGE: 07-04-1956 68 y.o.  Admit date: 03/09/2024 Discharge date: 03/11/2024  Admission Diagnoses:  Principal Problem:   Unilateral primary osteoarthritis, left knee Active Problems:   Status post total left knee replacement   Discharge Diagnoses:  Same  Past Medical History:  Diagnosis Date   Anxiety    Arthritis    Depression    Sleep apnea    does not uses CPAP; uses mouth guard.     Surgeries: Procedure(s): ARTHROPLASTY, KNEE, TOTAL on 03/09/2024   Consultants:   Discharged Condition: Improved  Hospital Course: Dnya Hickle is an 68 y.o. female who was admitted 03/09/2024 for operative treatment ofUnilateral primary osteoarthritis, left knee. Patient has severe unremitting pain that affects sleep, daily activities, and work/hobbies. After pre-op clearance the patient was taken to the operating room on 03/09/2024 and underwent  Procedure(s): ARTHROPLASTY, KNEE, TOTAL.    Patient was given perioperative antibiotics:  Anti-infectives (From admission, onward)    Start     Dose/Rate Route Frequency Ordered Stop   03/09/24 1800  ceFAZolin (ANCEF) IVPB 2g/100 mL premix        2 g 200 mL/hr over 30 Minutes Intravenous Every 6 hours 03/09/24 1453 03/10/24 0727   03/09/24 0945  ceFAZolin (ANCEF) IVPB 2g/100 mL premix        2 g 200 mL/hr over 30 Minutes Intravenous On call to O.R. 03/09/24 0939 03/09/24 1152   03/09/24 0943  ceFAZolin (ANCEF) 2-4 GM/100ML-% IVPB       Note to Pharmacy: Lacie Draft A: cabinet override      03/09/24 0943 03/09/24 1158        Patient was given sequential compression devices, early ambulation, and chemoprophylaxis to prevent DVT.  Patient benefited maximally from hospital stay and there were no complications.    Recent vital signs: Patient Vitals for the past 24 hrs:  BP Temp Temp src Pulse Resp SpO2  03/11/24 0756 (!) 147/75 98.7 F (37.1 C) Oral -- 17 98 %  03/11/24 0500 (!) 143/64 99 F  (37.2 C) Oral -- 16 93 %  03/10/24 2119 124/79 98.9 F (37.2 C) Oral (!) 108 -- 99 %  03/10/24 1555 (!) 142/60 97.9 F (36.6 C) Oral 80 15 99 %     Recent laboratory studies:  Recent Labs    03/10/24 0539  WBC 18.3*  HGB 12.3  HCT 36.6  PLT 261  NA 134*  K 4.1  CL 101  CO2 22  BUN 16  CREATININE 0.86  GLUCOSE 132*  CALCIUM 8.7*     Discharge Medications:   Allergies as of 03/11/2024   No Known Allergies      Medication List     TAKE these medications    aspirin 81 MG chewable tablet Chew 1 tablet (81 mg total) by mouth 2 (two) times daily.   b complex vitamins tablet Take 1 tablet by mouth daily.   CALCIUM PO Take 1 tablet by mouth daily.   celecoxib 200 MG capsule Commonly known as: CELEBREX TAKE 1 CAPSULE BY MOUTH EVERY DAY   ferrous sulfate 325 (65 FE) MG tablet Take 650 mg by mouth daily with breakfast.   Fish Oil 1000 MG Caps Take 3 capsules (3,000 mg total) by mouth in the morning and at bedtime. What changed: when to take this   magnesium oxide 400 (240 Mg) MG tablet Commonly known as: MAG-OX Take 400 mg by mouth daily.   oxyCODONE 5 MG immediate release tablet  Commonly known as: Oxy IR/ROXICODONE Take 1-2 tablets (5-10 mg total) by mouth every 6 (six) hours as needed for moderate pain (pain score 4-6) (pain score 4-6).   sertraline 100 MG tablet Commonly known as: ZOLOFT TAKE 1 TABLET BY MOUTH EVERY DAY   tiZANidine 2 MG tablet Commonly known as: ZANAFLEX Take 1 tablet (2 mg total) by mouth every 6 (six) hours as needed for muscle spasms.   triamcinolone cream 0.1 % Commonly known as: KENALOG APPLY 1 APPLICATION TOPICALLY 2 (TWO) TIMES DAILY. TO EAR CANALS What changed:  when to take this reasons to take this   Vitamin D 50 MCG (2000 UT) Caps Take 2,000 Units by mouth daily.   zolpidem 10 MG tablet Commonly known as: Ambien Take 1 tablet (10 mg total) by mouth at bedtime as needed for sleep. What changed: when to take this                Durable Medical Equipment  (From admission, onward)           Start     Ordered   03/09/24 1454  DME 3 n 1  Once        03/09/24 1453   03/09/24 1454  DME Walker rolling  Once       Question Answer Comment  Walker: With 5 Inch Wheels   Patient needs a walker to treat with the following condition Status post total left knee replacement      03/09/24 1453            Diagnostic Studies: DG Knee Left Port Result Date: 03/09/2024 CLINICAL DATA:  Status post total knee replacement. EXAM: PORTABLE LEFT KNEE - 2 VIEW COMPARISON:  Lateral view left knee 01/21/2024 preop FINDINGS: Total knee arthroplasty identified. Press-Fit components. Patellar button. Expected alignment. No fracture or dislocation. Midline anterior skin staples. Scattered soft tissue gas. Imaging was obtained to aid in treatment. IMPRESSION: Acute surgical changes of total knee arthroplasty. Electronically Signed   By: Karen Kays M.D.   On: 03/09/2024 19:28    Disposition: Discharge disposition: 01-Home or Self Care          Follow-up Information     Sandford Craze, NP Follow up.   Specialty: Internal Medicine Contact information: [address] [place] [address] [phone]         [facility] Follow up.   Specialty: Home Health Services Why: home health services will be provided by [facility] Contact information: [address] [place] [address] [place] [address] [phone]         [facility] Follow up on 03/22/2024.   Why: Postop with Kathryne Hitch Monday Mar 22, 2024 10:45 AM Contact information: [address] [place] [address] [phone]                 Signed: Kathryne Hitch 03/11/2024, 12:37 PM

## 2024-03-11 NOTE — Progress Notes (Signed)
 Subjective: 2 Days Post-Op Procedure(s) (LRB): ARTHROPLASTY, KNEE, TOTAL (Left) Patient reports pain as moderate.    Objective: Vital signs in last 24 hours: Temp:  [97.9 F (36.6 C)-99 F (37.2 C)] 98.7 F (37.1 C) (04/03 0756) Pulse Rate:  [80-108] 108 (04/02 2119) Resp:  [15-17] 17 (04/03 0756) BP: (124-147)/(60-79) 147/75 (04/03 0756) SpO2:  [93 %-99 %] 98 % (04/03 0756)  Intake/Output from previous day: 04/02 0701 - 04/03 0700 In: 1037.1 [P.O.:240; I.V.:797.1] Out: 2300 [Urine:2300] Intake/Output this shift: Total I/O In: 240 [P.O.:240] Out: -   Recent Labs    03/10/24 0539  HGB 12.3   Recent Labs    03/10/24 0539  WBC 18.3*  RBC 4.03  HCT 36.6  PLT 261   Recent Labs    03/10/24 0539  NA 134*  K 4.1  CL 101  CO2 22  BUN 16  CREATININE 0.86  GLUCOSE 132*  CALCIUM 8.7*   No results for input(s): "LABPT", "INR" in the last 72 hours.  Sensation intact distally Intact pulses distally Dorsiflexion/Plantar flexion intact Incision: dressing C/D/I Compartment soft   Assessment/Plan: 2 Days Post-Op Procedure(s) (LRB): ARTHROPLASTY, KNEE, TOTAL (Left) Up with therapy Discharge home with home health this afternoon.      Kathryne Hitch 03/11/2024, 9:24 AM

## 2024-03-11 NOTE — Progress Notes (Signed)
 Physical Therapy Treatment Patient Details Name: Beth [name] MRN: [id number] DOB: [birth year] Today's Date: 03/11/2024   History of Present Illness 68 y.o. female presents to [facility] hospital on 03/09/2024 for elective L TKA. PMH includes OSA, insomnia, depression, RLS, OA, anxiety.    PT Comments  Pt endorsing fatigue and moderate L knee pain, but eager to d/c home. Pt ambulatory for x2 short hallway distances, needs seated rest break given UE fatigue. Pt states she only has to walk short distances at home. Pt tolerated TKR exercises well, handout reviewed and administered, pt with no further questions. All acute PT training and education completed, appropriate to d/c home from PT standpoint.    If plan is discharge home, recommend the following: A little help with walking and/or transfers;A little help with bathing/dressing/bathroom;Assistance with cooking/housework;Assist for transportation;Help with stairs or ramp for entrance   Can travel by private vehicle        Equipment Recommendations  None recommended by PT    Recommendations for Other Services       Precautions / Restrictions Precautions Precautions: Fall;Knee Precaution Booklet Issued: Yes (comment) Recall of Precautions/Restrictions: Intact Restrictions Weight Bearing Restrictions Per Provider Order: Yes LLE Weight Bearing Per Provider Order: Weight bearing as tolerated     Mobility  Bed Mobility                    Transfers Overall transfer level: Needs assistance Equipment used: Rolling walker (2 wheels) Transfers: Sit to/from Stand Sit to Stand: Supervision           General transfer comment: for safety, appropriate hand placement when rising/sitting    Ambulation/Gait Ambulation/Gait assistance: Supervision Gait Distance (Feet): 50 Feet (x2 - seated rest break) Assistive device: Rolling walker (2 wheels) Gait Pattern/deviations: Step-to pattern, Trunk flexed Gait velocity: decr     General  Gait Details: for safety, cues for upright posture, correct placement in RW during gait, looking forward vs down during gait   Stairs             Wheelchair Mobility     Tilt Bed    Modified Rankin (Stroke Patients Only)       Balance Overall balance assessment: Needs assistance Sitting-balance support: No upper extremity supported, Feet supported Sitting balance-Leahy Scale: Good     Standing balance support: Bilateral upper extremity supported, Reliant on assistive device for balance Standing balance-Leahy Scale: Poor                              Communication Communication Communication: No apparent difficulties  Cognition Arousal: Alert Behavior During Therapy: WFL for tasks assessed/performed   PT - Cognitive impairments: No apparent impairments                         Following commands: Intact      Cueing Cueing Techniques: Verbal cues  Exercises Total Joint Exercises Ankle Circles/Pumps: AROM, Both, 5 reps, Seated Quad Sets: AROM, Left, 5 reps, Seated Short Arc Quad: AROM, Left, 5 reps, Seated Heel Slides: AAROM, Left, 5 reps, Seated Hip ABduction/ADduction: AROM, Left, 5 reps, Seated Goniometric ROM: knee aarom approx 5-50 deg, limited by pain and stiffness    General Comments   Home walking program: up and walking 1x/hour during waking hours for short household distances with supervision of family, to promote circulation, activity tolerance, and strength maintenance.        Pertinent  Vitals/Pain Pain Assessment Pain Assessment: 0-10 Pain Score: 5  Pain Location: L knee Pain Descriptors / Indicators: Grimacing Pain Intervention(s): Limited activity within patient's tolerance, Monitored during session, Repositioned    Home Living                          Prior Function            PT Goals (current goals can now be found in the care plan section) Acute Rehab PT Goals Patient Stated Goal: to return to  independence PT Goal Formulation: With patient Time For Goal Achievement: 03/13/24 Potential to Achieve Goals: Good Progress towards PT goals: Progressing toward goals    Frequency    7X/week      PT Plan      Co-evaluation              AM-PAC PT "6 Clicks" Mobility   Outcome Measure  Help needed turning from your back to your side while in a flat bed without using bedrails?: A Little Help needed moving from lying on your back to sitting on the side of a flat bed without using bedrails?: A Little Help needed moving to and from a bed to a chair (including a wheelchair)?: A Little Help needed standing up from a chair using your arms (e.g., wheelchair or bedside chair)?: A Little Help needed to walk in hospital room?: A Little Help needed climbing 3-5 steps with a railing? : A Little 6 Click Score: 18    End of Session Equipment Utilized During Treatment: Gait belt Activity Tolerance: Patient tolerated treatment well Patient left: in chair;with call bell/phone within reach Nurse Communication: Mobility status PT Visit Diagnosis: Other abnormalities of gait and mobility (R26.89);Muscle weakness (generalized) (M62.81);Other symptoms and signs involving the nervous system (R29.898)     Time: 1914-7829 PT Time Calculation (min) (ACUTE ONLY): 26 min  Charges:    $Gait Training: 8-22 mins $Therapeutic Exercise: 8-22 mins PT General Charges $$ ACUTE PT VISIT: 1 Visit                     Marye Round, PT DPT Acute Rehabilitation Services Secure Chat Preferred  Office [phone]    Abdalla Naramore E Stroup 03/11/2024, 11:18 AM

## 2024-03-12 ENCOUNTER — Telehealth: Payer: Self-pay

## 2024-03-12 NOTE — Transitions of Care (Post Inpatient/ED Visit) (Signed)
   03/12/2024  Name: Beth [name] MRN: [id number] DOB: [birth year]  Today's TOC FU Call Status: Today's TOC FU Call Status:: Successful TOC FU Call Completed TOC FU Call Complete Date: 03/12/24 Patient's Name and Date of Birth confirmed.  Transition Care Management Follow-up Telephone Call Date of Discharge: 03/11/24 Discharge Facility: [facility]) Type of Discharge: Inpatient Admission Primary Inpatient Discharge Diagnosis:: right knee artici How have you been since you were released from the hospital?: Better Any questions or concerns?: No  Items Reviewed: Did you receive and understand the discharge instructions provided?: Yes Medications obtained,verified, and reconciled?: Yes (Medications Reviewed) Any new allergies since your discharge?: No Dietary orders reviewed?: Yes Do you have support at home?: Yes People in Home: spouse  Medications Reviewed Today: Medications Reviewed Today   Medications were not reviewed in this encounter     Home Care and Equipment/Supplies: Were Home Health Services Ordered?: Yes Name of Home Health Agency:: unknown Has Agency set up a time to come to your home?: No Any new equipment or medical supplies ordered?: NA  Functional Questionnaire: Do you need assistance with bathing/showering or dressing?: No Do you need assistance with meal preparation?: No Do you need assistance with eating?: No Do you have difficulty maintaining continence: No Do you need assistance with getting out of bed/getting out of a chair/moving?: No Do you have difficulty managing or taking your medications?: No  Follow up appointments reviewed: PCP Follow-up appointment confirmed?: Yes Date of PCP follow-up appointment?: 03/16/24 Follow-up Provider: [facility] Follow-up appointment confirmed?: Yes Date of Specialist follow-up appointment?: 03/22/24 Follow-Up Specialty Provider:: surgeon Do you need transportation to your follow-up  appointment?: No Do you understand care options if your condition(s) worsen?: Yes-patient verbalized understanding    SIGNATURE Karena Addison, LPN [facility] Nurse Health Advisor Direct Dial [phone]

## 2024-03-13 DIAGNOSIS — Z96611 Presence of right artificial shoulder joint: Secondary | ICD-10-CM | POA: Diagnosis not present

## 2024-03-13 DIAGNOSIS — Z471 Aftercare following joint replacement surgery: Secondary | ICD-10-CM | POA: Diagnosis not present

## 2024-03-13 DIAGNOSIS — Z96653 Presence of artificial knee joint, bilateral: Secondary | ICD-10-CM | POA: Diagnosis not present

## 2024-03-13 DIAGNOSIS — B351 Tinea unguium: Secondary | ICD-10-CM | POA: Diagnosis not present

## 2024-03-13 DIAGNOSIS — Z87891 Personal history of nicotine dependence: Secondary | ICD-10-CM | POA: Diagnosis not present

## 2024-03-13 DIAGNOSIS — D649 Anemia, unspecified: Secondary | ICD-10-CM | POA: Diagnosis not present

## 2024-03-13 DIAGNOSIS — E781 Pure hyperglyceridemia: Secondary | ICD-10-CM | POA: Diagnosis not present

## 2024-03-13 DIAGNOSIS — F32A Depression, unspecified: Secondary | ICD-10-CM | POA: Diagnosis not present

## 2024-03-13 DIAGNOSIS — G2581 Restless legs syndrome: Secondary | ICD-10-CM | POA: Diagnosis not present

## 2024-03-13 DIAGNOSIS — Z9181 History of falling: Secondary | ICD-10-CM | POA: Diagnosis not present

## 2024-03-13 DIAGNOSIS — G4700 Insomnia, unspecified: Secondary | ICD-10-CM | POA: Diagnosis not present

## 2024-03-13 DIAGNOSIS — Z791 Long term (current) use of non-steroidal anti-inflammatories (NSAID): Secondary | ICD-10-CM | POA: Diagnosis not present

## 2024-03-13 DIAGNOSIS — F419 Anxiety disorder, unspecified: Secondary | ICD-10-CM | POA: Diagnosis not present

## 2024-03-13 DIAGNOSIS — Z7982 Long term (current) use of aspirin: Secondary | ICD-10-CM | POA: Diagnosis not present

## 2024-03-13 DIAGNOSIS — G5603 Carpal tunnel syndrome, bilateral upper limbs: Secondary | ICD-10-CM | POA: Diagnosis not present

## 2024-03-13 DIAGNOSIS — G4733 Obstructive sleep apnea (adult) (pediatric): Secondary | ICD-10-CM | POA: Diagnosis not present

## 2024-03-13 DIAGNOSIS — Z6838 Body mass index (BMI) 38.0-38.9, adult: Secondary | ICD-10-CM | POA: Diagnosis not present

## 2024-03-16 ENCOUNTER — Inpatient Hospital Stay: Admitting: Family

## 2024-03-16 ENCOUNTER — Ambulatory Visit (INDEPENDENT_AMBULATORY_CARE_PROVIDER_SITE_OTHER): Admitting: Family

## 2024-03-16 VITALS — BP 130/60 | HR 84 | Temp 98.5°F | Resp 16 | Ht 63.0 in | Wt 221.0 lb

## 2024-03-16 DIAGNOSIS — R739 Hyperglycemia, unspecified: Secondary | ICD-10-CM | POA: Diagnosis not present

## 2024-03-16 DIAGNOSIS — G4733 Obstructive sleep apnea (adult) (pediatric): Secondary | ICD-10-CM

## 2024-03-16 DIAGNOSIS — D72829 Elevated white blood cell count, unspecified: Secondary | ICD-10-CM

## 2024-03-16 DIAGNOSIS — L309 Dermatitis, unspecified: Secondary | ICD-10-CM | POA: Diagnosis not present

## 2024-03-16 DIAGNOSIS — Z96652 Presence of left artificial knee joint: Secondary | ICD-10-CM | POA: Diagnosis not present

## 2024-03-16 DIAGNOSIS — F32A Depression, unspecified: Secondary | ICD-10-CM

## 2024-03-16 NOTE — Progress Notes (Signed)
 Subjective:     Patient ID: Beth [name], female    DOB: 1956-04-20, 68 y.o.   MRN: [id number]  Chief Complaint  Patient presents with   Follow-up    Here for follow up after left knee surgery 03/09/24.     HPI  Discussed the use of AI scribe software for clinical note transcription with the patient, who gave verbal consent to proceed.  History of Present Illness  Beth [name] is a 68 year old female who presents for follow-up after left knee replacement surgery.  She is a little over a week post-operation following left knee surgery on April 1st. She stayed in the hospital for two nights post-surgery. Initially, there were issues with pain management, but once the pain medication was administered, she felt better. No recent issues with her incision, although she has difficulty removing the yellow surgical dressing. She has been able to shower and is awaiting further physical therapy sessions at home, having only had an initial assessment visit. Recent lab work showed a slightly low calcium level, likely due to fasting and not taking vitamins, and a high white blood cell count, likely related to being post-operative.  She is experiencing difficulties with her current sleep specialist due to issues with prescription management for Ambien. She uses a CPAP machine for sleep apnea and is interested in switching to another sleep specialist due to administrative challenges. She is currently taking sertraline for mood, which is going well.  She completed a 12-week course of medication for a thickened toenail, but there has been no improvement in the fresh growth at the base of the nail over the past year.  Wt Readings from Last 3 Encounters:  03/16/24 221 lb (100.2 kg)  03/09/24 215 lb (97.5 kg)  03/01/24 220 lb 6.4 oz (100 kg)      Health Maintenance Due  Topic Date Due   COVID-19 Vaccine (5 - 2024-25 season) 08/10/2023    Past Medical History:  Diagnosis Date   Anxiety     Arthritis    Depression    Sleep apnea    does not uses CPAP; uses mouth guard.     Past Surgical History:  Procedure Laterality Date   ANKLE SURGERY Right 2008   BACK SURGERY  2009   BUNIONECTOMY Right    COLONOSCOPY     COSMETIC SURGERY     JOINT REPLACEMENT  '13, '21   r knee, r shoulder   NISSEN FUNDOPLICATION     REPLACEMENT TOTAL KNEE Right 2012   SPINE SURGERY  '09   back   TONSILLECTOMY     TOTAL KNEE ARTHROPLASTY Left 03/09/2024   Procedure: ARTHROPLASTY, KNEE, TOTAL;  Surgeon: Kathryne Hitch, MD;  Location: [facility] OR;  Service: Orthopedics;  Laterality: Left;   TOTAL SHOULDER ARTHROPLASTY Right 03/28/2020   Procedure: RIGHT SHOULDER REPLACEMENT;  Surgeon: Cammy Copa, MD;  Location: [facility] OR;  Service: Orthopedics;  Laterality: Right;    Family History  Problem Relation Age of Onset   Breast cancer Mother 43   Cancer Mother    Depression Mother    Diabetes Father    Hydrocephalus Father    Lupus Maternal Aunt    Tuberculosis Maternal Grandmother    Alcohol abuse Maternal Grandfather        suicide   Diabetes Mellitus II Paternal Grandmother    Peripheral vascular disease Paternal Grandmother    Colon cancer Neg Hx    Colon polyps Neg Hx  Esophageal cancer Neg Hx    Stomach cancer Neg Hx    Rectal cancer Neg Hx     Social History   Socioeconomic History   Marital status: Married    Spouse name: Ron   Number of children: 2   Years of education: Not on file   Highest education level: Not on file  Occupational History   Occupation: retired  Tobacco Use   Smoking status: Former    Current packs/day: 0.00    Types: Cigarettes    Quit date: 07/22/1989    Years since quitting: 34.6   Smokeless tobacco: Never  Vaping Use   Vaping status: Never Used  Substance and Sexual Activity   Alcohol use: Yes    Comment: social, couple times a month   Drug use: Never   Sexual activity: Yes    Partners: Male  Other Topics Concern   Not on file   Social History Narrative   Retired Music therapist   Divorced (from [place] originally)   Remarried 2015 (husband is a IT trainer)   Has a dog   2 children (son and daughter) both married, daughter lives in [place], son in [place]. No grandchildren   Enjoys baking, sewing, reading   Social Drivers of Corporate investment banker Strain: Low Risk  (08/04/2023)   Overall Financial Resource Strain (CARDIA)    Difficulty of Paying Living Expenses: Not hard at all  Food Insecurity: No Food Insecurity (03/09/2024)   Hunger Vital Sign    Worried About Running Out of Food in the Last Year: Never true    Ran Out of Food in the Last Year: Never true  Transportation Needs: No Transportation Needs (03/09/2024)   PRAPARE - Administrator, Civil Service (Medical): No    Lack of Transportation (Non-Medical): No  Physical Activity: Patient Declined (08/04/2023)   Exercise Vital Sign    Days of Exercise per Week: Patient declined    Minutes of Exercise per Session: Patient declined  Stress: No Stress Concern Present (08/04/2023)   [employer] of Occupational Health - Occupational Stress Questionnaire    Feeling of Stress : Not at all  Social Connections: Socially Integrated (03/09/2024)   Social Connection and Isolation Panel [NHANES]    Frequency of Communication with Friends and Family: More than three times a week    Frequency of Social Gatherings with Friends and Family: Once a week    Attends Religious Services: More than 4 times per year    Active Member of [employer] or Organizations: Yes    Attends Banker Meetings: Patient declined    Marital Status: Married  Catering manager Violence: Not At Risk (03/09/2024)   Humiliation, Afraid, Rape, and Kick questionnaire    Fear of Current or Ex-Partner: No    Emotionally Abused: No    Physically Abused: No    Sexually Abused: No    Outpatient Medications Prior to Visit  Medication Sig Dispense Refill   aspirin 81 MG chewable tablet Chew  1 tablet (81 mg total) by mouth 2 (two) times daily. 30 tablet 0   b complex vitamins tablet Take 1 tablet by mouth daily.     CALCIUM MAGNESIUM ZINC PO Take by mouth.     CALCIUM PO Take 1 tablet by mouth daily.     celecoxib (CELEBREX) 200 MG capsule TAKE 1 CAPSULE BY MOUTH EVERY DAY 30 capsule 0   Cholecalciferol (VITAMIN D) 50 MCG (2000 UT) CAPS Take 2,000 Units by  mouth daily.     ferrous sulfate 325 (65 FE) MG tablet Take 650 mg by mouth daily with breakfast.     Magnesium 250 MG CAPS Take by mouth.     magnesium oxide (MAG-OX) 400 (240 Mg) MG tablet Take 400 mg by mouth daily.     Omega-3 Fatty Acids (FISH OIL) 1000 MG CAPS Take 3 capsules (3,000 mg total) by mouth in the morning and at bedtime. (Patient taking differently: Take 3 capsules by mouth daily.)  0   oxyCODONE (OXY IR/ROXICODONE) 5 MG immediate release tablet Take 1-2 tablets (5-10 mg total) by mouth every 6 (six) hours as needed for moderate pain (pain score 4-6) (pain score 4-6). 30 tablet 0   Phentermine HCl (LOMAIRA) 8 MG TABS Take by mouth.     sertraline (ZOLOFT) 100 MG tablet TAKE 1 TABLET BY MOUTH EVERY DAY 30 tablet 0   tiZANidine (ZANAFLEX) 2 MG tablet Take 1 tablet (2 mg total) by mouth every 6 (six) hours as needed for muscle spasms. 30 tablet 1   topiramate (TOPAMAX) 50 MG tablet Take 50 mg by mouth daily.     triamcinolone cream (KENALOG) 0.1 % APPLY 1 APPLICATION TOPICALLY 2 (TWO) TIMES DAILY. TO EAR CANALS (Patient taking differently: Apply 1 Application topically 2 (two) times daily as needed (itching). To ear canals) 30 g 0   zolpidem (AMBIEN) 10 MG tablet Take 1 tablet (10 mg total) by mouth at bedtime as needed for sleep. (Patient taking differently: Take 10 mg by mouth at bedtime.) 30 tablet 3   No facility-administered medications prior to visit.    No Known Allergies  ROS See HPI    Objective:    Physical Exam Constitutional:      General: She is not in acute distress.    Appearance: Normal  appearance. She is well-developed.  HENT:     Head: Normocephalic and atraumatic.     Right Ear: External ear normal.     Left Ear: External ear normal.  Eyes:     General: No scleral icterus. Neck:     Thyroid: No thyromegaly.  Cardiovascular:     Rate and Rhythm: Normal rate and regular rhythm.     Heart sounds: Normal heart sounds. No murmur heard. Pulmonary:     Effort: Pulmonary effort is normal. No respiratory distress.     Breath sounds: Normal breath sounds. No wheezing.  Musculoskeletal:     Cervical back: Neck supple.  Skin:    General: Skin is warm and dry.     Comments: Left knee dressing is dry and intact with scant serosanguinous drainage on dressing  Neurological:     Mental Status: She is alert and oriented to person, place, and time.  Psychiatric:        Mood and Affect: Mood normal.        Behavior: Behavior normal.        Thought Content: Thought content normal.        Judgment: Judgment normal.      BP 130/60 (BP Location: Right Arm, Patient Position: Sitting, Cuff Size: Large)   Pulse 84   Temp 98.5 F (36.9 C) (Oral)   Resp 16   Ht 5\' 3"  (1.6 m)   Wt 221 lb (100.2 kg)   SpO2 96%   BMI 39.15 kg/m  Wt Readings from Last 3 Encounters:  03/16/24 221 lb (100.2 kg)  03/09/24 215 lb (97.5 kg)  03/01/24 220 lb 6.4 oz (100 kg)  Assessment & Plan:   Problem List Items Addressed This Visit       Unprioritized   Status post total left knee replacement   Clinically stable post-op.  Has follow up scheduled with the surgeon.  Post-op WBC was 18 K, will repeat today to ensure downward trend.       OSA (obstructive sleep apnea)   Reports good compliance with CPAP.       Hyperglycemia - Primary   Lab Results  Component Value Date   HGBA1C 5.6 07/22/2023   Last A1C was normal. Plan to recheck A1C at her CPE later this summer.       Relevant Orders   Basic Metabolic Panel (BMET)   Eczema   Stable, only ever seems to occur in her ears.        Depression   Overall stable mood on sertraline.  Continue same.       Other Visit Diagnoses       Leukocytosis, unspecified type       Relevant Orders   CBC w/Diff       I am having Durwin Glaze maintain her ferrous sulfate, b complex vitamins, Vitamin D, CALCIUM PO, Fish Oil, triamcinolone cream, celecoxib, sertraline, zolpidem, magnesium oxide, aspirin, oxyCODONE, tiZANidine, topiramate, Magnesium, CALCIUM MAGNESIUM ZINC PO, and Lomaira.  No orders of the defined types were placed in this encounter.

## 2024-03-16 NOTE — Assessment & Plan Note (Signed)
 Stable, only ever seems to occur in her ears.

## 2024-03-16 NOTE — Patient Instructions (Signed)
 VISIT SUMMARY:  You had a follow-up appointment today after your left knee surgery. We discussed your recovery progress, sleep issues, mood, toenail condition, and general health maintenance.  YOUR PLAN:  -POSTOPERATIVE CARE FOLLOWING LEFT KNEE SURGERY: You are one week post-surgery with effective pain management and no concerns about your incision. We will repeat your white blood cell count today to monitor for any issues. Please follow up with your orthopedic surgeon on Monday and continue with your physical therapy sessions at home.  -SLEEP DISORDER: You are using a CPAP machine for sleep apnea and have had issues with Ambien prescription management. We recommend switching to Dr. Cathi Roan for your sleep management and continuing with your CPAP therapy. Sleep apnea is a condition where your breathing stops and starts during sleep.  -MOOD DISORDER: Your mood is stable on sertraline, and you are feeling well on your current dose. A mood disorder affects your emotional state and can impact your daily life.  -ONYCHOMYCOSIS: There has been no improvement in your thickened toenail after a 12-week medication course. Onychomycosis is a fungal infection of the toenail. If you want further treatment, we can refer you to a podiatrist.  -ECZEMA: Your previous ear eczema has improved, and there are no current issues. Eczema is a condition that makes your skin red and itchy.  -GENERAL HEALTH MAINTENANCE: You have lost 40 pounds, your A1c is normal, and you are a non-smoker. Great work. Your last physical was in August. We plan to have your next physical examination at the end of August.  INSTRUCTIONS:  Please follow up with your orthopedic surgeon on Monday and continue with your physical therapy sessions at home. We will repeat your white blood cell count today. If you want further treatment for your toenail condition, we can refer you to a podiatrist. Plan for a physical examination at the end of August.

## 2024-03-16 NOTE — Assessment & Plan Note (Signed)
Reports good compliance with CPAP 

## 2024-03-16 NOTE — Assessment & Plan Note (Signed)
 Overall stable mood on sertraline.  Continue same.

## 2024-03-16 NOTE — Assessment & Plan Note (Addendum)
 Lab Results  Component Value Date   HGBA1C 5.6 07/22/2023   Last A1C was normal. Plan to recheck A1C at her CPE later this summer.

## 2024-03-16 NOTE — Assessment & Plan Note (Signed)
 Clinically stable post-op.  Has follow up scheduled with the surgeon.  Post-op WBC was 18 K, will repeat today to ensure downward trend.

## 2024-03-17 ENCOUNTER — Encounter: Payer: Self-pay | Admitting: Family

## 2024-03-17 DIAGNOSIS — Z96653 Presence of artificial knee joint, bilateral: Secondary | ICD-10-CM | POA: Diagnosis not present

## 2024-03-17 DIAGNOSIS — Z791 Long term (current) use of non-steroidal anti-inflammatories (NSAID): Secondary | ICD-10-CM | POA: Diagnosis not present

## 2024-03-17 DIAGNOSIS — F32A Depression, unspecified: Secondary | ICD-10-CM | POA: Diagnosis not present

## 2024-03-17 DIAGNOSIS — Z6838 Body mass index (BMI) 38.0-38.9, adult: Secondary | ICD-10-CM | POA: Diagnosis not present

## 2024-03-17 DIAGNOSIS — Z9181 History of falling: Secondary | ICD-10-CM | POA: Diagnosis not present

## 2024-03-17 DIAGNOSIS — Z7982 Long term (current) use of aspirin: Secondary | ICD-10-CM | POA: Diagnosis not present

## 2024-03-17 DIAGNOSIS — B351 Tinea unguium: Secondary | ICD-10-CM | POA: Diagnosis not present

## 2024-03-17 DIAGNOSIS — Z96611 Presence of right artificial shoulder joint: Secondary | ICD-10-CM | POA: Diagnosis not present

## 2024-03-17 DIAGNOSIS — Z471 Aftercare following joint replacement surgery: Secondary | ICD-10-CM | POA: Diagnosis not present

## 2024-03-17 DIAGNOSIS — Z87891 Personal history of nicotine dependence: Secondary | ICD-10-CM | POA: Diagnosis not present

## 2024-03-17 DIAGNOSIS — E781 Pure hyperglyceridemia: Secondary | ICD-10-CM | POA: Diagnosis not present

## 2024-03-17 DIAGNOSIS — F419 Anxiety disorder, unspecified: Secondary | ICD-10-CM | POA: Diagnosis not present

## 2024-03-17 DIAGNOSIS — G2581 Restless legs syndrome: Secondary | ICD-10-CM | POA: Diagnosis not present

## 2024-03-17 DIAGNOSIS — D649 Anemia, unspecified: Secondary | ICD-10-CM | POA: Diagnosis not present

## 2024-03-17 DIAGNOSIS — G4733 Obstructive sleep apnea (adult) (pediatric): Secondary | ICD-10-CM | POA: Diagnosis not present

## 2024-03-17 DIAGNOSIS — G4700 Insomnia, unspecified: Secondary | ICD-10-CM | POA: Diagnosis not present

## 2024-03-17 DIAGNOSIS — G5603 Carpal tunnel syndrome, bilateral upper limbs: Secondary | ICD-10-CM | POA: Diagnosis not present

## 2024-03-17 LAB — BASIC METABOLIC PANEL WITH GFR
BUN: 27 mg/dL — ABNORMAL HIGH (ref 6–23)
CO2: 28 meq/L (ref 19–32)
Calcium: 9.3 mg/dL (ref 8.4–10.5)
Chloride: 102 meq/L (ref 96–112)
Creatinine, Ser: 1.06 mg/dL (ref 0.40–1.20)
GFR: 54.65 mL/min — ABNORMAL LOW (ref >60.00)
Glucose, Bld: 93 mg/dL (ref 70–99)
Potassium: 4.9 meq/L (ref 3.5–5.1)
Sodium: 140 meq/L (ref 135–145)

## 2024-03-17 LAB — CBC WITH DIFFERENTIAL/PLATELET
Basophils Absolute: 0.1 10*3/uL (ref 0.0–0.1)
Basophils Relative: 0.6 % (ref 0.0–3.0)
Eosinophils Absolute: 0.2 10*3/uL (ref 0.0–0.7)
Eosinophils Relative: 2.3 % (ref 0.0–5.0)
HCT: 33.7 % — ABNORMAL LOW (ref 36.0–46.0)
Hemoglobin: 11.4 g/dL — ABNORMAL LOW (ref 12.0–15.0)
Lymphocytes Relative: 14.9 % (ref 12.0–46.0)
Lymphs Abs: 1.5 10*3/uL (ref 0.7–4.0)
MCHC: 33.9 g/dL (ref 30.0–36.0)
MCV: 92.5 fl (ref 78.0–100.0)
Monocytes Absolute: 0.6 10*3/uL (ref 0.1–1.0)
Monocytes Relative: 5.7 % (ref 3.0–12.0)
Neutro Abs: 7.9 10*3/uL — ABNORMAL HIGH (ref 1.4–7.7)
Neutrophils Relative %: 76.5 % (ref 43.0–77.0)
Platelets: 395 10*3/uL (ref 150.0–400.0)
RBC: 3.65 Mil/uL — ABNORMAL LOW (ref 3.87–5.11)
RDW: 13.4 % (ref 11.5–15.5)
WBC: 10.4 10*3/uL (ref 4.0–10.5)

## 2024-03-18 DIAGNOSIS — G4700 Insomnia, unspecified: Secondary | ICD-10-CM | POA: Diagnosis not present

## 2024-03-18 DIAGNOSIS — Z96611 Presence of right artificial shoulder joint: Secondary | ICD-10-CM | POA: Diagnosis not present

## 2024-03-18 DIAGNOSIS — G5603 Carpal tunnel syndrome, bilateral upper limbs: Secondary | ICD-10-CM | POA: Diagnosis not present

## 2024-03-18 DIAGNOSIS — Z7982 Long term (current) use of aspirin: Secondary | ICD-10-CM | POA: Diagnosis not present

## 2024-03-18 DIAGNOSIS — G4733 Obstructive sleep apnea (adult) (pediatric): Secondary | ICD-10-CM | POA: Diagnosis not present

## 2024-03-18 DIAGNOSIS — E781 Pure hyperglyceridemia: Secondary | ICD-10-CM | POA: Diagnosis not present

## 2024-03-18 DIAGNOSIS — Z96653 Presence of artificial knee joint, bilateral: Secondary | ICD-10-CM | POA: Diagnosis not present

## 2024-03-18 DIAGNOSIS — Z87891 Personal history of nicotine dependence: Secondary | ICD-10-CM | POA: Diagnosis not present

## 2024-03-18 DIAGNOSIS — B351 Tinea unguium: Secondary | ICD-10-CM | POA: Diagnosis not present

## 2024-03-18 DIAGNOSIS — D649 Anemia, unspecified: Secondary | ICD-10-CM | POA: Diagnosis not present

## 2024-03-18 DIAGNOSIS — Z9181 History of falling: Secondary | ICD-10-CM | POA: Diagnosis not present

## 2024-03-18 DIAGNOSIS — G2581 Restless legs syndrome: Secondary | ICD-10-CM | POA: Diagnosis not present

## 2024-03-18 DIAGNOSIS — F419 Anxiety disorder, unspecified: Secondary | ICD-10-CM | POA: Diagnosis not present

## 2024-03-18 DIAGNOSIS — Z6838 Body mass index (BMI) 38.0-38.9, adult: Secondary | ICD-10-CM | POA: Diagnosis not present

## 2024-03-18 DIAGNOSIS — Z471 Aftercare following joint replacement surgery: Secondary | ICD-10-CM | POA: Diagnosis not present

## 2024-03-18 DIAGNOSIS — F32A Depression, unspecified: Secondary | ICD-10-CM | POA: Diagnosis not present

## 2024-03-18 DIAGNOSIS — Z791 Long term (current) use of non-steroidal anti-inflammatories (NSAID): Secondary | ICD-10-CM | POA: Diagnosis not present

## 2024-03-19 ENCOUNTER — Other Ambulatory Visit: Payer: Self-pay | Admitting: Family

## 2024-03-19 ENCOUNTER — Other Ambulatory Visit: Payer: Self-pay | Admitting: *Deleted

## 2024-03-19 DIAGNOSIS — G4700 Insomnia, unspecified: Secondary | ICD-10-CM | POA: Diagnosis not present

## 2024-03-19 DIAGNOSIS — F419 Anxiety disorder, unspecified: Secondary | ICD-10-CM | POA: Diagnosis not present

## 2024-03-19 DIAGNOSIS — Z96611 Presence of right artificial shoulder joint: Secondary | ICD-10-CM | POA: Diagnosis not present

## 2024-03-19 DIAGNOSIS — G4733 Obstructive sleep apnea (adult) (pediatric): Secondary | ICD-10-CM | POA: Diagnosis not present

## 2024-03-19 DIAGNOSIS — B351 Tinea unguium: Secondary | ICD-10-CM | POA: Diagnosis not present

## 2024-03-19 DIAGNOSIS — E781 Pure hyperglyceridemia: Secondary | ICD-10-CM | POA: Diagnosis not present

## 2024-03-19 DIAGNOSIS — Z6838 Body mass index (BMI) 38.0-38.9, adult: Secondary | ICD-10-CM | POA: Diagnosis not present

## 2024-03-19 DIAGNOSIS — Z471 Aftercare following joint replacement surgery: Secondary | ICD-10-CM | POA: Diagnosis not present

## 2024-03-19 DIAGNOSIS — Z791 Long term (current) use of non-steroidal anti-inflammatories (NSAID): Secondary | ICD-10-CM | POA: Diagnosis not present

## 2024-03-19 DIAGNOSIS — Z7982 Long term (current) use of aspirin: Secondary | ICD-10-CM | POA: Diagnosis not present

## 2024-03-19 DIAGNOSIS — Z87891 Personal history of nicotine dependence: Secondary | ICD-10-CM | POA: Diagnosis not present

## 2024-03-19 DIAGNOSIS — M1712 Unilateral primary osteoarthritis, left knee: Secondary | ICD-10-CM

## 2024-03-19 DIAGNOSIS — G2581 Restless legs syndrome: Secondary | ICD-10-CM | POA: Diagnosis not present

## 2024-03-19 DIAGNOSIS — D649 Anemia, unspecified: Secondary | ICD-10-CM | POA: Diagnosis not present

## 2024-03-19 DIAGNOSIS — Z96653 Presence of artificial knee joint, bilateral: Secondary | ICD-10-CM | POA: Diagnosis not present

## 2024-03-19 DIAGNOSIS — Z96652 Presence of left artificial knee joint: Secondary | ICD-10-CM

## 2024-03-19 DIAGNOSIS — Z9181 History of falling: Secondary | ICD-10-CM | POA: Diagnosis not present

## 2024-03-19 DIAGNOSIS — G5603 Carpal tunnel syndrome, bilateral upper limbs: Secondary | ICD-10-CM | POA: Diagnosis not present

## 2024-03-19 DIAGNOSIS — F32A Depression, unspecified: Secondary | ICD-10-CM | POA: Diagnosis not present

## 2024-03-19 MED ORDER — SERTRALINE HCL 100 MG PO TABS: 100.0000 mg | ORAL_TABLET | Freq: Every day | ORAL | 0 refills | Status: DC

## 2024-03-21 ENCOUNTER — Other Ambulatory Visit: Payer: Self-pay | Admitting: Orthopaedic Surgery

## 2024-03-22 ENCOUNTER — Encounter: Payer: Self-pay | Admitting: Orthopaedic Surgery

## 2024-03-22 ENCOUNTER — Ambulatory Visit: Payer: Medicare HMO | Admitting: Orthopaedic Surgery

## 2024-03-22 DIAGNOSIS — F419 Anxiety disorder, unspecified: Secondary | ICD-10-CM | POA: Diagnosis not present

## 2024-03-22 DIAGNOSIS — Z7982 Long term (current) use of aspirin: Secondary | ICD-10-CM | POA: Diagnosis not present

## 2024-03-22 DIAGNOSIS — Z96652 Presence of left artificial knee joint: Secondary | ICD-10-CM

## 2024-03-22 DIAGNOSIS — Z6838 Body mass index (BMI) 38.0-38.9, adult: Secondary | ICD-10-CM | POA: Diagnosis not present

## 2024-03-22 DIAGNOSIS — Z87891 Personal history of nicotine dependence: Secondary | ICD-10-CM | POA: Diagnosis not present

## 2024-03-22 DIAGNOSIS — G4733 Obstructive sleep apnea (adult) (pediatric): Secondary | ICD-10-CM | POA: Diagnosis not present

## 2024-03-22 DIAGNOSIS — G5603 Carpal tunnel syndrome, bilateral upper limbs: Secondary | ICD-10-CM | POA: Diagnosis not present

## 2024-03-22 DIAGNOSIS — G4700 Insomnia, unspecified: Secondary | ICD-10-CM | POA: Diagnosis not present

## 2024-03-22 DIAGNOSIS — Z9181 History of falling: Secondary | ICD-10-CM | POA: Diagnosis not present

## 2024-03-22 DIAGNOSIS — G2581 Restless legs syndrome: Secondary | ICD-10-CM | POA: Diagnosis not present

## 2024-03-22 DIAGNOSIS — E781 Pure hyperglyceridemia: Secondary | ICD-10-CM | POA: Diagnosis not present

## 2024-03-22 DIAGNOSIS — B351 Tinea unguium: Secondary | ICD-10-CM | POA: Diagnosis not present

## 2024-03-22 DIAGNOSIS — Z96611 Presence of right artificial shoulder joint: Secondary | ICD-10-CM | POA: Diagnosis not present

## 2024-03-22 DIAGNOSIS — Z96653 Presence of artificial knee joint, bilateral: Secondary | ICD-10-CM | POA: Diagnosis not present

## 2024-03-22 DIAGNOSIS — D649 Anemia, unspecified: Secondary | ICD-10-CM | POA: Diagnosis not present

## 2024-03-22 DIAGNOSIS — F32A Depression, unspecified: Secondary | ICD-10-CM | POA: Diagnosis not present

## 2024-03-22 DIAGNOSIS — Z471 Aftercare following joint replacement surgery: Secondary | ICD-10-CM | POA: Diagnosis not present

## 2024-03-22 DIAGNOSIS — Z791 Long term (current) use of non-steroidal anti-inflammatories (NSAID): Secondary | ICD-10-CM | POA: Diagnosis not present

## 2024-03-22 MED ORDER — OXYCODONE HCL 5 MG PO TABS
5.0000 mg | ORAL_TABLET | Freq: Four times a day (QID) | ORAL | 0 refills | Status: DC | PRN
Start: 2024-03-22 — End: 2024-09-29

## 2024-03-22 NOTE — Progress Notes (Signed)
 The patient is a 69 year old female is here today for first postoperative visit status post a left total knee replacement 2 weeks ago.  She started outpatient therapy either today or tomorrow.  She has had a remote history of a right knee replacement and has done well.  Her left knee is examined today.  Fortunately her extension is almost full and her flexion is easily to 90 degrees.  Her calf is soft.  Staples are removed and Steri-Strips applied.  Incision looks good.  She will transition outpatient therapy today or tomorrow.  Will see her back in a month to see how she is doing from a range of motion and mobility standpoint.  She can drive since that is her left knee but she needs to not have a narcotic in her system when she tries.  She needs to hold off on any dental cleanings for 3 months postop.  Again we will see her back in a month to see how she is doing overall.

## 2024-03-24 ENCOUNTER — Inpatient Hospital Stay: Admitting: Family

## 2024-03-29 ENCOUNTER — Ambulatory Visit: Attending: Orthopaedic Surgery | Admitting: Physical Therapy

## 2024-03-29 ENCOUNTER — Other Ambulatory Visit: Payer: Self-pay

## 2024-03-29 ENCOUNTER — Encounter: Payer: Self-pay | Admitting: Physical Therapy

## 2024-03-29 DIAGNOSIS — Z96652 Presence of left artificial knee joint: Secondary | ICD-10-CM | POA: Diagnosis not present

## 2024-03-29 DIAGNOSIS — G8929 Other chronic pain: Secondary | ICD-10-CM | POA: Insufficient documentation

## 2024-03-29 DIAGNOSIS — M1712 Unilateral primary osteoarthritis, left knee: Secondary | ICD-10-CM | POA: Insufficient documentation

## 2024-03-29 DIAGNOSIS — R600 Localized edema: Secondary | ICD-10-CM | POA: Diagnosis not present

## 2024-03-29 DIAGNOSIS — M25562 Pain in left knee: Secondary | ICD-10-CM | POA: Diagnosis not present

## 2024-03-29 DIAGNOSIS — M25662 Stiffness of left knee, not elsewhere classified: Secondary | ICD-10-CM | POA: Insufficient documentation

## 2024-03-29 DIAGNOSIS — M6281 Muscle weakness (generalized): Secondary | ICD-10-CM | POA: Diagnosis not present

## 2024-03-29 DIAGNOSIS — R2689 Other abnormalities of gait and mobility: Secondary | ICD-10-CM | POA: Diagnosis not present

## 2024-03-29 NOTE — Therapy (Addendum)
 OUTPATIENT PHYSICAL THERAPY LOWER EXTREMITY EVALUATION   Patient Name: Beth [name] MRN: [id number] DOB:1956/11/16, 68 y.o., female Today's Date: 03/29/2024  END OF SESSION:  PT End of Session - 03/29/24 1525     Visit Number 1    Number of Visits 12    Date for PT Re-Evaluation 05/10/24    Authorization Type [employer]    PT Start Time 1445    PT Stop Time 1530    PT Time Calculation (min) 45 min    Activity Tolerance Patient tolerated treatment well    Behavior During Therapy WFL for tasks assessed/performed             Past Medical History:  Diagnosis Date   Anxiety    Arthritis    Depression    Sleep apnea    does not uses CPAP; uses mouth guard.    Past Surgical History:  Procedure Laterality Date   ANKLE SURGERY Right 2008   BACK SURGERY  2009   BUNIONECTOMY Right    COLONOSCOPY     COSMETIC SURGERY     JOINT REPLACEMENT  '13, '21   r knee, r shoulder   NISSEN FUNDOPLICATION     REPLACEMENT TOTAL KNEE Right 2012   SPINE SURGERY  '09   back   TONSILLECTOMY     TOTAL KNEE ARTHROPLASTY Left 03/09/2024   Procedure: ARTHROPLASTY, KNEE, TOTAL;  Surgeon: Beth Lao, MD;  Location: [facility] OR;  Service: Orthopedics;  Laterality: Left;   TOTAL SHOULDER ARTHROPLASTY Right 03/28/2020   Procedure: RIGHT SHOULDER REPLACEMENT;  Surgeon: Beth Mesi, MD;  Location: [facility] OR;  Service: Orthopedics;  Laterality: Right;   Patient Active Problem List   Diagnosis Date Noted   Status post total left knee replacement 03/09/2024   Morbid obesity (HCC) 07/22/2023   Preventative health care 07/22/2023   Onychomycosis 01/14/2023   Eczema 01/14/2023   Hypertriglyceridemia 07/17/2022   Hyperglycemia 07/17/2022   Bilateral primary osteoarthritis of knee 01/17/2022   Chronic pain of left knee 01/15/2022   Bilateral carpal tunnel syndrome 01/15/2022   Microscopic hematuria 03/04/2021   OSA (obstructive sleep apnea) 07/27/2020   Insomnia 04/25/2020    Restless leg 04/25/2020   Depression 04/25/2020   Arthritis of shoulder 03/28/2020   Primary osteoarthritis, right shoulder 01/11/2020    PCP: Beth Gaucher, NP  REFERRING PROVIDER: Arnie Lao, MD  REFERRING DIAG: [phone] (ICD-10-CM) - Unilateral primary osteoarthritis, left knee [phone] (ICD-10-CM) - Status post total left knee replacement  THERAPY DIAG:  Chronic pain of left knee  Stiffness of left knee, not elsewhere classified  Muscle weakness (generalized)  Localized edema  Other abnormalities of gait and mobility  Unilateral primary osteoarthritis, left knee  Status post total left knee replacement  Rationale for Evaluation and Treatment: Rehabilitation  ONSET DATE: S/P Left TKA on 03/09/24 with Dr. [name]  SUBJECTIVE:   SUBJECTIVE STATEMENT: Pt states she has been doing HHPT and has been doing her exercises. Has been icing. Walking in the house. Has no longer been using a/d. Still has some difficulty with sitting in lower chairs. Hard to get comfortable sleeping. Was going to the gym prior to the surgery and wants to return and lose weight. Was doing Nustep. Improve walking so she can get around [employer]. Wants to be able to shop without any issues.   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 Yes: NPRS scale: 3/10  currently, 7/10 at worst  Pain location: Front of L knee Pain description: numbness, can get throbbing Aggravating factors: "If I've over done it" Relieving factors: Icing, tylenol   PRECAUTIONS: None  RED FLAGS: None   WEIGHT BEARING RESTRICTIONS: No  FALLS:  Has patient fallen in last 6 months? 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  DIAGNOSTIC FINDINGS: n/a  PATIENT SURVEYS:  LEFS: 39/80 = 48.75%  COGNITION: Overall cognitive status: Within functional limits for tasks assessed     SENSATION: Reports N/T in anterior knee  EDEMA:  Mild to moderate edema around L knee Sutures appear well, still has steri strips with some beginnings of scabbing  MUSCLE LENGTH: Hamstrings: See knee ROM below Andy Bannister test: Did not assess  POSTURE: No Significant postural limitations  PALPATION: No overt tenderness to palpation  LOWER EXTREMITY ROM:  Active ROM Right eval Left eval  Hip flexion    Hip extension    Hip abduction    Hip adduction    Hip internal rotation    Hip external rotation    Knee flexion A: 115 P: 115 A: 108 P: 110  Knee extension A: 0 P: 0 A: -10  P: -3  Ankle dorsiflexion    Ankle plantarflexion    Ankle inversion    Ankle eversion     (Blank rows = not tested)  LOWER EXTREMITY MMT:  MMT Right eval Left eval  Hip flexion 5 5  Hip extension 4 3+  Hip abduction 5 4  Hip adduction    Hip internal rotation    Hip external rotation    Knee flexion 5 4+ within available range  Knee extension 5 4+  Ankle dorsiflexion    Ankle plantarflexion    Ankle inversion    Ankle eversion     (Blank rows = not tested)  LOWER EXTREMITY SPECIAL TESTS:  Did not assess  FUNCTIONAL TESTS:  5 times sit to stand: 8.5 sec Berg Balance Scale: 52/56 Functional gait assessment: 24/30   [facility] PT Assessment - 03/29/24 0001       Standardized Balance Assessment   Standardized Balance Assessment Berg Balance Test      Berg Balance Test   Sit to Stand Able to stand without using hands and stabilize independently    Standing Unsupported Able to stand safely 2 minutes    Sitting with Back Unsupported but Feet Supported on Floor or Stool Able to sit safely and securely 2 minutes     Stand to Sit Sits safely with minimal use of hands    Transfers Able to transfer safely, minor use of hands    Standing Unsupported with Eyes Closed Able to stand 10 seconds safely    Standing Unsupported with Feet Together Able to place feet together independently and stand 1 minute safely    From Standing, Reach Forward with Outstretched Arm Can reach confidently >25 cm (10")    From Standing Position, Pick up Object from Floor Able to pick up shoe safely and easily    From Standing Position, Turn to Look Behind Over each Shoulder Looks behind from both sides and weight shifts well    Turn 360  Degrees Able to turn 360 degrees safely but slowly    Standing Unsupported, Alternately Place Feet on Step/Stool Able to stand independently and safely and complete 8 steps in 20 seconds    Standing Unsupported, One Foot in Front Able to plae foot ahead of the other independently and hold 30 seconds    Standing on One Leg Able to lift leg independently and hold 5-10 seconds   R = 5 sec, L = 7 sec   Total Score 52    Berg comment: 52/56      Functional Gait  Assessment   Gait assessed  Yes    Gait Level Surface Walks 20 ft in less than 7 sec but greater than 5.5 sec, uses assistive device, slower speed, mild gait deviations, or deviates 6-10 in outside of the 12 in walkway width.    Change in Gait Speed Able to smoothly change walking speed without loss of balance or gait deviation. Deviate no more than 6 in outside of the 12 in walkway width.    Gait with Horizontal Head Turns Performs head turns smoothly with no change in gait. Deviates no more than 6 in outside 12 in walkway width    Gait with Vertical Head Turns Performs head turns with no change in gait. Deviates no more than 6 in outside 12 in walkway width.    Gait and Pivot Turn Pivot turns safely within 3 sec and stops quickly with no loss of balance.    Step Over Obstacle Is able to step over one shoe box (4.5 in total height) without  changing gait speed. No evidence of imbalance.    Gait with Narrow Base of Support Ambulates 7-9 steps.    Gait with Eyes Closed Walks 20 ft, uses assistive device, slower speed, mild gait deviations, deviates 6-10 in outside 12 in walkway width. Ambulates 20 ft in less than 9 sec but greater than 7 sec.    Ambulating Backwards Walks 20 ft, no assistive devices, good speed, no evidence for imbalance, normal gait    Steps Two feet to a stair, must use rail.    Total Score 24              GAIT: Distance walked: Into clinic Assistive device utilized: None Level of assistance: Complete Independence Comments: Mildly antalgic especially with first few steps, decreased knee flexion during swing. Improves after a few steps into normal reciprocal gait pattern                                                                                                                                TREATMENT DATE: 03/29/24 Vaso x10 min, L knee low compression, 34 deg   PATIENT EDUCATION:  Education details: Exam findings and POC Person educated: Patient Education method: Explanation, Demonstration, and Handouts Education comprehension: verbalized understanding, returned demonstration, and needs further education  HOME EXERCISE PROGRAM: TBA  ASSESSMENT:  CLINICAL IMPRESSION: Patient is a  68 y.o. F who was seen today for physical therapy evaluation and treatment for L TKA on 03/09/24. PMH significant for prior R TKA and R post tib tendon rupture. Assessment significant for reduced L knee ROM, L>R LE weakness, and decreased balance/stability for community tasks. Pt is doing very well post op and no longer requiring use of a/d. Pt will benefit from PT to address these issues to reach her goals for weight loss and return to gym activities.   OBJECTIVE IMPAIRMENTS: Abnormal gait, decreased activity tolerance, decreased balance, decreased mobility, difficulty walking, decreased ROM, decreased strength,  hypomobility, increased edema, increased fascial restrictions, impaired flexibility, impaired sensation, improper body mechanics, postural dysfunction, and pain.   ACTIVITY LIMITATIONS: carrying, lifting, bending, standing, squatting, sleeping, stairs, transfers, bed mobility, toileting, hygiene/grooming, and locomotion level  PARTICIPATION LIMITATIONS: meal prep, cleaning, laundry, shopping, community activity, and yard work  PERSONAL FACTORS: Age, Fitness, Past/current experiences, and Time since onset of injury/illness/exacerbation are also affecting patient's functional outcome.   REHAB POTENTIAL: Good  CLINICAL DECISION MAKING: Evolving/moderate complexity  EVALUATION COMPLEXITY: Moderate   GOALS: Goals reviewed with patient? Yes   SHORT TERM GOALS: Target date: 04/19/2024  Independent with initial HEP. Baseline: Will provide next session Goal status: INITIAL  2.  Pt will have improved L knee AROM from 5 to 110 deg Baseline: 10-108 deg Goal status: INITIAL   LONG TERM GOALS: Target date: 05/10/2024  Independent with advanced/ongoing HEP to improve outcomes and carryover.  Baseline:  Goal status: INITIAL  2.  Lavonda Pour will demonstrate L knee flexion to 115 deg to ascend/descend stairs. Baseline: 110 PROM, 108 AROM on eval Goal status: INITIAL  3.  Lavonda Pour will demonstrate full L knee extension for safety with gait. Baseline: 5 deg from full ext PROM, 10 deg from full ext on eval AROM Goal status: INITIAL  4.  Alexiya Franqui will be able to ambulate 600' safely with LRAD and normal gait pattern to access community.  Baseline: Mostly in the house only without a/d Goal status: INITIAL  5.  Hoda Hon will be able to ascend/descend stairs with 1 HR and reciprocal step pattern safely to access home and community.  Baseline: Requires step to pattern with hand rail  Goal status: INITIAL  6.  Lavonda Pour will demonstrate > 28/30 on  FGA to demonstrate decreased risk of falls.   Baseline: 24/30 Goal status: INITIAL   8.  Lavonda Pour will demonstrate 48/80 on LEFS to demonstrate MCID.  Baseline: 39/80 Goal status: INITIAL    PLAN:  PT FREQUENCY: 2x/week  PT DURATION: 6 weeks  PLANNED INTERVENTIONS: 91478- PT Re-evaluation, 97110-Therapeutic exercises, 97530- Therapeutic activity, 97112- Neuromuscular re-education, 97535- Self Care, 29562- Manual therapy, [phone]- Gait training, [phone]- Aquatic Therapy, [phone]- Electrical stimulation (unattended), 97016- Vasopneumatic device, 97033- Ionotophoresis 4mg /ml Dexamethasone , Patient/Family education, Balance training, Stair training, Taping, Dry Needling, Joint mobilization, Spinal mobilization, Cryotherapy, and Moist heat  PLAN FOR NEXT SESSION: Update pt's HEP to include resistance bands and 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 Work on endurance, ROM, balance, and progressive strengthening on L LE.    Taiten Brawn April Ma L Quamere Mussell, PT 03/29/2024, 4:24 PM

## 2024-03-31 ENCOUNTER — Ambulatory Visit: Payer: Self-pay

## 2024-03-31 DIAGNOSIS — R2689 Other abnormalities of gait and mobility: Secondary | ICD-10-CM | POA: Diagnosis not present

## 2024-03-31 DIAGNOSIS — R600 Localized edema: Secondary | ICD-10-CM | POA: Diagnosis not present

## 2024-03-31 DIAGNOSIS — M1712 Unilateral primary osteoarthritis, left knee: Secondary | ICD-10-CM

## 2024-03-31 DIAGNOSIS — M6281 Muscle weakness (generalized): Secondary | ICD-10-CM

## 2024-03-31 DIAGNOSIS — Z96652 Presence of left artificial knee joint: Secondary | ICD-10-CM

## 2024-03-31 DIAGNOSIS — M25662 Stiffness of left knee, not elsewhere classified: Secondary | ICD-10-CM | POA: Diagnosis not present

## 2024-03-31 DIAGNOSIS — G8929 Other chronic pain: Secondary | ICD-10-CM | POA: Diagnosis not present

## 2024-03-31 DIAGNOSIS — M25562 Pain in left knee: Secondary | ICD-10-CM | POA: Diagnosis not present

## 2024-03-31 NOTE — Therapy (Signed)
 OUTPATIENT PHYSICAL THERAPY LOWER EXTREMITY EVALUATION   Patient Name: Alsace Dowd MRN: [id number] DOB:1956-04-25, 68 y.o., female Today's Date: 03/31/2024  END OF SESSION:  PT End of Session - 03/31/24 1403     Visit Number 2    Number of Visits 12    Date for PT Re-Evaluation 05/10/24    Authorization Type [employer]    PT Start Time 1315    PT Stop Time 1416    PT Time Calculation (min) 61 min    Activity Tolerance Patient tolerated treatment well    Behavior During Therapy WFL for tasks assessed/performed              Past Medical History:  Diagnosis Date   Anxiety    Arthritis    Depression    Sleep apnea    does not uses CPAP; uses mouth guard.    Past Surgical History:  Procedure Laterality Date   ANKLE SURGERY Right 2008   BACK SURGERY  2009   BUNIONECTOMY Right    COLONOSCOPY     COSMETIC SURGERY     JOINT REPLACEMENT  '13, '21   r knee, r shoulder   NISSEN FUNDOPLICATION     REPLACEMENT TOTAL KNEE Right 2012   SPINE SURGERY  '09   back   TONSILLECTOMY     TOTAL KNEE ARTHROPLASTY Left 03/09/2024   Procedure: ARTHROPLASTY, KNEE, TOTAL;  Surgeon: Arnie Lao, MD;  Location: [facility] OR;  Service: Orthopedics;  Laterality: Left;   TOTAL SHOULDER ARTHROPLASTY Right 03/28/2020   Procedure: RIGHT SHOULDER REPLACEMENT;  Surgeon: Jasmine Mesi, MD;  Location: [facility] OR;  Service: Orthopedics;  Laterality: Right;   Patient Active Problem List   Diagnosis Date Noted   Status post total left knee replacement 03/09/2024   Morbid obesity (HCC) 07/22/2023   Preventative health care 07/22/2023   Onychomycosis 01/14/2023   Eczema 01/14/2023   Hypertriglyceridemia 07/17/2022   Hyperglycemia 07/17/2022   Bilateral primary osteoarthritis of knee 01/17/2022   Chronic pain of left knee 01/15/2022   Bilateral carpal tunnel syndrome 01/15/2022   Microscopic hematuria 03/04/2021   OSA (obstructive sleep apnea) 07/27/2020   Insomnia 04/25/2020    Restless leg 04/25/2020   Depression 04/25/2020   Arthritis of shoulder 03/28/2020   Primary osteoarthritis, right shoulder 01/11/2020    PCP: Dorrene Gaucher, NP  REFERRING PROVIDER: Arnie Lao, MD  REFERRING DIAG: [phone] (ICD-10-CM) - Unilateral primary osteoarthritis, left knee [phone] (ICD-10-CM) - Status post total left knee replacement  THERAPY DIAG:  Chronic pain of left knee  Stiffness of left knee, not elsewhere classified  Muscle weakness (generalized)  Localized edema  Other abnormalities of gait and mobility  Unilateral primary osteoarthritis, left knee  Status post total left knee replacement  Rationale for Evaluation and Treatment: Rehabilitation  ONSET DATE: S/P Left TKA on 03/09/24 with Dr. [name]  SUBJECTIVE:   SUBJECTIVE STATEMENT: Pt reports she is a little tight today.  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 Yes: NPRS scale: 3/10 currently, 7/10 at worst  Pain location: Front of L knee Pain description: numbness, can get throbbing Aggravating factors: "If I've over done it" Relieving factors: Icing, tylenol   PRECAUTIONS: None  RED FLAGS: None   WEIGHT BEARING RESTRICTIONS: No  FALLS:  Has patient fallen in last 6 months? No  LIVING ENVIRONMENT: Lives with: lives with their spouse (trucker) Lives in: House/apartment Stairs: A few  stairs to enter and holds on to pole Has following equipment at home: None  OCCUPATION: Retired; mostly house work  PLOF: Independent  PATIENT GOALS: Improve walkin  NEXT MD VISIT: n/a  OBJECTIVE:  Note: Objective measures were completed at Evaluation unless otherwise noted.  DIAGNOSTIC FINDINGS: n/a  PATIENT SURVEYS:  LEFS: 39/80 = 48.75%  COGNITION: Overall cognitive status: Within functional limits for tasks assessed     SENSATION: Reports N/T in anterior knee  EDEMA:  Mild to moderate edema around L  knee Sutures appear well, still has steri strips with some beginnings of scabbing  MUSCLE LENGTH: Hamstrings: See knee ROM below Andy Bannister test: Did not assess  POSTURE: No Significant postural limitations  PALPATION: No overt tenderness to palpation  LOWER EXTREMITY ROM:  Active ROM Right eval Left eval  Hip flexion    Hip extension    Hip abduction    Hip adduction    Hip internal rotation    Hip external rotation    Knee flexion A: 115 P: 115 A: 108 P: 110  Knee extension A: 0 P: 0 A: -10  P: -3  Ankle dorsiflexion    Ankle plantarflexion    Ankle inversion    Ankle eversion     (Blank rows = not tested)  LOWER EXTREMITY MMT:  MMT Right eval Left eval  Hip flexion 5 5  Hip extension 4 3+  Hip abduction 5 4  Hip adduction    Hip internal rotation    Hip external rotation    Knee flexion 5 4+ within available range  Knee extension 5 4+  Ankle dorsiflexion    Ankle plantarflexion    Ankle inversion    Ankle eversion     (Blank rows = not tested)  LOWER EXTREMITY SPECIAL TESTS:  Did not assess  FUNCTIONAL TESTS:  5 times sit to stand: 8.5 sec Berg Balance Scale: 52/56 Functional gait assessment: 24/30      GAIT: Distance walked: Into clinic Assistive device utilized: None Level of assistance: Complete Independence Comments: Mildly antalgic especially with first few steps, decreased knee flexion during swing. Improves after a few steps into normal reciprocal gait pattern                                                                                                                                TREATMENT DATE:  03/31/24 Recumbent Bike x 6 min Squats 10x3" 4 way hip RTB looped attached to furniture x 10 B Seated LAQ 2lb x 10 B Seated HS curls 2x10 RTB Supine SLR x 20 Vaso x15 min, L knee low compression, 34 deg  03/29/24 Vaso x10 min, L knee low compression, 34 deg   PATIENT EDUCATION:  Education details: Exam findings and POC Person  educated: Patient Education method: Explanation, Demonstration, and Handouts Education comprehension: verbalized understanding, returned demonstration, and needs further education  HOME EXERCISE PROGRAM: Access Code: [email or web address] URL: [email or web address] Date: 03/31/2024 Prepared  by: Rodell Marrs  Exercises - Squat with Counter Support  - 1 x daily - 3 x weekly - 2 sets - 10 reps - 3 second hold - Standing Hip Extension with Resistance  - 1 x daily - 3 x weekly - 2 sets - 10 reps - Standing Hip Adduction with Resistance  - 1 x daily - 3 x weekly - 2 sets - 10 reps - Standing Hip Flexion with Resistance  - 1 x daily - 3 x weekly - 2 sets - 10 reps - Standing Hip Abduction with Resistance  - 1 x daily - 3 x weekly - 2 sets - 10 reps - Sitting Knee Extension with Resistance  - 1 x daily - 3 x weekly - 2 sets - 10 reps  ASSESSMENT:  CLINICAL IMPRESSION: Patient is a 68 y.o. F who was seen today for physical therapy treatment for L TKA on 03/09/24. Progressed her HEP with resistance band hip strengthening and knee strengthening. Cues provided throughout session as needed to correct form and target the correct muscles. GR post session to address swelling.   OBJECTIVE IMPAIRMENTS: Abnormal gait, decreased activity tolerance, decreased balance, decreased mobility, difficulty walking, decreased ROM, decreased strength, hypomobility, increased edema, increased fascial restrictions, impaired flexibility, impaired sensation, improper body mechanics, postural dysfunction, and pain.   ACTIVITY LIMITATIONS: carrying, lifting, bending, standing, squatting, sleeping, stairs, transfers, bed mobility, toileting, hygiene/grooming, and locomotion level  PARTICIPATION LIMITATIONS: meal prep, cleaning, laundry, shopping, community activity, and yard work  PERSONAL FACTORS: Age, Fitness, Past/current experiences, and Time since onset of injury/illness/exacerbation are also affecting patient's  functional outcome.   REHAB POTENTIAL: Good  CLINICAL DECISION MAKING: Evolving/moderate complexity  EVALUATION COMPLEXITY: Moderate   GOALS: Goals reviewed with patient? Yes   SHORT TERM GOALS: Target date: 04/19/2024  Independent with initial HEP. Baseline: Will provide next session Goal status: INITIAL  2.  Pt will have improved L knee AROM from 5 to 110 deg Baseline: 10-108 deg Goal status: INITIAL   LONG TERM GOALS: Target date: 05/10/2024  Independent with advanced/ongoing HEP to improve outcomes and carryover.  Baseline:  Goal status: INITIAL  2.  Lavonda Pour will demonstrate L knee flexion to 115 deg to ascend/descend stairs. Baseline: 110 PROM, 108 AROM on eval Goal status: INITIAL  3.  Lavonda Pour will demonstrate full L knee extension for safety with gait. Baseline: 5 deg from full ext PROM, 10 deg from full ext on eval AROM Goal status: INITIAL  4.  Shaindel Sweeten will be able to ambulate 600' safely with LRAD and normal gait pattern to access community.  Baseline: Mostly in the house only without a/d Goal status: INITIAL  5.  Kamyah Wilhelmsen will be able to ascend/descend stairs with 1 HR and reciprocal step pattern safely to access home and community.  Baseline: Requires step to pattern with hand rail  Goal status: INITIAL  6.  Lavonda Pour will demonstrate > 28/30 on FGA to demonstrate decreased risk of falls.   Baseline: 24/30 Goal status: INITIAL   8.  Lavonda Pour will demonstrate 48/80 on LEFS to demonstrate MCID.  Baseline: 39/80 Goal status: INITIAL    PLAN:  PT FREQUENCY: 2x/week  PT DURATION: 6 weeks  PLANNED INTERVENTIONS: 97164- PT Re-evaluation, 97110-Therapeutic exercises, 97530- Therapeutic activity, 97112- Neuromuscular re-education, 97535- Self Care, 40981- Manual therapy, [phone]- Gait training, [phone]- Aquatic Therapy, [phone]- Electrical stimulation (unattended), 97016- Vasopneumatic device, [phone]-  Ionotophoresis 4mg /ml Dexamethasone , Patient/Family education, Balance training,  Stair training, Taping, Dry Needling, Joint mobilization, Spinal mobilization, Cryotherapy, and Moist heat  PLAN FOR NEXT SESSION: Update pt's HEP to include resistance bands and 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 Work on endurance, ROM, balance, and progressive strengthening on L LE.    Yenni Carra Petra Brandy, PTA 03/31/2024, 3:23 PM

## 2024-04-02 DIAGNOSIS — Z6841 Body Mass Index (BMI) 40.0 and over, adult: Secondary | ICD-10-CM | POA: Diagnosis not present

## 2024-04-02 DIAGNOSIS — Z7182 Exercise counseling: Secondary | ICD-10-CM | POA: Diagnosis not present

## 2024-04-05 ENCOUNTER — Ambulatory Visit: Admitting: Physical Therapy

## 2024-04-05 DIAGNOSIS — R600 Localized edema: Secondary | ICD-10-CM | POA: Diagnosis not present

## 2024-04-05 DIAGNOSIS — M1712 Unilateral primary osteoarthritis, left knee: Secondary | ICD-10-CM | POA: Diagnosis not present

## 2024-04-05 DIAGNOSIS — R2689 Other abnormalities of gait and mobility: Secondary | ICD-10-CM | POA: Diagnosis not present

## 2024-04-05 DIAGNOSIS — Z96652 Presence of left artificial knee joint: Secondary | ICD-10-CM | POA: Diagnosis not present

## 2024-04-05 DIAGNOSIS — M25662 Stiffness of left knee, not elsewhere classified: Secondary | ICD-10-CM

## 2024-04-05 DIAGNOSIS — M25562 Pain in left knee: Secondary | ICD-10-CM | POA: Diagnosis not present

## 2024-04-05 DIAGNOSIS — G8929 Other chronic pain: Secondary | ICD-10-CM | POA: Diagnosis not present

## 2024-04-05 DIAGNOSIS — M6281 Muscle weakness (generalized): Secondary | ICD-10-CM

## 2024-04-05 NOTE — Therapy (Signed)
 OUTPATIENT PHYSICAL THERAPY LOWER EXTREMITY TREATMENT   Patient Name: Beth [name] MRN: [id number] DOB:[birth year], 68 y.o., female Today's Date: 04/05/2024  END OF SESSION:  PT End of Session - 04/05/24 1450     Visit Number 3    Number of Visits 12    Date for PT Re-Evaluation 05/10/24    Authorization Type [employer]    PT Start Time 1447    PT Stop Time 1530    PT Time Calculation (min) 43 min    Activity Tolerance Patient tolerated treatment well    Behavior During Therapy WFL for tasks assessed/performed               Past Medical History:  Diagnosis Date   Anxiety    Arthritis    Depression    Sleep apnea    does not uses CPAP; uses mouth guard.    Past Surgical History:  Procedure Laterality Date   ANKLE SURGERY Right 2008   BACK SURGERY  2009   BUNIONECTOMY Right    COLONOSCOPY     COSMETIC SURGERY     JOINT REPLACEMENT  '13, '21   r knee, r shoulder   NISSEN FUNDOPLICATION     REPLACEMENT TOTAL KNEE Right 2012   SPINE SURGERY  '09   back   TONSILLECTOMY     TOTAL KNEE ARTHROPLASTY Left 03/09/2024   Procedure: ARTHROPLASTY, KNEE, TOTAL;  Surgeon: Arnie Lao, MD;  Location: [facility] OR;  Service: Orthopedics;  Laterality: Left;   TOTAL SHOULDER ARTHROPLASTY Right 03/28/2020   Procedure: RIGHT SHOULDER REPLACEMENT;  Surgeon: Jasmine Mesi, MD;  Location: [facility] OR;  Service: Orthopedics;  Laterality: Right;   Patient Active Problem List   Diagnosis Date Noted   Status post total left knee replacement 03/09/2024   Morbid obesity (HCC) 07/22/2023   Preventative health care 07/22/2023   Onychomycosis 01/14/2023   Eczema 01/14/2023   Hypertriglyceridemia 07/17/2022   Hyperglycemia 07/17/2022   Bilateral primary osteoarthritis of knee 01/17/2022   Chronic pain of left knee 01/15/2022   Bilateral carpal tunnel syndrome 01/15/2022   Microscopic hematuria 03/04/2021   OSA (obstructive sleep apnea) 07/27/2020   Insomnia 04/25/2020    Restless leg 04/25/2020   Depression 04/25/2020   Arthritis of shoulder 03/28/2020   Primary osteoarthritis, right shoulder 01/11/2020    PCP: Dorrene Gaucher, NP  REFERRING PROVIDER: Arnie Lao, MD  REFERRING DIAG: [phone] (ICD-10-CM) - Unilateral primary osteoarthritis, left knee [phone] (ICD-10-CM) - Status post total left knee replacement  THERAPY DIAG:  No diagnosis found.  Rationale for Evaluation and Treatment: Rehabilitation  ONSET DATE: S/P Left TKA on 03/09/24 with Dr. [name]  SUBJECTIVE:   SUBJECTIVE STATEMENT: Pt states she was a little sore after last session so did her exercises the day after.   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 Yes: NPRS scale: 3/10 currently, 7/10 at worst  Pain location: Front of L knee Pain description: numbness, can get throbbing Aggravating factors: "If I've over done it" Relieving factors: Icing, tylenol   PRECAUTIONS: None  RED FLAGS: None   WEIGHT BEARING RESTRICTIONS: No  FALLS:  Has patient fallen in last 6 months? 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  DIAGNOSTIC FINDINGS: n/a  PATIENT SURVEYS:  LEFS: 39/80 = 48.75%  COGNITION: Overall cognitive status: Within functional limits for tasks assessed     SENSATION: Reports N/T in anterior knee  EDEMA:  Mild to moderate edema around L knee Sutures appear well, still has steri strips with some beginnings of scabbing  MUSCLE LENGTH: Hamstrings: See knee ROM below Andy Bannister test: Did not assess  POSTURE: No Significant postural  limitations  PALPATION: No overt tenderness to palpation  LOWER EXTREMITY ROM:  Active ROM Right eval Left eval  Hip flexion    Hip extension    Hip abduction    Hip adduction    Hip internal rotation    Hip external rotation    Knee flexion A: 115 P: 115 A: 108 P: 110  Knee extension A: 0 P: 0 A: -10  P: -3  Ankle dorsiflexion    Ankle plantarflexion    Ankle inversion    Ankle eversion     (Blank rows = not tested)  LOWER EXTREMITY MMT:  MMT Right eval Left eval  Hip flexion 5 5  Hip extension 4 3+  Hip abduction 5 4  Hip adduction    Hip internal rotation    Hip external rotation    Knee flexion 5 4+ within available range  Knee extension 5 4+  Ankle dorsiflexion    Ankle plantarflexion    Ankle inversion    Ankle eversion     (Blank rows = not tested)  LOWER EXTREMITY SPECIAL TESTS:  Did not assess  FUNCTIONAL TESTS:  5 times sit to stand: 8.5 sec Berg Balance Scale: 52/56 Functional gait assessment: 24/30      GAIT: Distance walked: Into clinic Assistive device utilized: None Level of assistance: Complete Independence Comments: Mildly antalgic especially with first few steps, decreased knee flexion during swing. Improves after a few steps into normal reciprocal gait pattern                                                                                                                                TREATMENT DATE:  04/05/24 Recumbent bike partial revolutions to full revolutions x 6 min Tall kneel + hip ext x10 Half kneel into knee flexion on L x10 4 way hip RTB looped attached to furniture 2x 10 B Heel/toe raise 2x10 Single leg clock reach x10, on airex x10 Hamstring curl 35# 2x10 Knee ext 25# 2x10 Fwd and bwd amb x2 laps around gym Vaso x15 min, L knee low compression, 34 deg  03/31/24 Recumbent Bike x 6 min Squats 10x3" 4 way hip RTB looped attached to furniture x 10 B Seated LAQ 2lb x 10 B Seated HS curls 2x10 RTB Supine SLR  x 20 Vaso x15 min, L knee low compression, 34 deg  03/29/24 Vaso x10 min, L  knee low compression, 34 deg   PATIENT EDUCATION:  Education details: Exam findings and POC Person educated: Patient Education method: Explanation, Demonstration, and Handouts Education comprehension: verbalized understanding, returned demonstration, and needs further education  HOME EXERCISE PROGRAM: Access Code: [email or web address] URL: [email or web address] Date: 03/31/2024 Prepared by: Braylin Clark  Exercises - Squat with Counter Support  - 1 x daily - 3 x weekly - 2 sets - 10 reps - 3 second hold - Standing Hip Extension with Resistance  - 1 x daily - 3 x weekly - 2 sets - 10 reps - Standing Hip Adduction with Resistance  - 1 x daily - 3 x weekly - 2 sets - 10 reps - Standing Hip Flexion with Resistance  - 1 x daily - 3 x weekly - 2 sets - 10 reps - Standing Hip Abduction with Resistance  - 1 x daily - 3 x weekly - 2 sets - 10 reps - Sitting Knee Extension with Resistance  - 1 x daily - 3 x weekly - 2 sets - 10 reps  ASSESSMENT:  CLINICAL IMPRESSION: Patient is a 68 y.o. F who was seen today for physical therapy treatment for L TKA on 03/09/24. Continued to progress knee ROM, weight bearing, balance and strength this session. Some crepitus when performing open chain knee flex/ext but otherwise no increased pain with movement. Able to tolerate tall kneeling position for <5 min.  OBJECTIVE IMPAIRMENTS: Abnormal gait, decreased activity tolerance, decreased balance, decreased mobility, difficulty walking, decreased ROM, decreased strength, hypomobility, increased edema, increased fascial restrictions, impaired flexibility, impaired sensation, improper body mechanics, postural dysfunction, and pain.   ACTIVITY LIMITATIONS: carrying, lifting, bending, standing, squatting, sleeping, stairs, transfers, bed mobility, toileting, hygiene/grooming, and locomotion level  PARTICIPATION LIMITATIONS: meal prep,  cleaning, laundry, shopping, community activity, and yard work  PERSONAL FACTORS: Age, Fitness, Past/current experiences, and Time since onset of injury/illness/exacerbation are also affecting patient's functional outcome.   REHAB POTENTIAL: Good  CLINICAL DECISION MAKING: Evolving/moderate complexity  EVALUATION COMPLEXITY: Moderate   GOALS: Goals reviewed with patient? Yes   SHORT TERM GOALS: Target date: 04/19/2024  Independent with initial HEP. Baseline: Will provide next session Goal status: INITIAL  2.  Pt will have improved L knee AROM from 5 to 110 deg Baseline: 10-108 deg Goal status: INITIAL   LONG TERM GOALS: Target date: 05/10/2024  Independent with advanced/ongoing HEP to improve outcomes and carryover.  Baseline:  Goal status: INITIAL  2.  Lavonda Pour will demonstrate L knee flexion to 115 deg to ascend/descend stairs. Baseline: 110 PROM, 108 AROM on eval Goal status: INITIAL  3.  Lavonda Pour will demonstrate full L knee extension for safety with gait. Baseline: 5 deg from full ext PROM, 10 deg from full ext on eval AROM Goal status: INITIAL  4.  Keylen Kagan will be able to ambulate 600' safely with LRAD and normal gait pattern to access community.  Baseline: Mostly in the house only without a/d Goal status: INITIAL  5.  Altagracia Felgar will be able to ascend/descend stairs with 1 HR and reciprocal step pattern safely to access home and community.  Baseline: Requires step to pattern with hand rail  Goal status: INITIAL  6.  Lavonda Pour will demonstrate > 28/30 on FGA to demonstrate decreased risk of falls.   Baseline: 24/30 Goal status: INITIAL   8.  Lavonda Pour will demonstrate 48/80 on LEFS to demonstrate MCID.  Baseline: 39/80 Goal status: INITIAL    PLAN:  PT FREQUENCY: 2x/week  PT DURATION: 6 weeks  PLANNED INTERVENTIONS: 97164- PT Re-evaluation, 97110-Therapeutic exercises, 97530- Therapeutic  activity, 97112- Neuromuscular re-education, 97535- Self Care, 62130- Manual therapy, [phone]- Gait training, [phone]- Aquatic Therapy, [phone]- Electrical stimulation (unattended), 97016- Vasopneumatic device, [phone]- Ionotophoresis 4mg /ml Dexamethasone , Patient/Family education, Balance training, Stair training, Taping, Dry Needling, Joint mobilization, Spinal mobilization, Cryotherapy, and Moist heat  PLAN FOR NEXT SESSION: Update pt's HEP accordingly. Work on endurance, ROM, balance, and progressive strengthening on L LE.    Bobbijo Holst April Ma L Racheal Mathurin, PT 04/05/2024, 2:51 PM

## 2024-04-07 ENCOUNTER — Ambulatory Visit

## 2024-04-07 DIAGNOSIS — G8929 Other chronic pain: Secondary | ICD-10-CM

## 2024-04-07 DIAGNOSIS — M1712 Unilateral primary osteoarthritis, left knee: Secondary | ICD-10-CM | POA: Diagnosis not present

## 2024-04-07 DIAGNOSIS — Z96652 Presence of left artificial knee joint: Secondary | ICD-10-CM | POA: Diagnosis not present

## 2024-04-07 DIAGNOSIS — M6281 Muscle weakness (generalized): Secondary | ICD-10-CM | POA: Diagnosis not present

## 2024-04-07 DIAGNOSIS — R600 Localized edema: Secondary | ICD-10-CM

## 2024-04-07 DIAGNOSIS — R2689 Other abnormalities of gait and mobility: Secondary | ICD-10-CM | POA: Diagnosis not present

## 2024-04-07 DIAGNOSIS — M25662 Stiffness of left knee, not elsewhere classified: Secondary | ICD-10-CM | POA: Diagnosis not present

## 2024-04-07 DIAGNOSIS — M25562 Pain in left knee: Secondary | ICD-10-CM | POA: Diagnosis not present

## 2024-04-07 NOTE — Addendum Note (Signed)
 Addended by: Koleen Perna L on: 04/07/2024 01:14 PM   Modules accepted: Orders

## 2024-04-07 NOTE — Therapy (Signed)
 OUTPATIENT PHYSICAL THERAPY LOWER EXTREMITY TREATMENT   Patient Name: Beth [name] MRN: [id number] DOB:1956/01/06, 68 y.o., female Today's Date: 04/07/2024  END OF SESSION:  PT End of Session - 04/07/24 1524     Visit Number 4    Number of Visits 12    Date for PT Re-Evaluation 05/10/24    Authorization Type [employer]    PT Start Time 1438    PT Stop Time 1533    PT Time Calculation (min) 55 min    Activity Tolerance Patient tolerated treatment well    Behavior During Therapy WFL for tasks assessed/performed                Past Medical History:  Diagnosis Date   Anxiety    Arthritis    Depression    Sleep apnea    does not uses CPAP; uses mouth guard.    Past Surgical History:  Procedure Laterality Date   ANKLE SURGERY Right 2008   BACK SURGERY  2009   BUNIONECTOMY Right    COLONOSCOPY     COSMETIC SURGERY     JOINT REPLACEMENT  '13, '21   r knee, r shoulder   NISSEN FUNDOPLICATION     REPLACEMENT TOTAL KNEE Right 2012   SPINE SURGERY  '09   back   TONSILLECTOMY     TOTAL KNEE ARTHROPLASTY Left 03/09/2024   Procedure: ARTHROPLASTY, KNEE, TOTAL;  Surgeon: Arnie Lao, MD;  Location: [facility] OR;  Service: Orthopedics;  Laterality: Left;   TOTAL SHOULDER ARTHROPLASTY Right 03/28/2020   Procedure: RIGHT SHOULDER REPLACEMENT;  Surgeon: Jasmine Mesi, MD;  Location: [facility] OR;  Service: Orthopedics;  Laterality: Right;   Patient Active Problem List   Diagnosis Date Noted   Status post total left knee replacement 03/09/2024   Morbid obesity (HCC) 07/22/2023   Preventative health care 07/22/2023   Onychomycosis 01/14/2023   Eczema 01/14/2023   Hypertriglyceridemia 07/17/2022   Hyperglycemia 07/17/2022   Bilateral primary osteoarthritis of knee 01/17/2022   Chronic pain of left knee 01/15/2022   Bilateral carpal tunnel syndrome 01/15/2022   Microscopic hematuria 03/04/2021   OSA (obstructive sleep apnea) 07/27/2020   Insomnia 04/25/2020    Restless leg 04/25/2020   Depression 04/25/2020   Arthritis of shoulder 03/28/2020   Primary osteoarthritis, right shoulder 01/11/2020    PCP: Dorrene Gaucher, NP  REFERRING PROVIDER: Arnie Lao, MD  REFERRING DIAG: [phone] (ICD-10-CM) - Unilateral primary osteoarthritis, left knee [phone] (ICD-10-CM) - Status post total left knee replacement  THERAPY DIAG:  Chronic pain of left knee  Stiffness of left knee, not elsewhere classified  Muscle weakness (generalized)  Localized edema  Other abnormalities of gait and mobility  Rationale for Evaluation and Treatment: Rehabilitation  ONSET DATE: S/P Left TKA on 03/09/24 with Dr. [name]  SUBJECTIVE:   SUBJECTIVE STATEMENT: Pt reports she is continuing to do well.   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 Yes: NPRS scale: 3/10 currently, 7/10 at worst  Pain location: Front of L knee Pain description: numbness, can get throbbing Aggravating factors: "If I've over done it" Relieving factors: Icing, tylenol   PRECAUTIONS: None  RED FLAGS: None   WEIGHT BEARING RESTRICTIONS: No  FALLS:  Has patient fallen in last 6 months? No  LIVING ENVIRONMENT: Lives with: lives with their spouse (trucker) Lives in: House/apartment Stairs: A few stairs to enter and holds on to pole Has following  equipment at home: None  OCCUPATION: Retired; mostly house work  PLOF: Independent  PATIENT GOALS: Improve walkin  NEXT MD VISIT: n/a  OBJECTIVE:  Note: Objective measures were completed at Evaluation unless otherwise noted.  DIAGNOSTIC FINDINGS: n/a  PATIENT SURVEYS:  LEFS: 39/80 = 48.75%  COGNITION: Overall cognitive status: Within functional limits for tasks assessed     SENSATION: Reports N/T in anterior knee  EDEMA:  Mild to moderate edema around L knee Sutures appear well, still has steri strips with some beginnings of scabbing  MUSCLE  LENGTH: Hamstrings: See knee ROM below Andy Bannister test: Did not assess  POSTURE: No Significant postural limitations  PALPATION: No overt tenderness to palpation  LOWER EXTREMITY ROM:  Active ROM Right eval Left eval R 04/07/24  Hip flexion     Hip extension     Hip abduction     Hip adduction     Hip internal rotation     Hip external rotation     Knee flexion A: 115 P: 115 A: 108 P: 110 A: 112 in hanging positoin  Knee extension A: 0 P: 0 A: -10  P: -3 A: 6 LAQ  Ankle dorsiflexion     Ankle plantarflexion     Ankle inversion     Ankle eversion      (Blank rows = not tested)  LOWER EXTREMITY MMT:  MMT Right eval Left eval  Hip flexion 5 5  Hip extension 4 3+  Hip abduction 5 4  Hip adduction    Hip internal rotation    Hip external rotation    Knee flexion 5 4+ within available range  Knee extension 5 4+  Ankle dorsiflexion    Ankle plantarflexion    Ankle inversion    Ankle eversion     (Blank rows = not tested)  LOWER EXTREMITY SPECIAL TESTS:  Did not assess  FUNCTIONAL TESTS:  5 times sit to stand: 8.5 sec Berg Balance Scale: 52/56 Functional gait assessment: 24/30      GAIT: Distance walked: Into clinic Assistive device utilized: None Level of assistance: Complete Independence Comments: Mildly antalgic especially with first few steps, decreased knee flexion during swing. Improves after a few steps into normal reciprocal gait pattern                                                                                                                                TREATMENT DATE:  04/07/24 Recumbent bike partial revolutions to full revolutions x 6 min Modified pigeon stretch x 30" B Squats x 10 - used mirror for visual feedback Single leg clock reach x5 on airex Hip extension on airex x 10 B intermittent UE support Hamstring curl 35# 2x10 Knee ext 25# 2x10 Supine AAROM heel slide using peanut ball x10 Vaso x15 min, L knee low compression, 34  deg  04/05/24 Recumbent bike partial revolutions to full revolutions x 6 min Tall kneel + hip ext x10  Half kneel into knee flexion on L x10 4 way hip RTB looped attached to furniture 2x 10 B Heel/toe raise 2x10 Single leg clock reach x10, on airex x10 Hamstring curl 35# 2x10 Knee ext 25# 2x10 Fwd and bwd amb x2 laps around gym Vaso x15 min, L knee low compression, 34 deg  03/31/24 Recumbent Bike x 6 min Squats 10x3" 4 way hip RTB looped attached to furniture x 10 B Seated LAQ 2lb x 10 B Seated HS curls 2x10 RTB Supine SLR x 20 Vaso x15 min, L knee low compression, 34 deg  03/29/24 Vaso x10 min, L knee low compression, 34 deg   PATIENT EDUCATION:  Education details: Exam findings and POC Person educated: Patient Education method: Explanation, Demonstration, and Handouts Education comprehension: verbalized understanding, returned demonstration, and needs further education  HOME EXERCISE PROGRAM: Access Code: [email or web address] URL: [email or web address] Date: 03/31/2024 Prepared by: Kerrion Kemppainen  Exercises - Squat with Counter Support  - 1 x daily - 3 x weekly - 2 sets - 10 reps - 3 second hold - Standing Hip Extension with Resistance  - 1 x daily - 3 x weekly - 2 sets - 10 reps - Standing Hip Adduction with Resistance  - 1 x daily - 3 x weekly - 2 sets - 10 reps - Standing Hip Flexion with Resistance  - 1 x daily - 3 x weekly - 2 sets - 10 reps - Standing Hip Abduction with Resistance  - 1 x daily - 3 x weekly - 2 sets - 10 reps - Sitting Knee Extension with Resistance  - 1 x daily - 3 x weekly - 2 sets - 10 reps  ASSESSMENT:  CLINICAL IMPRESSION: Patient is a 68 y.o. F who was seen today for physical therapy treatment for L TKA on 03/09/24. Pt tolerated the progression of exercises well with no increased pain. Continues to have some crepitius in L knee with open chain extension but no pain. Cues provided with intervnetions as needed. ROM is improving but extension  needs a little more stretching.   OBJECTIVE IMPAIRMENTS: Abnormal gait, decreased activity tolerance, decreased balance, decreased mobility, difficulty walking, decreased ROM, decreased strength, hypomobility, increased edema, increased fascial restrictions, impaired flexibility, impaired sensation, improper body mechanics, postural dysfunction, and pain.   ACTIVITY LIMITATIONS: carrying, lifting, bending, standing, squatting, sleeping, stairs, transfers, bed mobility, toileting, hygiene/grooming, and locomotion level  PARTICIPATION LIMITATIONS: meal prep, cleaning, laundry, shopping, community activity, and yard work  PERSONAL FACTORS: Age, Fitness, Past/current experiences, and Time since onset of injury/illness/exacerbation are also affecting patient's functional outcome.   REHAB POTENTIAL: Good  CLINICAL DECISION MAKING: Evolving/moderate complexity  EVALUATION COMPLEXITY: Moderate   GOALS: Goals reviewed with patient? Yes   SHORT TERM GOALS: Target date: 04/19/2024  Independent with initial HEP. Baseline: Will provide next session Goal status: INITIAL  2.  Pt will have improved L knee AROM from 5 to 110 deg Baseline: 10-108 deg Goal status: IN PROGRESS- 04/07/24 see ROM chart   LONG TERM GOALS: Target date: 05/10/2024  Independent with advanced/ongoing HEP to improve outcomes and carryover.  Baseline:  Goal status: INITIAL  2.  Lavonda Pour will demonstrate L knee flexion to 115 deg to ascend/descend stairs. Baseline: 110 PROM, 108 AROM on eval Goal status: INITIAL  3.  Lavonda Pour will demonstrate full L knee extension for safety with gait. Baseline: 5 deg from full ext PROM, 10 deg from full ext on eval AROM Goal status: INITIAL  4.  America Just  Strite will be able to ambulate 600' safely with LRAD and normal gait pattern to access community.  Baseline: Mostly in the house only without a/d Goal status: INITIAL  5.  Adrena Yarbro will be able to  ascend/descend stairs with 1 HR and reciprocal step pattern safely to access home and community.  Baseline: Requires step to pattern with hand rail  Goal status: INITIAL  6.  Lavonda Pour will demonstrate > 28/30 on FGA to demonstrate decreased risk of falls.   Baseline: 24/30 Goal status: INITIAL   8.  Lavonda Pour will demonstrate 48/80 on LEFS to demonstrate MCID.  Baseline: 39/80 Goal status: INITIAL    PLAN:  PT FREQUENCY: 2x/week  PT DURATION: 6 weeks  PLANNED INTERVENTIONS: 97164- PT Re-evaluation, 97110-Therapeutic exercises, 97530- Therapeutic activity, 97112- Neuromuscular re-education, 97535- Self Care, 46962- Manual therapy, [phone]- Gait training, [phone]- Aquatic Therapy, [phone]- Electrical stimulation (unattended), 97016- Vasopneumatic device, [phone]- Ionotophoresis 4mg /ml Dexamethasone , Patient/Family education, Balance training, Stair training, Taping, Dry Needling, Joint mobilization, Spinal mobilization, Cryotherapy, and Moist heat  PLAN FOR NEXT SESSION: Update pt's HEP accordingly. Work on endurance, ROM, balance, and progressive strengthening on L LE.    Mackenzie Lia Petra Brandy, PTA 04/07/2024, 4:28 PM

## 2024-04-12 ENCOUNTER — Ambulatory Visit: Attending: Orthopaedic Surgery | Admitting: Physical Therapy

## 2024-04-12 ENCOUNTER — Other Ambulatory Visit: Payer: Self-pay | Admitting: Family

## 2024-04-12 ENCOUNTER — Encounter: Payer: Self-pay | Admitting: Physical Therapy

## 2024-04-12 DIAGNOSIS — M6281 Muscle weakness (generalized): Secondary | ICD-10-CM | POA: Diagnosis not present

## 2024-04-12 DIAGNOSIS — M25662 Stiffness of left knee, not elsewhere classified: Secondary | ICD-10-CM | POA: Diagnosis not present

## 2024-04-12 DIAGNOSIS — M25562 Pain in left knee: Secondary | ICD-10-CM | POA: Insufficient documentation

## 2024-04-12 DIAGNOSIS — R600 Localized edema: Secondary | ICD-10-CM | POA: Diagnosis not present

## 2024-04-12 DIAGNOSIS — Z96652 Presence of left artificial knee joint: Secondary | ICD-10-CM | POA: Insufficient documentation

## 2024-04-12 DIAGNOSIS — R2689 Other abnormalities of gait and mobility: Secondary | ICD-10-CM | POA: Insufficient documentation

## 2024-04-12 DIAGNOSIS — M1712 Unilateral primary osteoarthritis, left knee: Secondary | ICD-10-CM | POA: Diagnosis not present

## 2024-04-12 DIAGNOSIS — G8929 Other chronic pain: Secondary | ICD-10-CM | POA: Diagnosis not present

## 2024-04-12 NOTE — Therapy (Signed)
 OUTPATIENT PHYSICAL THERAPY LOWER EXTREMITY TREATMENT   Patient Name: Beth [name] MRN: [id number] DOB:Jun 08, 1956, 68 y.o., female Today's Date: 04/12/2024  END OF SESSION:  PT End of Session - 04/12/24 1448     Visit Number 5    Number of Visits 12    Date for PT Re-Evaluation 05/10/24    Authorization Type [employer]    PT Start Time 1448    PT Stop Time 1543    PT Time Calculation (min) 55 min    Activity Tolerance Patient tolerated treatment well    Behavior During Therapy WFL for tasks assessed/performed                 Past Medical History:  Diagnosis Date   Anxiety    Arthritis    Depression    Sleep apnea    does not uses CPAP; uses mouth guard.    Past Surgical History:  Procedure Laterality Date   ANKLE SURGERY Right 2008   BACK SURGERY  2009   BUNIONECTOMY Right    COLONOSCOPY     COSMETIC SURGERY     JOINT REPLACEMENT  '13, '21   r knee, r shoulder   NISSEN FUNDOPLICATION     REPLACEMENT TOTAL KNEE Right 2012   SPINE SURGERY  '09   back   TONSILLECTOMY     TOTAL KNEE ARTHROPLASTY Left 03/09/2024   Procedure: ARTHROPLASTY, KNEE, TOTAL;  Surgeon: Arnie Lao, MD;  Location: [facility] OR;  Service: Orthopedics;  Laterality: Left;   TOTAL SHOULDER ARTHROPLASTY Right 03/28/2020   Procedure: RIGHT SHOULDER REPLACEMENT;  Surgeon: Jasmine Mesi, MD;  Location: [facility] OR;  Service: Orthopedics;  Laterality: Right;   Patient Active Problem List   Diagnosis Date Noted   Status post total left knee replacement 03/09/2024   Morbid obesity (HCC) 07/22/2023   Preventative health care 07/22/2023   Onychomycosis 01/14/2023   Eczema 01/14/2023   Hypertriglyceridemia 07/17/2022   Hyperglycemia 07/17/2022   Bilateral primary osteoarthritis of knee 01/17/2022   Chronic pain of left knee 01/15/2022   Bilateral carpal tunnel syndrome 01/15/2022   Microscopic hematuria 03/04/2021   OSA (obstructive sleep apnea) 07/27/2020   Insomnia 04/25/2020    Restless leg 04/25/2020   Depression 04/25/2020   Arthritis of shoulder 03/28/2020   Primary osteoarthritis, right shoulder 01/11/2020    PCP: Dorrene Gaucher, NP  REFERRING PROVIDER: Arnie Lao, MD  REFERRING DIAG: [phone] (ICD-10-CM) - Unilateral primary osteoarthritis, left knee [phone] (ICD-10-CM) - Status post total left knee replacement  THERAPY DIAG:  Chronic pain of left knee  Stiffness of left knee, not elsewhere classified  Muscle weakness (generalized)  Localized edema  Other abnormalities of gait and mobility  Unilateral primary osteoarthritis, left knee  Status post total left knee replacement  Rationale for Evaluation and Treatment: Rehabilitation  ONSET DATE: S/P Left TKA on 03/09/24 with Dr. Lorane Rocker MD VISIT: 04/19/24   SUBJECTIVE:   SUBJECTIVE STATEMENT: Pt denies pain, only noting mild discomfort today.   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 Yes: NPRS scale: 0/10   Pain location: Front of L knee Pain description: discomfort, but not really pain  Aggravating factors: "If I've over done it" Relieving factors: Icing, tylenol   PRECAUTIONS: None  RED FLAGS: None   WEIGHT BEARING RESTRICTIONS: No  FALLS:  Has patient fallen in last 6 months? No  LIVING ENVIRONMENT: Lives with: lives with  their spouse Chartered loss adjuster) Lives in: House/apartment Stairs: A few stairs to enter and holds on to pole Has following equipment at home: None  OCCUPATION: Retired; mostly house work  PLOF: Independent  PATIENT GOALS: Improve walkin   OBJECTIVE:  Note: Objective measures were completed at Evaluation unless otherwise noted.  DIAGNOSTIC FINDINGS: n/a  PATIENT SURVEYS:  LEFS: 39/80 = 48.75%  COGNITION: Overall cognitive status: Within functional limits for tasks assessed     SENSATION: Reports N/T in anterior knee  EDEMA:  Mild to moderate edema around L  knee Sutures appear well, still has steri strips with some beginnings of scabbing  MUSCLE LENGTH: Hamstrings: See knee ROM below Andy Bannister test: Did not assess  POSTURE: No Significant postural limitations  PALPATION: No overt tenderness to palpation  LOWER EXTREMITY ROM:  Active ROM Right eval Left eval L 04/07/24  Hip flexion     Hip extension     Hip abduction     Hip adduction     Hip internal rotation     Hip external rotation     Knee flexion A: 115 P: 115 A: 108 P: 110 A: 112 in hanging position  Knee extension A: 0 P: 0 A: -10  P: -3 A: 6 LAQ  Ankle dorsiflexion     Ankle plantarflexion     Ankle inversion     Ankle eversion      (Blank rows = not tested)  LOWER EXTREMITY MMT:  MMT Right eval Left eval  Hip flexion 5 5  Hip extension 4 3+  Hip abduction 5 4  Hip adduction    Hip internal rotation    Hip external rotation    Knee flexion 5 4+ within available range  Knee extension 5 4+  Ankle dorsiflexion    Ankle plantarflexion    Ankle inversion    Ankle eversion     (Blank rows = not tested)  LOWER EXTREMITY SPECIAL TESTS:  Did not assess  FUNCTIONAL TESTS:  5 times sit to stand: 8.5 sec Berg Balance Scale: 52/56 Functional gait assessment: 24/30  GAIT: Distance walked: Into clinic Assistive device utilized: None Level of assistance: Complete Independence Comments: Mildly antalgic especially with first few steps, decreased knee flexion during swing. Improves after a few steps into normal reciprocal gait pattern                                                                                                                                TREATMENT DATE:   04/12/24 THERAPEUTIC EXERCISE: To improve strength, endurance, and ROM.  Demonstration, verbal and tactile cues throughout for technique.  Rec Bike - L2 x 6 min BATCA leg press 25# x 10; 35# 2 x 10 BATCA knee flexion/HS curl 35# 2 x 10; B con/L ecc 25# x 10 BATCA knee extension 25# x  10; B con/L ecc 20# x 7  NEUROMUSCULAR RE-EDUCATION: To improve balance, coordination, kinesthesia, and proprioception. R/L SLS +  opp LE 5-way toe tap to colored dots x 5 cycles R/L SLS on blue foam oval + opp LE 5-way toe tap to colored dots x 5 cycles Alt toe clears from Airex pad to 9" stool x 20 R/L eccentric lowering with toe touch x 10, heel touch x 10 Standing Fitter hip ABD (1 black, 1 blue) x 10 bil, UE support on back of chair for balance Standing Fitter hip extension (1 black, 1 blue) x 10 bil, UE support on back of chair for balance  MODALITIES: Game Ready vasopneumatic compression post session to L knee x 15 min, medium compression, 34 to reduce post-exercise pain and swelling/edema    04/07/24 Recumbent bike partial revolutions to full revolutions x 6 min Modified pigeon stretch x 30" B Squats x 10 - used mirror for visual feedback Single leg clock reach x5 on airex Hip extension on airex x 10 B intermittent UE support Hamstring curl 35# 2x10 Knee ext 25# 2x10 Supine AAROM heel slide using peanut ball x10 Vaso x15 min, L knee low compression, 34 deg   04/05/24 Recumbent bike partial revolutions to full revolutions x 6 min Tall kneel + hip ext x10 Half kneel into knee flexion on L x10 4 way hip RTB looped attached to furniture 2x 10 B Heel/toe raise 2x10 Single leg clock reach x10, on airex x10 Hamstring curl 35# 2x10 Knee ext 25# 2x10 Fwd and bwd amb x2 laps around gym Vaso x15 min, L knee low compression, 34 deg   PATIENT EDUCATION:  Education details: ongoing PT POC and continue with current HEP  Person educated: Patient Education method: Explanation, Demonstration, and Handouts Education comprehension: verbalized understanding, returned demonstration, and needs further education  HOME EXERCISE PROGRAM: Access Code: [email or web address] URL: [email or web address] Date: 03/31/2024 Prepared by: Braylin Clark  Exercises - Squat with Counter Support  -  1 x daily - 3 x weekly - 2 sets - 10 reps - 3 second hold - Standing Hip Extension with Resistance  - 1 x daily - 3 x weekly - 2 sets - 10 reps - Standing Hip Adduction with Resistance  - 1 x daily - 3 x weekly - 2 sets - 10 reps - Standing Hip Flexion with Resistance  - 1 x daily - 3 x weekly - 2 sets - 10 reps - Standing Hip Abduction with Resistance  - 1 x daily - 3 x weekly - 2 sets - 10 reps - Sitting Knee Extension with Resistance  - 1 x daily - 3 x weekly - 2 sets - 10 reps   ASSESSMENT:  CLINICAL IMPRESSION: Shronda reports no pain today, only mild discomfort.  She reports issues with her HEP and states the RTB resistance still feels challenging, denying need for review or progression - STG #1 met.  Continue to progress strengthening with eccentric focus as well as dynamic focus to promote improved balance.  All exercises well tolerated other than she notes fatigue more quickly on L.  Skyi will benefit from continued skilled PT to address her remaining ROM, strength and balnace deficits to improve mobility and activity tolerance with decreased pain interference.   OBJECTIVE IMPAIRMENTS: Abnormal gait, decreased activity tolerance, decreased balance, decreased mobility, difficulty walking, decreased ROM, decreased strength, hypomobility, increased edema, increased fascial restrictions, impaired flexibility, impaired sensation, improper body mechanics, postural dysfunction, and pain.   ACTIVITY LIMITATIONS: carrying, lifting, bending, standing, squatting, sleeping, stairs, transfers, bed mobility, toileting, hygiene/grooming, and locomotion level  PARTICIPATION LIMITATIONS: meal prep, cleaning, laundry, shopping,  community activity, and yard work  PERSONAL FACTORS: Age, Fitness, Past/current experiences, and Time since onset of injury/illness/exacerbation are also affecting patient's functional outcome.   REHAB POTENTIAL: Good  CLINICAL DECISION MAKING: Evolving/moderate  complexity  EVALUATION COMPLEXITY: Moderate   GOALS: Goals reviewed with patient? Yes   SHORT TERM GOALS: Target date: 04/19/2024  Independent with initial HEP. Baseline: Will provide next session Goal status: MET - 04/12/24  2.  Pt will have improved L knee AROM from 5 to 110 deg Baseline: 10-108 deg Goal status: IN PROGRESS - 04/07/24 - see ROM chart   LONG TERM GOALS: Target date: 05/10/2024  Independent with advanced/ongoing HEP to improve outcomes and carryover.  Baseline:  Goal status: INITIAL  2.  Lavonda Pour will demonstrate L knee flexion to 115 deg to ascend/descend stairs. Baseline: 110 PROM, 108 AROM on eval Goal status: INITIAL  3.  Lavonda Pour will demonstrate full L knee extension for safety with gait. Baseline: 5 deg from full ext PROM, 10 deg from full ext on eval AROM Goal status: INITIAL  4.  Tyquisha Moyle will be able to ambulate 600' safely with LRAD and normal gait pattern to access community.  Baseline: Mostly in the house only without a/d Goal status: INITIAL  5.  Onelia Zeien will be able to ascend/descend stairs with 1 HR and reciprocal step pattern safely to access home and community.  Baseline: Requires step to pattern with hand rail  Goal status: INITIAL  6.  Lavonda Pour will demonstrate > 28/30 on FGA to demonstrate decreased risk of falls.   Baseline: 24/30 Goal status: INITIAL  7.  Lavonda Pour will demonstrate 48/80 on LEFS to demonstrate MCID.  Baseline: 39/80 Goal status: INITIAL    PLAN:  PT FREQUENCY: 2x/week  PT DURATION: 6 weeks  PLANNED INTERVENTIONS: 97164- PT Re-evaluation, 97110-Therapeutic exercises, 97530- Therapeutic activity, [id number]- Neuromuscular re-education, 97535- Self Care, 47829- Manual therapy, [phone]- Gait training, [phone]- Aquatic Therapy, [phone]- Electrical stimulation (unattended), 97016- Vasopneumatic device, [id number]- Ionotophoresis 4mg /ml Dexamethasone , Patient/Family  education, Balance training, Stair training, Taping, Dry Needling, Joint mobilization, Spinal mobilization, Cryotherapy, and Moist heat  PLAN FOR NEXT SESSION: MD PN for 04/19/24 appt. Update pt's HEP accordingly. Work on endurance, ROM, balance, and progressive strengthening on L LE.    Francisco Irving, PT 04/12/2024, 3:32 PM

## 2024-04-14 ENCOUNTER — Ambulatory Visit: Admitting: Physical Therapy

## 2024-04-14 DIAGNOSIS — G8929 Other chronic pain: Secondary | ICD-10-CM

## 2024-04-14 DIAGNOSIS — M1712 Unilateral primary osteoarthritis, left knee: Secondary | ICD-10-CM

## 2024-04-14 DIAGNOSIS — M25562 Pain in left knee: Secondary | ICD-10-CM | POA: Diagnosis not present

## 2024-04-14 DIAGNOSIS — M6281 Muscle weakness (generalized): Secondary | ICD-10-CM | POA: Diagnosis not present

## 2024-04-14 DIAGNOSIS — M25662 Stiffness of left knee, not elsewhere classified: Secondary | ICD-10-CM | POA: Diagnosis not present

## 2024-04-14 DIAGNOSIS — R2689 Other abnormalities of gait and mobility: Secondary | ICD-10-CM | POA: Diagnosis not present

## 2024-04-14 DIAGNOSIS — Z96652 Presence of left artificial knee joint: Secondary | ICD-10-CM | POA: Diagnosis not present

## 2024-04-14 DIAGNOSIS — R600 Localized edema: Secondary | ICD-10-CM

## 2024-04-14 NOTE — Therapy (Signed)
 OUTPATIENT PHYSICAL THERAPY LOWER EXTREMITY TREATMENT   Patient Name: Beth [name] MRN: [id number] DOB:Nov 10, 1956, 68 y.o., female Today's Date: 04/14/2024  END OF SESSION:  PT End of Session - 04/14/24 1443     Visit Number 6    Number of Visits 12    Date for PT Re-Evaluation 05/10/24    Authorization Type [employer]    PT Start Time 1445    PT Stop Time 1540    PT Time Calculation (min) 55 min    Activity Tolerance Patient tolerated treatment well    Behavior During Therapy WFL for tasks assessed/performed             Past Medical History:  Diagnosis Date   Anxiety    Arthritis    Depression    Sleep apnea    does not uses CPAP; uses mouth guard.    Past Surgical History:  Procedure Laterality Date   ANKLE SURGERY Right 2008   BACK SURGERY  2009   BUNIONECTOMY Right    COLONOSCOPY     COSMETIC SURGERY     JOINT REPLACEMENT  '13, '21   r knee, r shoulder   NISSEN FUNDOPLICATION     REPLACEMENT TOTAL KNEE Right 2012   SPINE SURGERY  '09   back   TONSILLECTOMY     TOTAL KNEE ARTHROPLASTY Left 03/09/2024   Procedure: ARTHROPLASTY, KNEE, TOTAL;  Surgeon: Beth Lao, [name];  Location: [facility] OR;  [name]: Orthopedics;  Laterality: Left;   TOTAL SHOULDER ARTHROPLASTY Right 03/28/2020   Procedure: RIGHT SHOULDER REPLACEMENT;  Surgeon: Beth Mesi, [name];  Location: [facility] OR;  [name]: Orthopedics;  Laterality: Right;   Patient Active Problem List   Diagnosis Date Noted   Status post total left knee replacement 03/09/2024   Morbid obesity (HCC) 07/22/2023   Preventative health care 07/22/2023   Onychomycosis 01/14/2023   Eczema 01/14/2023   Hypertriglyceridemia 07/17/2022   Hyperglycemia 07/17/2022   Bilateral primary osteoarthritis of knee 01/17/2022   Chronic pain of left knee 01/15/2022   Bilateral carpal tunnel syndrome 01/15/2022   Microscopic hematuria 03/04/2021   OSA (obstructive sleep apnea) 07/27/2020   Insomnia 04/25/2020    Restless leg 04/25/2020   Depression 04/25/2020   Arthritis of shoulder 03/28/2020   Primary osteoarthritis, right shoulder 01/11/2020    PCP: Beth Gaucher, NP  REFERRING PROVIDER: Arnie Lao, [name]  REFERRING DIAG: [phone] (ICD-10-CM) - Unilateral primary osteoarthritis, left knee [phone] (ICD-10-CM) - Status post total left knee replacement  THERAPY DIAG:  Chronic pain of left knee  Stiffness of left knee, not elsewhere classified  Muscle weakness (generalized)  Localized edema  Other abnormalities of gait and mobility  Unilateral primary osteoarthritis, left knee  Rationale for Evaluation and Treatment: Rehabilitation  ONSET DATE: S/P Left TKA on 03/09/24 with Dr. Lorane Rocker [name] VISIT: 04/19/24   SUBJECTIVE:   SUBJECTIVE STATEMENT: No new complaints. Was able to go shopping yesterday without any issues.   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 Yes: NPRS scale: 0/10   Pain location: Front of Beth knee Pain description: discomfort, but not really pain  Aggravating factors: "If I've over done it" Relieving factors: Icing, tylenol   PRECAUTIONS: None  RED FLAGS: None   WEIGHT BEARING RESTRICTIONS: No  FALLS:  Has patient fallen in last 6 months? No  LIVING ENVIRONMENT: Lives with: lives with their spouse (trucker) Lives in: House/apartment Stairs:  A few stairs to enter and holds on to pole Has following equipment at home: None  OCCUPATION: Retired; mostly house work  PLOF: Independent  PATIENT GOALS: Improve walkin   OBJECTIVE:  Note: Objective measures were completed at Evaluation unless otherwise noted.  DIAGNOSTIC FINDINGS: n/a  PATIENT SURVEYS:  LEFS: 39/80 = 48.75%  COGNITION: Overall cognitive status: Within functional limits for tasks assessed     SENSATION: Reports N/T in anterior knee  EDEMA:  Mild to moderate edema around Beth knee Sutures appear  well, still has steri strips with some beginnings of scabbing  MUSCLE LENGTH: Hamstrings: See knee ROM below Beth [name] test: Did not assess  POSTURE: No Significant postural limitations  PALPATION: No overt tenderness to palpation  LOWER EXTREMITY ROM:  Active ROM Right eval Left eval Beth 04/07/24 Beth 04/14/24  Hip flexion      Hip extension      Hip abduction      Hip adduction      Hip internal rotation      Hip external rotation      Knee flexion A: 115 P: 115 A: 108 P: 110 A: 112 in hanging position A: 115 P: 120  Knee extension A: 0 P: 0 A: -10  P: -3 A: 6 LAQ A: -2 P: 0  Ankle dorsiflexion      Ankle plantarflexion      Ankle inversion      Ankle eversion       (Blank rows = not tested)  LOWER EXTREMITY MMT:  MMT Right eval Left eval  Hip flexion 5 5  Hip extension 4 3+  Hip abduction 5 4  Hip adduction    Hip internal rotation    Hip external rotation    Knee flexion 5 4+ within available range  Knee extension 5 4+  Ankle dorsiflexion    Ankle plantarflexion    Ankle inversion    Ankle eversion     (Blank rows = not tested)  LOWER EXTREMITY SPECIAL TESTS:  Did not assess  FUNCTIONAL TESTS:  5 times sit to stand: 8.5 sec Berg Balance Scale: 52/56 Functional gait assessment: 24/30  GAIT: Distance walked: Into clinic Assistive device utilized: None Level of assistance: Complete Independence Comments: Mildly antalgic especially with first few steps, decreased knee flexion during swing. Improves after a few steps into normal reciprocal gait pattern                                                                                                                                TREATMENT DATE:  04/14/24 Nustep L7 x 6 min UEs/LEs BATCA leg press 45# double legs 2x 10; 20# single leg 2x 10 BATCA knee flexion/HS curl eccentrics on Beth 35# 2 x 10 BATCA knee extension 25#  eccentrics on Beth 2x10 Rechecked ROM 3 way lunge foot on slider 2x10 with intermittent UE  support Game Ready vasopneumatic compression post session to Beth knee  x 15 min, medium compression, 34 to reduce post-exercise pain and swelling/edema    04/12/24 THERAPEUTIC EXERCISE: To improve strength, endurance, and ROM.  Demonstration, verbal and tactile cues throughout for technique.  Rec Bike - L2 x 6 min BATCA leg press 25# x 10; 35# 2 x 10 BATCA knee flexion/HS curl 35# 2 x 10; B con/Beth ecc 25# x 10 BATCA knee extension 25# x 10; B con/Beth ecc 20# x 7  NEUROMUSCULAR RE-EDUCATION: To improve balance, coordination, kinesthesia, and proprioception. R/Beth SLS + opp LE 5-way toe tap to colored dots x 5 cycles R/Beth SLS on blue foam oval + opp LE 5-way toe tap to colored dots x 5 cycles Alt toe clears from Airex pad to 9" stool x 20 R/Beth eccentric lowering with toe touch x 10, heel touch x 10 Standing Fitter hip ABD (1 black, 1 blue) x 10 bil, UE support on back of chair for balance Standing Fitter hip extension (1 black, 1 blue) x 10 bil, UE support on back of chair for balance  MODALITIES: Game Ready vasopneumatic compression post session to Beth knee x 15 min, medium compression, 34 to reduce post-exercise pain and swelling/edema    04/07/24 Recumbent bike partial revolutions to full revolutions x 6 min Modified pigeon stretch x 30" B Squats x 10 - used mirror for visual feedback Single leg clock reach x5 on airex Hip extension on airex x 10 B intermittent UE support Hamstring curl 35# 2x10 Knee ext 25# 2x10 Supine AAROM heel slide using peanut ball x10 Vaso x15 min, Beth knee low compression, 34 deg   04/05/24 Recumbent bike partial revolutions to full revolutions x 6 min Tall kneel + hip ext x10 Half kneel into knee flexion on Beth x10 4 way hip RTB looped attached to furniture 2x 10 B Heel/toe raise 2x10 Single leg clock reach x10, on airex x10 Hamstring curl 35# 2x10 Knee ext 25# 2x10 Fwd and bwd amb x2 laps around gym Vaso x15 min, Beth knee low compression, 34 deg   PATIENT  EDUCATION:  Education details: ongoing PT POC and continue with current HEP  Person educated: Patient Education method: Explanation, Demonstration, and Handouts Education comprehension: verbalized understanding, returned demonstration, and needs further education  HOME EXERCISE PROGRAM: Access Code: [email or web address] URL: [email or web address] Date: 03/31/2024 Prepared by: Beth [name]  Exercises - Squat with Counter Support  - 1 x daily - 3 x weekly - 2 sets - 10 reps - 3 second hold - Standing Hip Extension with Resistance  - 1 x daily - 3 x weekly - 2 sets - 10 reps - Standing Hip Adduction with Resistance  - 1 x daily - 3 x weekly - 2 sets - 10 reps - Standing Hip Flexion with Resistance  - 1 x daily - 3 x weekly - 2 sets - 10 reps - Standing Hip Abduction with Resistance  - 1 x daily - 3 x weekly - 2 sets - 10 reps - Sitting Knee Extension with Resistance  - 1 x daily - 3 x weekly - 2 sets - 10 reps   ASSESSMENT:  CLINICAL IMPRESSION: Beth [name] has met her ROM goals. Continued gross Beth LE strengthening and stabilization with her body weight. Able to progress to more machine work to transition her to more community wellness exercises. Still has some limited endurance with Beth LE compared to R and some weakness notable when she is shifting full weight on Beth LE. She continues to progress well towards her goals.  She will be following up with ortho on Monday -- will be considering d/c next session if her follow up goes well.   OBJECTIVE IMPAIRMENTS: Abnormal gait, decreased activity tolerance, decreased balance, decreased mobility, difficulty walking, decreased ROM, decreased strength, hypomobility, increased edema, increased fascial restrictions, impaired flexibility, impaired sensation, improper body mechanics, postural dysfunction, and pain.   ACTIVITY LIMITATIONS: carrying, lifting, bending, standing, squatting, sleeping, stairs, transfers, bed mobility, toileting, hygiene/grooming,  and locomotion level  PARTICIPATION LIMITATIONS: meal prep, cleaning, laundry, shopping, community activity, and yard work  PERSONAL FACTORS: Age, Fitness, Past/current experiences, and Time since onset of injury/illness/exacerbation are also affecting patient's functional outcome.   REHAB POTENTIAL: Good  CLINICAL DECISION MAKING: Evolving/moderate complexity  EVALUATION COMPLEXITY: Moderate   GOALS: Goals reviewed with patient? Yes   SHORT TERM GOALS: Target date: 04/19/2024  Independent with initial HEP. Baseline: Will provide next session Goal status: MET - 04/12/24  2.  Pt will have improved Beth knee AROM from 5 to 110 deg Baseline: 10-108 deg Goal status: MET - 04/14/24 - see ROM chart   LONG TERM GOALS: Target date: 05/10/2024  Independent with advanced/ongoing HEP to improve outcomes and carryover.  Baseline:  Goal status: IN PROGRESS  2.  Beth [name] will demonstrate Beth knee flexion to 115 deg to ascend/descend stairs. Baseline: 110 PROM, 108 AROM on eval Goal status: MET 04/14/24  3.  Beth [name] will demonstrate full Beth knee extension for safety with gait. Baseline: 5 deg from full ext PROM, 10 deg from full ext on eval AROM Goal status: MET 04/14/24  4.  Beth [name] will be able to ambulate 600' safely with LRAD and normal gait pattern to access community.  Baseline: Mostly in the house only without a/d Goal status: MET 04/14/24 -- shopped around [employer] without any a/d  5.  Beth [name] will be able to ascend/descend stairs with 1 HR and reciprocal step pattern safely to access home and community.  Baseline: Requires step to pattern with hand rail  Goal status: INITIAL  6.  Beth [name] will demonstrate > 28/30 on FGA to demonstrate decreased risk of falls.   Baseline: 24/30 Goal status: INITIAL  7.  Beth [name] will demonstrate 48/80 on LEFS to demonstrate MCID.  Baseline: 39/80 Goal status: INITIAL    PLAN:  PT  FREQUENCY: 2x/week  PT DURATION: 6 weeks  PLANNED INTERVENTIONS: 97164- PT Re-evaluation, 97110-Therapeutic exercises, 97530- Therapeutic activity, 97112- Neuromuscular re-education, 97535- Self Care, 54627- Manual therapy, [phone]- Gait training, [phone]- Aquatic Therapy, [phone]- Electrical stimulation (unattended), 97016- Vasopneumatic device, [id number]- Ionotophoresis 4mg /ml Dexamethasone , Patient/Family education, Balance training, Stair training, Taping, Dry Needling, Joint mobilization, Spinal mobilization, Cryotherapy, and Moist heat  PLAN FOR NEXT SESSION: [name] PN for 04/19/24 appt. Update pt's HEP accordingly. Work on endurance, ROM, balance, and progressive strengthening on Beth LE.    Beth [name] Beth [name] Beth [name], PT 04/14/2024, 2:50 PM

## 2024-04-19 ENCOUNTER — Ambulatory Visit: Admitting: Physical Therapy

## 2024-04-19 ENCOUNTER — Encounter: Payer: Self-pay | Admitting: Orthopaedic Surgery

## 2024-04-19 ENCOUNTER — Ambulatory Visit (INDEPENDENT_AMBULATORY_CARE_PROVIDER_SITE_OTHER): Admitting: Orthopaedic Surgery

## 2024-04-19 DIAGNOSIS — M25662 Stiffness of left knee, not elsewhere classified: Secondary | ICD-10-CM

## 2024-04-19 DIAGNOSIS — M6281 Muscle weakness (generalized): Secondary | ICD-10-CM | POA: Diagnosis not present

## 2024-04-19 DIAGNOSIS — M1712 Unilateral primary osteoarthritis, left knee: Secondary | ICD-10-CM | POA: Diagnosis not present

## 2024-04-19 DIAGNOSIS — R2689 Other abnormalities of gait and mobility: Secondary | ICD-10-CM

## 2024-04-19 DIAGNOSIS — M25562 Pain in left knee: Secondary | ICD-10-CM | POA: Diagnosis not present

## 2024-04-19 DIAGNOSIS — G8929 Other chronic pain: Secondary | ICD-10-CM

## 2024-04-19 DIAGNOSIS — R600 Localized edema: Secondary | ICD-10-CM | POA: Diagnosis not present

## 2024-04-19 DIAGNOSIS — Z96652 Presence of left artificial knee joint: Secondary | ICD-10-CM

## 2024-04-19 NOTE — Therapy (Addendum)
 OUTPATIENT PHYSICAL THERAPY LOWER EXTREMITY TREATMENT AND DISCHARGE  PHYSICAL THERAPY DISCHARGE SUMMARY  Visits from Start of Care: 7  Current functional level related to goals / functional outcomes: See below. Pt has met all goals   Remaining deficits: Decreased eccentric quad strength and some reduced balance that may be from baseline   Education / Equipment: Final HEP and things to continue to work on at home   Patient agrees to discharge. Patient goals were met. Patient is being discharged due to meeting the stated rehab goals.    Patient Name: Naoki Migliaccio MRN: [id number] DOB:08/19/1956, 68 y.o., female Today's Date: 04/19/2024  END OF SESSION:  PT End of Session - 04/19/24 1447     Visit Number 7    Number of Visits 12    Date for PT Re-Evaluation 05/10/24    Authorization Type [employer]    PT Start Time 1447    PT Stop Time 1540    PT Time Calculation (min) 53 min    Activity Tolerance Patient tolerated treatment well    Behavior During Therapy WFL for tasks assessed/performed              Past Medical History:  Diagnosis Date   Anxiety    Arthritis    Depression    Sleep apnea    does not uses CPAP; uses mouth guard.    Past Surgical History:  Procedure Laterality Date   ANKLE SURGERY Right 2008   BACK SURGERY  2009   BUNIONECTOMY Right    COLONOSCOPY     COSMETIC SURGERY     JOINT REPLACEMENT  '13, '21   r knee, r shoulder   NISSEN FUNDOPLICATION     REPLACEMENT TOTAL KNEE Right 2012   SPINE SURGERY  '09   back   TONSILLECTOMY     TOTAL KNEE ARTHROPLASTY Left 03/09/2024   Procedure: ARTHROPLASTY, KNEE, TOTAL;  Surgeon: Vernetta Lonni GRADE, MD;  Location: [facility] OR;  Service: Orthopedics;  Laterality: Left;   TOTAL SHOULDER ARTHROPLASTY Right 03/28/2020   Procedure: RIGHT SHOULDER REPLACEMENT;  Surgeon: Addie Cordella Hamilton, MD;  Location: [facility] OR;  Service: Orthopedics;  Laterality: Right;   Patient Active Problem List   Diagnosis  Date Noted   Status post total left knee replacement 03/09/2024   Morbid obesity (HCC) 07/22/2023   Preventative health care 07/22/2023   Onychomycosis 01/14/2023   Eczema 01/14/2023   Hypertriglyceridemia 07/17/2022   Hyperglycemia 07/17/2022   Bilateral primary osteoarthritis of knee 01/17/2022   Chronic pain of left knee 01/15/2022   Bilateral carpal tunnel syndrome 01/15/2022   Microscopic hematuria 03/04/2021   OSA (obstructive sleep apnea) 07/27/2020   Insomnia 04/25/2020   Restless leg 04/25/2020   Depression 04/25/2020   Arthritis of shoulder 03/28/2020   Primary osteoarthritis, right shoulder 01/11/2020    PCP: Daryl Setter, NP  REFERRING PROVIDER: Vernetta Lonni GRADE, MD  REFERRING DIAG: [phone] (ICD-10-CM) - Unilateral primary osteoarthritis, left knee [phone] (ICD-10-CM) - Status post total left knee replacement  THERAPY DIAG:  Chronic pain of left knee  Stiffness of left knee, not elsewhere classified  Muscle weakness (generalized)  Localized edema  Other abnormalities of gait and mobility  Rationale for Evaluation and Treatment: Rehabilitation  ONSET DATE: S/P Left TKA on 03/09/24 with Dr. Vernetta QUITTER MD VISIT: 04/19/24   SUBJECTIVE:   SUBJECTIVE STATEMENT: Pt states ortho was very pleased with her progress. Pt feels ready for d/c. [facility]   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 Yes: NPRS scale: 0/10   Pain location: Front of L knee Pain description: discomfort, but not really pain  Aggravating factors: If I've over done it Relieving factors: Icing, tylenol   PRECAUTIONS: None  RED FLAGS: None   WEIGHT BEARING RESTRICTIONS: No  FALLS:  Has patient fallen in last 6 months? 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  DIAGNOSTIC FINDINGS: n/a  PATIENT SURVEYS:  LEFS: 39/80 = 48.75%  COGNITION: Overall cognitive status: Within functional limits for tasks assessed     SENSATION: Reports N/T in anterior knee  EDEMA:  Mild to moderate edema around L knee Sutures appear well, still has steri strips with some beginnings of scabbing  MUSCLE LENGTH: Hamstrings: See knee ROM below Debby test: Did not assess  POSTURE: No Significant postural limitations  PALPATION: No overt tenderness to palpation  LOWER EXTREMITY ROM:  Active ROM Right eval Left eval L 04/07/24 L 04/14/24 L 04/19/24  Hip flexion       Hip extension       Hip abduction       Hip adduction       Hip internal rotation       Hip external rotation       Knee flexion A: 115 P: 115 A: 108 P: 110 A: 112 in hanging position A: 115 P: 120 A: 117  Knee extension A: 0 P: 0 A: -10  P: -3 A: 6 LAQ A: -2 P: 0 A: 0  Ankle dorsiflexion       Ankle plantarflexion       Ankle inversion       Ankle eversion        (Blank rows = not tested)  LOWER EXTREMITY MMT:  MMT Right eval Left eval  Hip flexion 5 5  Hip extension 4 3+  Hip abduction 5 4  Hip adduction    Hip internal rotation    Hip external rotation    Knee flexion 5 4+ within available range  Knee extension 5 4+  Ankle dorsiflexion    Ankle plantarflexion    Ankle inversion    Ankle eversion     (Blank rows = not tested)  LOWER EXTREMITY SPECIAL TESTS:  Did not assess  FUNCTIONAL TESTS:  5 times sit to stand: 8.5 sec Berg Balance Scale: 52/56 Functional gait assessment: 24/30  GAIT: Distance walked: Into clinic Assistive device utilized: None Level of assistance: Complete Independence Comments: Mildly antalgic especially  with first few steps, decreased knee flexion during swing. Improves after a few steps into normal reciprocal gait pattern  TREATMENT DATE:  04/19/24 Rec Bike - L2 x 5 min Quadruped rocking into knee flexion x10 Quadruped hamstring curl 2x10 Self care: Using gym equipment to increase knee flexion ROM BATCA leg press 45# double legs 2x 10; 25# single leg 2x 10 BATCA knee flexion/HS curl eccentrics on L 45# 2 x 10 BATCA knee extension 35#  eccentrics on L 2x10 BATCA hip ext and abd 5# 2x10 Rechecked LTGs  [facility] PT Assessment - 04/19/24 0001       Functional Gait  Assessment   Gait Level Surface Walks 20 ft in less than 5.5 sec, no assistive devices, good speed, no evidence for imbalance, normal gait pattern, deviates no more than 6 in outside of the 12 in walkway width.    Change in Gait Speed Able to smoothly change walking speed without loss of balance or gait deviation. Deviate no more than 6 in outside of the 12 in walkway width.    Gait with Horizontal Head Turns Performs head turns smoothly with no change in gait. Deviates no more than 6 in outside 12 in walkway width    Gait with Vertical Head Turns Performs head turns with no change in gait. Deviates no more than 6 in outside 12 in walkway width.    Gait and Pivot Turn Pivot turns safely within 3 sec and stops quickly with no loss of balance.    Step Over Obstacle Is able to step over 2 stacked shoe boxes taped together (9 in total height) without changing gait speed. No evidence of imbalance.    Gait with Narrow Base of Support Ambulates 7-9 steps.    Gait with Eyes Closed Walks 20 ft, no assistive devices, good speed, no evidence of imbalance, normal gait pattern, deviates no more than 6 in outside 12 in walkway width. Ambulates 20 ft in less than 7 sec.    Ambulating Backwards Walks 20 ft, no assistive  devices, good speed, no evidence for imbalance, normal gait    Steps Alternating feet, must use rail.    Total Score 28               04/14/24 Nustep L7 x 6 min UEs/LEs BATCA leg press 45# double legs 2x 10; 20# single leg 2x 10 BATCA knee flexion/HS curl eccentrics on L 35# 2 x 10 BATCA knee extension 25#  eccentrics on L 2x10 Rechecked ROM 3 way lunge foot on slider 2x10 with intermittent UE support Game Ready vasopneumatic compression post session to L knee x 15 min, medium compression, 34 to reduce post-exercise pain and swelling/edema    04/12/24 THERAPEUTIC EXERCISE: To improve strength, endurance, and ROM.  Demonstration, verbal and tactile cues throughout for technique.  Rec Bike - L2 x 6 min BATCA leg press 25# x 10; 35# 2 x 10 BATCA knee flexion/HS curl 35# 2 x 10; B con/L ecc 25# x 10 BATCA knee extension 25# x 10; B con/L ecc 20# x 7  NEUROMUSCULAR RE-EDUCATION: To improve balance, coordination, kinesthesia, and proprioception. R/L SLS + opp LE 5-way toe tap to colored dots x 5 cycles R/L SLS on blue foam oval + opp LE 5-way toe tap to colored dots x 5 cycles Alt toe clears from Airex pad to 9 stool x 20 R/L eccentric lowering with toe touch x 10, heel touch x 10 Standing Fitter hip ABD (1 black, 1 blue) x 10 bil, UE support on back of chair for balance Standing Fitter hip extension (1 black, 1 blue) x  10 bil, UE support on back of chair for balance  MODALITIES: Game Ready vasopneumatic compression post session to L knee x 15 min, medium compression, 34 to reduce post-exercise pain and swelling/edema    04/07/24 Recumbent bike partial revolutions to full revolutions x 6 min Modified pigeon stretch x 30 B Squats x 10 - used mirror for visual feedback Single leg clock reach x5 on airex Hip extension on airex x 10 B intermittent UE support Hamstring curl 35# 2x10 Knee ext 25# 2x10 Supine AAROM heel slide using peanut ball x10 Vaso x15 min, L knee low  compression, 34 deg   04/05/24 Recumbent bike partial revolutions to full revolutions x 6 min Tall kneel + hip ext x10 Half kneel into knee flexion on L x10 4 way hip RTB looped attached to furniture 2x 10 B Heel/toe raise 2x10 Single leg clock reach x10, on airex x10 Hamstring curl 35# 2x10 Knee ext 25# 2x10 Fwd and bwd amb x2 laps around gym Vaso x15 min, L knee low compression, 34 deg   PATIENT EDUCATION:  Education details: ongoing PT POC and continue with current HEP  Person educated: Patient Education method: Explanation, Demonstration, and Handouts Education comprehension: verbalized understanding, returned demonstration, and needs further education  HOME EXERCISE PROGRAM: Access Code: [email or web address] URL: [email or web address] Date: 03/31/2024 Prepared by: Braylin Clark  Exercises - Squat with Counter Support  - 1 x daily - 3 x weekly - 2 sets - 10 reps - 3 second hold - Standing Hip Extension with Resistance  - 1 x daily - 3 x weekly - 2 sets - 10 reps - Standing Hip Adduction with Resistance  - 1 x daily - 3 x weekly - 2 sets - 10 reps - Standing Hip Flexion with Resistance  - 1 x daily - 3 x weekly - 2 sets - 10 reps - Standing Hip Abduction with Resistance  - 1 x daily - 3 x weekly - 2 sets - 10 reps - Sitting Knee Extension with Resistance  - 1 x daily - 3 x weekly - 2 sets - 10 reps   ASSESSMENT:  CLINICAL IMPRESSION: Ms. Ronelle has met all of her LTGs at this time. Reviewed machine exercises for the gym and went over the correct weight for her. Still has some R LE decreased balance; however, pt attributes this to her chronic ankle issues. Able to finalize HEP for pt that focused on further increasing her range at home as well as concentric and eccentric strengthening. Unable to come down stairs in reciprocal pattern due to L quad eccentric strength is still somewhat limited. Discussed exercises to continue to work on this at home. Pt verbalizes  understanding. Discussed going on a hold for pt to work independently and then she can call the clinic if she has any problems or issues to see us  before her POC ends in the beginning of June.   OBJECTIVE IMPAIRMENTS: Abnormal gait, decreased activity tolerance, decreased balance, decreased mobility, difficulty walking, decreased ROM, decreased strength, hypomobility, increased edema, increased fascial restrictions, impaired flexibility, impaired sensation, improper body mechanics, postural dysfunction, and pain.     GOALS: Goals reviewed with patient? Yes   SHORT TERM GOALS: Target date: 04/19/2024  Independent with initial HEP. Baseline: Will provide next session Goal status: MET - 04/12/24  2.  Pt will have improved L knee AROM from 5 to 110 deg Baseline: 10-108 deg Goal status: MET - 04/14/24 - see ROM chart   LONG TERM GOALS: Target date:  05/10/2024  Independent with advanced/ongoing HEP to improve outcomes and carryover.  Baseline:  Goal status: IN PROGRESS  2.  [name] will demonstrate L knee flexion to 115 deg to ascend/descend stairs. Baseline: 110 PROM, 108 AROM on eval Goal status: MET 04/14/24  3.  [name] will demonstrate full L knee extension for safety with gait. Baseline: 5 deg from full ext PROM, 10 deg from full ext on eval AROM Goal status: MET 04/14/24  4.  Marlow Berenguer will be able to ambulate 600' safely with LRAD and normal gait pattern to access community.  Baseline: Mostly in the house only without a/d Goal status: MET 04/14/24 -- shopped around [employer] without any a/d  5.  Tyra Michelle will be able to ascend/descend stairs with 1 HR and reciprocal step pattern safely to access home and community.  Baseline: Requires step to pattern with hand rail  Goal status: MET  6.  [name] will demonstrate > 28/30 on FGA to demonstrate decreased risk of falls.   Baseline: 24/30 28/30 on 04/19/24 Goal status: MET  7.   [name] will demonstrate 48/80 on LEFS to demonstrate MCID.  Baseline: 39/80 52/80 on 04/19/24 Goal status: MET    PLAN:  PT FREQUENCY: 2x/week  PT DURATION: 6 weeks  PLANNED INTERVENTIONS: 97164- PT Re-evaluation, 97110-Therapeutic exercises, 97530- Therapeutic activity, 97112- Neuromuscular re-education, 97535- Self Care, 02859- Manual therapy, [phone]- Gait training, [phone]- Aquatic Therapy, [phone]- Electrical stimulation (unattended), 97016- Vasopneumatic device, [phone]- Ionotophoresis 4mg /ml Dexamethasone , Patient/Family education, Balance training, Stair training, Taping, Dry Needling, Joint mobilization, Spinal mobilization, Cryotherapy, and Moist heat  PLAN FOR NEXT SESSION: Update pt's HEP accordingly. Work on endurance, ROM, balance, and progressive strengthening on L LE.    Julia Kulzer April Ma L Johni Narine, PT 04/19/2024, 4:31 PM

## 2024-04-19 NOTE — Progress Notes (Signed)
 The patient is here at 6 weeks status post a left total knee replacement.  She is an active and motivated 68 year old female.  She reports good range of motion and strength overall.  On my exam her extension is full and her flexion is almost full of that left knee and it feels ligament is stable.  There are still some warmth and swelling to be expected but she looks like for along the most people.  Her calf is soft.  She has normal physical therapy visit and I think that can be her last and she does have a gym that she is going to go to.  From our standpoint we will see her back in 3 months

## 2024-04-27 ENCOUNTER — Ambulatory Visit: Admitting: Physical Therapy

## 2024-04-29 ENCOUNTER — Encounter: Admitting: Physical Therapy

## 2024-05-05 ENCOUNTER — Encounter

## 2024-05-10 ENCOUNTER — Encounter: Admitting: Physical Therapy

## 2024-05-22 ENCOUNTER — Other Ambulatory Visit: Payer: Self-pay | Admitting: Family

## 2024-05-24 DIAGNOSIS — Z6841 Body Mass Index (BMI) 40.0 and over, adult: Secondary | ICD-10-CM | POA: Diagnosis not present

## 2024-05-27 ENCOUNTER — Encounter: Payer: Self-pay | Admitting: Family

## 2024-05-27 DIAGNOSIS — B351 Tinea unguium: Secondary | ICD-10-CM

## 2024-06-01 ENCOUNTER — Ambulatory Visit: Admitting: Family

## 2024-06-03 DIAGNOSIS — Z6841 Body Mass Index (BMI) 40.0 and over, adult: Secondary | ICD-10-CM | POA: Diagnosis not present

## 2024-06-07 ENCOUNTER — Ambulatory Visit: Admitting: Podiatry

## 2024-06-07 ENCOUNTER — Encounter: Payer: Self-pay | Admitting: Podiatry

## 2024-06-07 DIAGNOSIS — M2141 Flat foot [pes planus] (acquired), right foot: Secondary | ICD-10-CM

## 2024-06-07 DIAGNOSIS — L603 Nail dystrophy: Secondary | ICD-10-CM

## 2024-06-07 DIAGNOSIS — M25571 Pain in right ankle and joints of right foot: Secondary | ICD-10-CM | POA: Diagnosis not present

## 2024-06-07 MED ORDER — TRIAMCINOLONE ACETONIDE 10 MG/ML IJ SUSP
2.5000 mg | Freq: Once | INTRAMUSCULAR | Status: AC
  Administered 2024-06-07: 2.5 mg via INTRA_ARTICULAR

## 2024-06-07 MED ORDER — DEXAMETHASONE SODIUM PHOSPHATE 120 MG/30ML IJ SOLN
4.0000 mg | Freq: Once | INTRAMUSCULAR | Status: AC
  Administered 2024-06-07: 4 mg via INTRA_ARTICULAR

## 2024-06-07 NOTE — Progress Notes (Signed)
  Subjective:  Patient ID: Beth [name], adult    DOB: [birth year],   MRN: [id number]  Chief Complaint  Patient presents with   Nail Problem    Fungus nail right second toe. Lateral foot pain right. Pain comes and goes. 0 pain at the moment. Non diabetic.    68 y.o. adult presents for a couple of concerns as above. Relates mostly pain in the lateral part of her foot and ankle that comes and goes. She has a history of flat feet and has had PT surgery and FDL transfer. Relates still gets flat feet. She has tried orthotics but still getting pain. She would like to see if something more can be done. She also relates concern for thickening of her second right toenail. She has been on a round of lamisil  and no relief.  . Denies any other pedal complaints. Denies n/v/f/c.   Past Medical History:  Diagnosis Date   Anxiety    Arthritis    Depression    Sleep apnea    does not uses CPAP; uses mouth guard.     Objective:  Physical Exam: Vascular: DP/PT pulses 2/4 bilateral. CFT <3 seconds. Normal hair growth on digits. No edema.  Skin. No lacerations or abrasions bilateral feet. Right second digit mildly thickened.  Musculoskeletal: MMT 5/5 bilateral lower extremities in DF, PF, Inversion and Eversion. Deceased ROM in DF of ankle joint. Pes planus noted severely on right with increased abduction of forefoot and eversion of rear foot. Pain over sinus tarsi area to palpation and notable impingement in the area.  Neurological: Sensation intact to light touch.   Assessment:   1. Acquired pes planus, right   2. Sinus tarsitis, right   3. Onychodystrophy      Plan:  Patient was evaluated and treated and all questions answered. -Xrays deferred today. Patient refused.  -Discussed treatement options; discussed pes planus deformity and impingement and sinus tarsiitsi ;conservative and  surgical  -Discussed inserts and continue support.  -Recommend good supportive shoes -Recommend daily  stretching and icing- -Injection offered today Procedure below.  -Discussed nail changes and likely due to trauma. Discussed onychodystrophic and treatments incluidng filing nail and nail softeners.  -Patient to return to office as needed or sooner if condition worsens.    Asberry Failing, DPM

## 2024-06-12 ENCOUNTER — Other Ambulatory Visit: Payer: Self-pay | Admitting: Pulmonary Disease

## 2024-06-13 NOTE — Telephone Encounter (Signed)
 LOV 02/02/24, for OSA. Last refill 02/08/34, with 3 refills.  Message routed to Dr. Neda to advise.

## 2024-06-17 DIAGNOSIS — E669 Obesity, unspecified: Secondary | ICD-10-CM | POA: Diagnosis not present

## 2024-06-17 DIAGNOSIS — Z713 Dietary counseling and surveillance: Secondary | ICD-10-CM | POA: Diagnosis not present

## 2024-06-29 DIAGNOSIS — H25013 Cortical age-related cataract, bilateral: Secondary | ICD-10-CM | POA: Diagnosis not present

## 2024-06-29 DIAGNOSIS — H5203 Hypermetropia, bilateral: Secondary | ICD-10-CM | POA: Diagnosis not present

## 2024-06-29 DIAGNOSIS — H52223 Regular astigmatism, bilateral: Secondary | ICD-10-CM | POA: Diagnosis not present

## 2024-06-29 DIAGNOSIS — D3131 Benign neoplasm of right choroid: Secondary | ICD-10-CM | POA: Diagnosis not present

## 2024-06-29 DIAGNOSIS — H524 Presbyopia: Secondary | ICD-10-CM | POA: Diagnosis not present

## 2024-07-13 DIAGNOSIS — E669 Obesity, unspecified: Secondary | ICD-10-CM | POA: Diagnosis not present

## 2024-07-13 DIAGNOSIS — Z6837 Body mass index (BMI) 37.0-37.9, adult: Secondary | ICD-10-CM | POA: Diagnosis not present

## 2024-07-19 ENCOUNTER — Other Ambulatory Visit (INDEPENDENT_AMBULATORY_CARE_PROVIDER_SITE_OTHER): Payer: Self-pay

## 2024-07-19 ENCOUNTER — Encounter: Payer: Self-pay | Admitting: Orthopaedic Surgery

## 2024-07-19 ENCOUNTER — Ambulatory Visit: Admitting: Podiatry

## 2024-07-19 ENCOUNTER — Ambulatory Visit: Admitting: Orthopaedic Surgery

## 2024-07-19 DIAGNOSIS — Z96652 Presence of left artificial knee joint: Secondary | ICD-10-CM | POA: Diagnosis not present

## 2024-07-19 NOTE — Progress Notes (Signed)
 The patient is now past 4 months status post a left total knee replacement to treat significant left knee arthritis and pain.  She had her right knee replaced over a decade ago.  That knee is doing well for her.  We replaced her left knee secondary to severe arthritis.  She says that she is walking better overall and even her posture is improved.  She says it does click when she walks but no pain.  Examination of her left knee today surprisingly shows not much swelling at all and there is excellent range of motion of the knee.  There is a little bit of clicking of the components which I think can get better the further she gets her from surgery and strengthen her quads more.  She still may expect some clicking though.  Standing AP and lateral of the left knee today also shows the right knee.  Both knees are in neutral alignment and the more recent left operative knee shows that is well-seated.  The next time he is here is not for 6 months unless she is having issues.  Will have an AP and lateral of her left knee at that visit.

## 2024-07-28 ENCOUNTER — Ambulatory Visit: Admitting: Podiatry

## 2024-07-28 ENCOUNTER — Encounter: Payer: Self-pay | Admitting: Podiatry

## 2024-07-28 DIAGNOSIS — M2141 Flat foot [pes planus] (acquired), right foot: Secondary | ICD-10-CM

## 2024-07-28 DIAGNOSIS — M25571 Pain in right ankle and joints of right foot: Secondary | ICD-10-CM | POA: Diagnosis not present

## 2024-07-28 MED ORDER — DEXAMETHASONE SODIUM PHOSPHATE 120 MG/30ML IJ SOLN
4.0000 mg | Freq: Once | INTRAMUSCULAR | Status: AC
  Administered 2024-07-28: 4 mg via INTRA_ARTICULAR

## 2024-07-28 MED ORDER — TRIAMCINOLONE ACETONIDE 10 MG/ML IJ SUSP
2.5000 mg | Freq: Once | INTRAMUSCULAR | Status: AC
  Administered 2024-07-28: 2.5 mg via INTRA_ARTICULAR

## 2024-07-28 NOTE — Progress Notes (Signed)
  Subjective:  Patient ID: Beth [name], adult    DOB: Jul 23, 1956,   MRN: [id number]  Chief Complaint  Patient presents with   Foot Pain    It was doing pretty good until I stepped on it wrong yesterday.    68 y.o. adult presents for follow-up of pes planus and sinus tarsitis. Relates she was doing well and yesterday she stepped wrong and painful again. Denies any other pedal complaints. Denies n/v/f/c.   Past Medical History:  Diagnosis Date   Anxiety    Arthritis    Depression    Sleep apnea    does not uses CPAP; uses mouth guard.     Objective:  Physical Exam: Vascular: DP/PT pulses 2/4 bilateral. CFT <3 seconds. Normal hair growth on digits. No edema.  Skin. No lacerations or abrasions bilateral feet. Right second digit mildly thickened.  Musculoskeletal: MMT 5/5 bilateral lower extremities in DF, PF, Inversion and Eversion. Deceased ROM in DF of ankle joint. Pes planus noted severely on right with increased abduction of forefoot and eversion of rear foot. Pain over sinus tarsi area to palpation and notable impingement in the area.  Neurological: Sensation intact to light touch.   Assessment:   1. Sinus tarsitis, right   2. Acquired pes planus, right       Plan:  Patient was evaluated and treated and all questions answered. -Xrays deferred today. Patient refused.  -Discussed treatement options; discussed pes planus deformity and impingement and sinus tarsiitsi ;conservative and  surgical  -Discussed inserts and continue support.  -Recommend good supportive shoes -Recommend daily stretching and icing- -Injection offered today Procedure below.   -Patient to return to office in 4 months or as needed or sooner if condition worsens.    Procedure: Injection Tendon/Ligament Discussed alternatives, risks, complications and verbal consent was obtained.  Location: Right sinus tarsi . Skin Prep: Alcohol. Injectate: 1cc 0.5% marcaine  plain, 1 cc dexamethasone  0.5 cc  kenalog   Disposition: Patient tolerated procedure well. Injection site dressed with a band-aid.  Post-injection care was discussed and return precautions discussed.    Beth [name], DPM

## 2024-07-29 DIAGNOSIS — L720 Epidermal cyst: Secondary | ICD-10-CM | POA: Diagnosis not present

## 2024-07-29 DIAGNOSIS — X32XXXS Exposure to sunlight, sequela: Secondary | ICD-10-CM | POA: Diagnosis not present

## 2024-07-29 DIAGNOSIS — C44619 Basal cell carcinoma of skin of left upper limb, including shoulder: Secondary | ICD-10-CM | POA: Diagnosis not present

## 2024-07-29 DIAGNOSIS — L570 Actinic keratosis: Secondary | ICD-10-CM | POA: Diagnosis not present

## 2024-07-29 DIAGNOSIS — L814 Other melanin hyperpigmentation: Secondary | ICD-10-CM | POA: Diagnosis not present

## 2024-07-29 DIAGNOSIS — D485 Neoplasm of uncertain behavior of skin: Secondary | ICD-10-CM | POA: Diagnosis not present

## 2024-07-29 DIAGNOSIS — D1801 Hemangioma of skin and subcutaneous tissue: Secondary | ICD-10-CM | POA: Diagnosis not present

## 2024-07-29 DIAGNOSIS — L821 Other seborrheic keratosis: Secondary | ICD-10-CM | POA: Diagnosis not present

## 2024-08-03 ENCOUNTER — Telehealth: Payer: Self-pay | Admitting: Family

## 2024-08-03 ENCOUNTER — Ambulatory Visit (INDEPENDENT_AMBULATORY_CARE_PROVIDER_SITE_OTHER)

## 2024-08-03 VITALS — Ht 63.0 in | Wt 205.0 lb

## 2024-08-03 DIAGNOSIS — Z0000 Encounter for general adult medical examination without abnormal findings: Secondary | ICD-10-CM | POA: Diagnosis not present

## 2024-08-03 NOTE — Progress Notes (Signed)
 Subjective:   Beth [name] is a 68 y.o. who presents for a Medicare Wellness preventive visit.  As a reminder, Annual Wellness Visits don't include a physical exam, and some assessments may be limited, especially if this visit is performed virtually. We may recommend an in-person follow-up visit with your provider if needed.  Visit Complete: Virtual I connected with  Beth [name] on 08/03/24 by a audio enabled telemedicine application and verified that I am speaking with the correct person using two identifiers.  Patient Location: Home  Provider Location: Home Office  I discussed the limitations of evaluation and management by telemedicine. The patient expressed understanding and agreed to proceed.  Vital Signs: Because this visit was a virtual/telehealth visit, some criteria may be missing or patient reported. Any vitals not documented were not able to be obtained and vitals that have been documented are patient reported.    Persons Participating in Visit: Patient.  AWV Questionnaire: No: Patient Medicare AWV questionnaire was not completed prior to this visit.  Cardiac Risk Factors include: advanced age (>50men, >26 women)     Objective:    Today's Vitals   08/03/24 1455  Weight: 205 lb (93 kg)  Height: 5' 3 (1.6 m)   Body mass index is 36.31 kg/m.     08/03/2024    3:05 PM 03/29/2024    2:48 PM 03/09/2024    3:00 PM 03/01/2024   10:36 AM 08/05/2023    2:30 PM 04/12/2020    3:22 PM 03/24/2020   10:31 AM  Advanced Directives  Does Patient Have a Medical Advance Directive? No No No No No Yes Yes  Type of Careers adviser;Living will Healthcare Power of [place];Living will  Does patient want to make changes to medical advance directive?       No - Patient declined  Copy of Healthcare Power of Attorney in Chart?      No - copy requested No - copy requested  Would patient like information on creating a medical advance directive?  No - Patient declined No - Patient declined  Yes (MAU/Ambulatory/Procedural Areas - Information given) No - Patient declined      Current Medications (verified) Outpatient Encounter Medications as of 08/03/2024  Medication Sig   aspirin  81 MG chewable tablet Chew 1 tablet (81 mg total) by mouth 2 (two) times daily. (Patient not taking: Reported on 08/03/2024)   b complex vitamins tablet Take 1 tablet by mouth daily.   CALCIUM MAGNESIUM  ZINC PO Take by mouth.   CALCIUM PO Take 1 tablet by mouth daily.   celecoxib  (CELEBREX ) 200 MG capsule TAKE 1 CAPSULE BY MOUTH EVERY DAY   Cholecalciferol  (VITAMIN D ) 50 MCG (2000 UT) CAPS Take 2,000 Units by mouth daily.   ferrous sulfate  325 (65 FE) MG tablet Take 650 mg by mouth daily with breakfast.   Magnesium  250 MG CAPS Take by mouth.   magnesium  oxide (MAG-OX) 400 (240 Mg) MG tablet Take 400 mg by mouth daily.   Omega-3 Fatty Acids (FISH OIL ) 1000 MG CAPS Take 3 capsules (3,000 mg total) by mouth in the morning and at bedtime. (Patient taking differently: Take 3 capsules by mouth daily.)   oxyCODONE  (OXY IR/ROXICODONE ) 5 MG immediate release tablet Take 1-2 tablets (5-10 mg total) by mouth every 6 (six) hours as needed for moderate pain (pain score 4-6) (pain score 4-6). (Patient not taking: Reported on 08/03/2024)   Phentermine HCl (LOMAIRA) 8 MG  TABS Take by mouth.   sertraline  (ZOLOFT ) 100 MG tablet TAKE 1 TABLET BY MOUTH EVERY DAY   tiZANidine  (ZANAFLEX ) 2 MG tablet Take 1 tablet (2 mg total) by mouth every 6 (six) hours as needed for muscle spasms.   topiramate (TOPAMAX) 50 MG tablet Take 50 mg by mouth daily.   triamcinolone  cream (KENALOG ) 0.1 % APPLY 1 APPLICATION TOPICALLY 2 (TWO) TIMES DAILY. TO EAR CANALS (Patient taking differently: Apply 1 Application topically 2 (two) times daily as needed (itching). To ear canals)   zolpidem  (AMBIEN ) 10 MG tablet Take 1 tablet (10 mg total) by mouth at bedtime.   No facility-administered encounter  medications on file as of 08/03/2024.    Allergies (verified) Patient has no known allergies.   History: Past Medical History:  Diagnosis Date   Anxiety    Arthritis    Depression    Sleep apnea    does not uses CPAP; uses mouth guard.    Past Surgical History:  Procedure Laterality Date   ANKLE SURGERY Right 2008   BACK SURGERY  2009   BUNIONECTOMY Right    COLONOSCOPY     COSMETIC SURGERY     JOINT REPLACEMENT  '13, '21   r knee, r shoulder   NISSEN FUNDOPLICATION     REPLACEMENT TOTAL KNEE Right 2012   SPINE SURGERY  '09   back   TONSILLECTOMY     TOTAL KNEE ARTHROPLASTY Left 03/09/2024   Procedure: ARTHROPLASTY, KNEE, TOTAL;  Surgeon: Vernetta Lonni GRADE, MD;  Location: [facility] OR;  Service: Orthopedics;  Laterality: Left;   TOTAL SHOULDER ARTHROPLASTY Right 03/28/2020   Procedure: RIGHT SHOULDER REPLACEMENT;  Surgeon: Addie Cordella Hamilton, MD;  Location: [facility] OR;  Service: Orthopedics;  Laterality: Right;   Family History  Problem Relation Age of Onset   Breast cancer Mother 63   Cancer Mother    Depression Mother    Diabetes Father    Hydrocephalus Father    Lupus Maternal Aunt    Tuberculosis Maternal Grandmother    Alcohol abuse Maternal Grandfather        suicide   Diabetes Mellitus II Paternal Grandmother    Peripheral vascular disease Paternal Grandmother    Colon cancer Neg Hx    Colon polyps Neg Hx    Esophageal cancer Neg Hx    Stomach cancer Neg Hx    Rectal cancer Neg Hx    Social History   Socioeconomic History   Marital status: Married    Spouse name: Beth [name]   Number of children: 2   Years of education: Not on file   Highest education level: Not on file  Occupational History   Occupation: retired  Tobacco Use   Smoking status: Former    Current packs/day: 0.00    Types: Cigarettes    Quit date: 07/22/1989    Years since quitting: 35.0   Smokeless tobacco: Never  Vaping Use   Vaping status: Never Used  Substance and Sexual Activity    Alcohol use: Not Currently    Comment: social, couple times a month   Drug use: Never   Sexual activity: Yes    Partners: Male  Other Topics Concern   Not on file  Social History Narrative   Retired Music therapist   Divorced (from [place]  originally)   Remarried 2015 (husband is a IT trainer)   Has a dog   2 children (son and daughter) both married, daughter lives in [place], son in [place]. No grandchildren  Enjoys baking, sewing, reading   Social Drivers of Health   Financial Resource Strain: Low Risk  (08/04/2023)   Overall Financial Resource Strain (CARDIA)    Difficulty of Paying Living Expenses: Not hard at all  Food Insecurity: No Food Insecurity (08/03/2024)   Hunger Vital Sign    Worried About Running Out of Food in the Last Year: Never true    Ran Out of Food in the Last Year: Never true  Transportation Needs: No Transportation Needs (08/03/2024)   PRAPARE - Administrator, Civil Service (Medical): No    Lack of Transportation (Non-Medical): No  Physical Activity: Insufficiently Active (08/03/2024)   Exercise Vital Sign    Days of Exercise per Week: 3 days    Minutes of Exercise per Session: 20 min  Stress: No Stress Concern Present (08/04/2023)   [employer] of Occupational Health - Occupational Stress Questionnaire    Feeling of Stress : Not at all  Social Connections: Socially Integrated (08/03/2024)   Social Connection and Isolation Panel    Frequency of Communication with Friends and Family: More than three times a week    Frequency of Social Gatherings with Friends and Family: More than three times a week    Attends Religious Services: More than 4 times per year    Active Member of [employer] or Organizations: Yes    Attends Engineer, structural: More than 4 times per year    Marital Status: Married    Tobacco Counseling Counseling given: Not Answered    Clinical Intake:  Pre-visit preparation completed: Yes  Pain : No/denies pain     BMI -  recorded: 36.31 Nutritional Status: BMI > 30  Obese Nutritional Risks: None Diabetes: No  Lab Results  Component Value Date   HGBA1C 5.6 07/22/2023     How often do you need to have someone help you when you read instructions, pamphlets, or other written materials from your doctor or pharmacy?: 1 - Never  Interpreter Needed?: No  Information entered by :: Rojelio [name] LPN   Activities of Daily Living     08/03/2024    3:03 PM 03/09/2024    3:00 PM  In your present state of health, do you have any difficulty performing the following activities:  Hearing? 0 0  Vision? 0 0  Difficulty concentrating or making decisions? 0 0  Walking or climbing stairs? 0   Dressing or bathing? 0   Doing errands, shopping? 0 0  Preparing Food and eating ? N   Using the Toilet? N   In the past six months, have you accidently leaked urine? N   Do you have problems with loss of bowel control? N   Managing your Medications? N   Managing your Finances? N   Housekeeping or managing your Housekeeping? N     Patient Care Team: O'Sullivan, Melissa, NP as PCP - General (Internal Medicine)  I have updated your Care Teams any recent Medical Services you may have received from other providers in the past year.     Assessment:   This is a routine wellness examination for Beth [name].  Hearing/Vision screen Hearing Screening - Comments:: Denies hearing difficulties   Vision Screening - Comments:: Wears rx glasses - up to date with routine eye exams with  [facility]   Goals Addressed               This Visit's Progress     Increase physical activity (pt-stated)  Get more active.       Depression Screen     08/03/2024    3:07 PM 08/05/2023    2:27 PM 07/22/2023   10:12 AM 04/15/2023    9:50 AM 01/14/2023   10:12 AM 07/16/2022   11:27 AM 06/01/2021    1:05 PM  PHQ 2/9 Scores  PHQ - 2 Score 0 0 1 0 2 1 1   PHQ- 9 Score    0 5 11     Fall Risk     08/03/2024    3:04 PM 02/02/2024     9:38 AM 08/04/2023    4:20 PM 07/22/2023   10:12 AM 04/15/2023    9:50 AM  Fall Risk   Falls in the past year? 0 0 0 0 0  Number falls in past yr: 0  0 0 0  Injury with Fall? 0  0 0 0  Risk for fall due to : No Fall Risks  No Fall Risks No Fall Risks No Fall Risks  Follow up Falls evaluation completed  Falls evaluation completed Falls evaluation completed Falls evaluation completed    MEDICARE RISK AT HOME:  Medicare Risk at Home Any stairs in or around the home?: Yes If so, are there any without handrails?: No Home free of loose throw rugs in walkways, pet beds, electrical cords, etc?: Yes Adequate lighting in your home to reduce risk of falls?: Yes Life alert?: No Use of a cane, walker or w/c?: No Grab bars in the bathroom?: No Shower chair or bench in shower?: No Elevated toilet seat or a handicapped toilet?: No  TIMED UP AND GO:  Was the test performed?  No  Cognitive Function: 6CIT completed        08/03/2024    3:05 PM 08/05/2023    2:27 PM  6CIT Screen  What Year? 0 points 0 points  What month? 0 points 0 points  What time? 0 points 0 points  Count back from 20 0 points 0 points  Months in reverse 0 points 0 points  Repeat phrase 0 points 0 points  Total Score 0 points 0 points    Immunizations Immunization History  Administered Date(s) Administered   Influenza,inj,Quad PF,6+ Mos 10/10/2015, 09/13/2017, 09/06/2019   Influenza-Unspecified 09/21/2013, 09/28/2014, 10/10/2015, 10/16/2018, 09/01/2019, 08/23/2020, 08/23/2021, 09/01/2022   Moderna Sars-Covid-2 Vaccination 05/05/2020, 06/02/2020, 01/09/2021   PNEUMOCOCCAL CONJUGATE-20 07/16/2022   Pfizer Covid-19 Vaccine Bivalent Booster 48yrs & up 01/15/2022   Td (Adult),5 Lf Tetanus Toxid, Preservative Free 07/22/2023   Tdap 09/17/2011   Zoster Recombinant(Shingrix ) 09/13/2017, 10/26/2019    Screening Tests Health Maintenance  Topic Date Due   COVID-19 Vaccine (5 - 2024-25 season) 08/10/2023   INFLUENZA  VACCINE  07/09/2024   Medicare Annual Wellness (AWV)  08/03/2025   MAMMOGRAM  08/11/2025   Colonoscopy  08/15/2032   DTaP/Tdap/Td (3 - Td or Tdap) 07/21/2033   Pneumococcal Vaccine: 50+ Years  Completed   DEXA SCAN  Completed   Zoster Vaccines- Shingrix   Completed   HPV VACCINES  Aged Out   Meningococcal B Vaccine  Aged Out   Hepatitis C Screening  Discontinued    Health Maintenance  Health Maintenance Due  Topic Date Due   COVID-19 Vaccine (5 - 2024-25 season) 08/10/2023   INFLUENZA VACCINE  07/09/2024   Health Maintenance Items Addressed:   Additional Screening:  Vision Screening: Recommended annual ophthalmology exams for early detection of glaucoma and other disorders of the eye. Would you like a referral to an  eye doctor? No    Dental Screening: Recommended annual dental exams for proper oral hygiene  Community Resource Referral / Chronic Care Management: CRR required this visit?  No   CCM required this visit?  No   Plan:    I have personally reviewed and noted the following in the patient's chart:   Medical and social history Use of alcohol, tobacco or illicit drugs  Current medications and supplements including opioid prescriptions. Patient is not currently taking opioid prescriptions. Functional ability and status Nutritional status Physical activity Advanced directives List of other physicians Hospitalizations, surgeries, and ER visits in previous 12 months Vitals Screenings to include cognitive, depression, and falls Referrals and appointments  In addition, I have reviewed and discussed with patient certain preventive protocols, quality metrics, and best practice recommendations. A written personalized care plan for preventive services as well as general preventive health recommendations were provided to patient.   Rojelio LELON [name], LPN   1/[id number]/[id number]   After Visit Summary: (MyChart) Due to this being a telephonic visit, the after visit summary with  patients personalized plan was offered to patient via MyChart   Notes: Nothing significant to report at this time.

## 2024-08-03 NOTE — Patient Instructions (Addendum)
 [name] , Thank you for taking time out of your busy schedule to complete your Annual Wellness Visit with me. I enjoyed our conversation and look forward to speaking with you again next year. I, as well as your care team,  appreciate your ongoing commitment to your health goals. Please review the following plan we discussed and let me know if I can assist you in the future. Your Game plan/ To Do List    Referrals: If you haven't heard from the office you've been referred to, please reach out to them at the phone provided.   Follow up Visits: We will see or speak with you next year for your Next Medicare AWV with our clinical staff 08/09/25 @ 1:10p  Have you seen your provider in the last 6 months (3 months if uncontrolled diabetes)?   Clinician Recommendations:  Aim for 30 minutes of exercise or brisk walking, 6-8 glasses of water , and 5 servings of fruits and vegetables each day.        This is a list of the screenings recommended for you:  Health Maintenance  Topic Date Due   COVID-19 Vaccine (5 - 2024-25 season) 08/10/2023   Flu Shot  07/09/2024   Medicare Annual Wellness Visit  08/03/2025   Mammogram  08/11/2025   Colon Cancer Screening  08/15/2032   DTaP/Tdap/Td vaccine (3 - Td or Tdap) 07/21/2033   Pneumococcal Vaccine for age over 37  Completed   DEXA scan (bone density measurement)  Completed   Zoster (Shingles) Vaccine  Completed   HPV Vaccine  Aged Out   Meningitis B Vaccine  Aged Out   Hepatitis C Screening  Discontinued    Advanced directives: (Declined) Advance directive discussed with you today. Even though you declined this today, please call our office should you change your mind, and we can give you the proper paperwork for you to fill out. Advance Care Planning is important because it:  [x]  Makes sure you receive the medical care that is consistent with your values, goals, and preferences  [x]  It provides guidance to your family and loved ones and reduces  their decisional burden about whether or not they are making the right decisions based on your wishes.  Follow the link provided in your after visit summary or read over the paperwork we have mailed to you to help you started getting your Advance Directives in place. If you need assistance in completing these, please reach out to us  so that we can help you!  See attachments for Preventive Care and Fall Prevention Tips.

## 2024-08-03 NOTE — Telephone Encounter (Signed)
 Copied from CRM [id number]. Topic: Appointments - Appointment Cancel/Reschedule >> Aug 03, 2024  1:47 PM Drema MATSU wrote: Patient wants to reschedule appointment for AWV since she missed it. She stated that she received a call.

## 2024-08-11 DIAGNOSIS — C44619 Basal cell carcinoma of skin of left upper limb, including shoulder: Secondary | ICD-10-CM | POA: Diagnosis not present

## 2024-08-12 ENCOUNTER — Other Ambulatory Visit: Payer: Self-pay | Admitting: Family

## 2024-08-13 ENCOUNTER — Telehealth (HOSPITAL_BASED_OUTPATIENT_CLINIC_OR_DEPARTMENT_OTHER): Payer: Self-pay

## 2024-08-13 NOTE — Telephone Encounter (Signed)
 Copied from CRM [id number]. Topic: Clinical - Prescription Issue >> Aug 13, 2024  9:58 AM Benton O wrote: Reason for CRM: i called in my zoloft  to the [facility]pharmacy and they keep telling me its to soon and the weekend is coming up and i have three pills left and they sauy they want try till the 7th i dont know what to do cant sleep without pills . please give patient a call concerning this issue with the prescription sertraline  (ZOLOFT ) 100 MG tablet  [id number]

## 2024-08-25 DIAGNOSIS — Z6841 Body Mass Index (BMI) 40.0 and over, adult: Secondary | ICD-10-CM | POA: Diagnosis not present

## 2024-08-25 DIAGNOSIS — Z131 Encounter for screening for diabetes mellitus: Secondary | ICD-10-CM | POA: Diagnosis not present

## 2024-08-25 DIAGNOSIS — E66813 Obesity, class 3: Secondary | ICD-10-CM | POA: Diagnosis not present

## 2024-09-03 ENCOUNTER — Other Ambulatory Visit: Payer: Self-pay | Admitting: Pulmonary Disease

## 2024-09-03 MED ORDER — ZOLPIDEM TARTRATE 10 MG PO TABS: 10.0000 mg | ORAL_TABLET | Freq: Every day | ORAL | 2 refills | Status: DC

## 2024-09-06 ENCOUNTER — Other Ambulatory Visit: Payer: Self-pay | Admitting: Pulmonary Disease

## 2024-09-06 NOTE — Telephone Encounter (Unsigned)
 Copied from CRM [id number]. Topic: Clinical - Medication Refill >> Sep 06, 2024 10:40 AM Essie A wrote: Medication: zolpidem  (AMBIEN ) 10 MG tablet  Has the patient contacted their pharmacy? No, they would tell her to call the office (Agent: If no, request that the patient contact the pharmacy for the refill. If patient does not wish to contact the pharmacy document the reason why and proceed with request.) (Agent: If yes, when and what did the pharmacy advise?)  This is the patient's preferred pharmacy:  [facility] [address], [place] - [address] [place] [address] Phone: [phone] Fax: [phone]  Is this the correct pharmacy for this prescription? Yes If no, delete pharmacy and type the correct one.   Has the prescription been filled recently? Yes  Is the patient out of the medication? No  Has the patient been seen for an appointment in the last year OR does the patient have an upcoming appointment? Yes  Can we respond through MyChart? Yes  Agent: Please be advised that Rx refills may take up to 3 business days. We ask that you follow-up with your pharmacy.

## 2024-09-15 NOTE — Progress Notes (Signed)
 ------------------------------------------------------------------------------- Attestation with edits by Gregorio Myron Glorya Myra, MD at 09/27/2024  9:38 AM I was onsite and available for this visit on 09/23/2024. I have reviewed and agree with the above plan of care.  Gregorio RONAL EMERSON Myra, MD, [facility] Obesity Medicine / Internal Medicine [place] License [id number] [facility] License [id number] -------------------------------------------------------------------------------  Date: 09/23/2024 Visit Type: IBT (21:22)- final re-evaluation  Pre-BIA test questions  Does patient have a Pacemaker or implantable device? No If yes, STOP. Only measure weight.  BIA Device Used: SECA mBCA  If using Tanita, does patient have a BMI of 30 or lower?  Body composition measurements on the TANITA Scale are not validated for patients with a BMI over 30.  If measuring BIA -  Ask patient to remove all metal jewelry if possible.     Did patient empty bladder before start of appointment?  These goals are based off of patients current weight   Physiological Assessment Results Disclaimer: If patient did not follow preparation instructions, the body composition and/or resting metabolic rate data may be inaccurate. If patient is on beta blockers and calcium channel blockers could alter test results for aerobic fitness testing.   09/23/24 11:00  Age Today 84  Height 1.594 m (5' 2.75)  Weight 95.7 kg (211 lb)  Neck Circumference (in.) 15.5 inches  Waist Circumference (in.) 44 inches  Sex: Female  Did the patient adhere to preparation instructions? Yes  % Fat 50.3 %  Fat Mass (lbs) 106 lbs  Fat Free Mass (FFM) (lbs) 105 lbs  Muscle Mass (lbs) 43.1  Right Arm (lbs) 2.4  Left Arm (lbs) 2.4  Torso (lbs) 20.5  Right Leg (lbs) 8.8  Left Leg (lbs) 9  Total Body Water  (TBW) (lbs) 78.98 lbs  % Total Body Water  37.43  Body Composition Machine [id number]  RMR Predicted ([place] [employer]. Jeor) (kcals) 1459.25 kcals  Gait  Speed Trial 1 (seconds) 4.03  Gait Speed Trial 2 (seconds) 4.19  Gait Speed Average (seconds) 4.11  Gait Speed Average (m/s) 0.97  Gait Speed Average (mph) 2.17  Grip Strength (lbs) Trial 1 79.3  Grip Strength (lbs) Trial 2 75.3  Grip Strength (lbs) Trial 3 75.1  Average Grip Strength (lbs) 76.57  Hand used during grip strength Right  Grip Strength Fitness Rating: Excellent  Chair Rise Time 6.62  # Completed 5  Arms Used during chair rise? No  Chair Rise Fitness Rating: Above Average    Initial Fitness Assessment Results 11/04/23 13:00   Gait Speed Trial 1 (seconds) 4.44  Gait Speed Trial 2 (seconds) 4.9  Gait Speed Average (seconds) 4.67  Gait Speed Average (m/s) 0.86  Gait Speed Average (mph) 1.92  Grip Strength (lbs) Trial 1 78.4  Grip Strength (lbs) Trial 2 73.4  Grip Strength (lbs) Trial 3 67.4  Average Grip Strength (lbs) 73.07  Hand used during grip strength Right   Changes in Exercise Assessments Gait speed Improvement status: improved Chair rise Improvement status: improved Grip Strength Improvement status: improved Cardio fitness: -  Current Exercise: Trainer friend at the [employer] 2-3 times a week for about 90 minutes - stretching and full body focused, rower at home - 15 minutes 1-2 times a week, elliptical for 5 minutes for warmup before strength training at the [facility] Barriers: lack of motivation  Exercise Goal: EP recommended to add 5-10 minutes on the elliptical for the patient's warmup or incorporating 2 sessions a week of the rower at home for 15 minutes each session to improve cardiovascular  health. EP recommended the rower or the elliptical because they don't cause pain in the patient's foot or knees like walking does. EP suggested the patient to maintain her current strength training routine after reviewing body composition to aid in maintaining and slightly improving muscle mass and muscle strength.  Assessment and Plan This service is incident to care  directed by the medical provider, who was available virtually at time of service.  Reminder to check starting weight (week 1), to determine if patient is on track for 6 month weight loss goal of 6.6lbs.   Assessment:  As per consultation with pt and review of progress in the program, pt is motivated to follow currently prescribed IBT obesity treatment plan.   Advise: Instructed pt on goal of increasing cardio Agree:  Pt agrees to updated treatment plan and plan to achieve goal:  yes Assist: Pt was assisted with key concerns and addressing barriers to implementation of the prescribed treatment program and updated goals. At next visit please discuss: cardio routine Arrange:  Pt will f/u per IBT for obesity treatment protocol with the following adjustments: per protocol.    Time spent with patient: 30 minutes

## 2024-09-29 ENCOUNTER — Ambulatory Visit

## 2024-09-29 ENCOUNTER — Ambulatory Visit: Admitting: Podiatry

## 2024-09-29 DIAGNOSIS — M25571 Pain in right ankle and joints of right foot: Secondary | ICD-10-CM | POA: Diagnosis not present

## 2024-09-29 DIAGNOSIS — M2141 Flat foot [pes planus] (acquired), right foot: Secondary | ICD-10-CM | POA: Diagnosis not present

## 2024-09-29 NOTE — Progress Notes (Signed)
  Subjective:  Patient ID: Beth [name], female    DOB: [birth year],   MRN: [id number]  Chief Complaint  Patient presents with   Foot Pain    R sinus tarsitis follow up injection only lasted 3 weeks. Not diabetic no anti coag     68 y.o. female presents for follow-up of pes planus and sinus tarsitis.  Relates last injection only helped for 3 weeks.  She is hoping to do something other than injections today.. Denies any other pedal complaints. Denies n/v/f/c.   Interval history: Patient with history of right pes planus.  She has tried orthotics and good feet store inserts.  She has also had previous PT surgery and FDL transfer.  Past Medical History:  Diagnosis Date   Anxiety    Arthritis    Depression    Sleep apnea    does not uses CPAP; uses mouth guard.     Objective:  Physical Exam: Vascular: DP/PT pulses 2/4 bilateral. CFT <3 seconds. Normal hair growth on digits. No edema.  Skin. No lacerations or abrasions bilateral feet. Right second digit mildly thickened.  Musculoskeletal: MMT 5/5 bilateral lower extremities in DF, PF, Inversion and Eversion. Deceased ROM in DF of ankle joint. Pes planus noted severely on right with increased abduction of forefoot and eversion of rear foot. Pain over sinus tarsi area to palpation and notable impingement in the area.  Full range of motion of subtalar joint noted. Neurological: Sensation intact to light touch.   Assessment:   1. Sinus tarsitis, right   2. Acquired pes planus, right        Plan:  Patient was evaluated and treated and all questions answered. -Xrays offered today and patient agreed.  No acute fracture or dislocation.  Degenerative changes noted in subtalar joint with severe pes planus.  Anteater sign noted with possible calcaneal navicular coalition -Discussed treatement options; discussed pes planus deformity and impingement and sinus tarsiitsi ;conservative and  surgical.  Discussed possible calcaneonavicular  coalition. -Discussed inserts and continue support.  -Recommend good supportive shoes -Recommend daily stretching and icing Discussed additional options to include surgical intervention versus potentially PRP or injections although not sure how helpful these would be Would like to order CT scan to further evaluate for possible coalition or other bony abnormality in area. -discussed surgical options in detail. -Patient to return to office after CT scan    Asberry Failing, DPM

## 2024-09-30 ENCOUNTER — Encounter: Payer: Self-pay | Admitting: Podiatry

## 2024-10-04 ENCOUNTER — Ambulatory Visit
Admission: RE | Admit: 2024-10-04 | Discharge: 2024-10-04 | Disposition: A | Source: Ambulatory Visit | Attending: Podiatry

## 2024-10-04 DIAGNOSIS — M25571 Pain in right ankle and joints of right foot: Secondary | ICD-10-CM

## 2024-10-04 DIAGNOSIS — M19071 Primary osteoarthritis, right ankle and foot: Secondary | ICD-10-CM | POA: Diagnosis not present

## 2024-10-04 DIAGNOSIS — M2141 Flat foot [pes planus] (acquired), right foot: Secondary | ICD-10-CM

## 2024-10-07 ENCOUNTER — Other Ambulatory Visit: Payer: Self-pay | Admitting: Family

## 2024-10-07 ENCOUNTER — Other Ambulatory Visit: Payer: Self-pay | Admitting: Medical Genetics

## 2024-10-07 DIAGNOSIS — Z006 Encounter for examination for normal comparison and control in clinical research program: Secondary | ICD-10-CM

## 2024-10-11 ENCOUNTER — Ambulatory Visit: Admitting: Podiatry

## 2024-10-11 ENCOUNTER — Encounter: Payer: Self-pay | Admitting: Podiatry

## 2024-10-11 ENCOUNTER — Encounter: Payer: Self-pay | Admitting: Radiology

## 2024-10-11 DIAGNOSIS — M2141 Flat foot [pes planus] (acquired), right foot: Secondary | ICD-10-CM | POA: Diagnosis not present

## 2024-10-11 DIAGNOSIS — M19071 Primary osteoarthritis, right ankle and foot: Secondary | ICD-10-CM | POA: Diagnosis not present

## 2024-10-11 DIAGNOSIS — Q6689 Other  specified congenital deformities of feet: Secondary | ICD-10-CM

## 2024-10-11 NOTE — Progress Notes (Signed)
  Subjective:  Patient ID: Beth [name], female    DOB: 04/07/1956,   MRN: [id number]  Chief Complaint  Patient presents with   Arthritis    It's about the same.    68 y.o. female presents for follow-up of pes planus and sinus tarsitis. Here to review CT.   [name] Denies any other pedal complaints. Denies n/v/f/c.   Interval history: Patient with history of right pes planus.  She has tried orthotics and good feet store inserts.  She has also had previous PT surgery and FDL transfer.  Past Medical History:  Diagnosis Date   Anxiety    Arthritis    Depression    Sleep apnea    does not uses CPAP; uses mouth guard.     Objective:  Physical Exam: Vascular: DP/PT pulses 2/4 bilateral. CFT <3 seconds. Normal hair growth on digits. No edema.  Skin. No lacerations or abrasions bilateral feet. Right second digit mildly thickened.  Musculoskeletal: MMT 5/5 bilateral lower extremities in DF, PF, Inversion and Eversion. Deceased ROM in DF of ankle joint. Pes planus noted severely on right with increased abduction of forefoot and eversion of rear foot. Pain over sinus tarsi area to palpation and notable impingement in the area.  Limited range of motion of subtalar joint noted. Neurological: Sensation intact to light touch.   IMPRESSION: 1. Joint space narrowing with sclerosis at the articulation of the anterior process of the calcaneus and the lateral navicular, without solid osseous bridging. A component of fibrocartilaginous coalition at this articulation is suspected. 2. Severe calcaneocuboid osteoarthritis. 3. Thickened appearance of the tibialis posterior tendon distally, suggesting tendinosis.  Assessment:   1. Acquired pes planus, right   2. Coalition, calcaneus navicular   3. Arthritis of right subtalar joint         Plan:  Patient was evaluated and treated and all questions answered. -Xrays offered today and patient agreed.  No acute fracture or dislocation.   Degenerative changes noted in subtalar joint with severe pes planus.  Anteater sign noted with possible calcaneal navicular coalition -Discussed treatement options; discussed pes planus deformity and coalition of CN joint;conservative and  surgical.  -CT reviewed with patient and signes of CN fibrocartilaginous coalition noted as well as calcaenocuboid arthritis.  Discussed additional options to include surgical intervention. Discussed resection of CN bar and fusion of subtalar joint. Discussed having follow-up with one of my partners for further discussion of surgical options as I do not perform this procedure.  -Patient to return to office for further surgical consult.     Beth [name], DPM

## 2024-10-17 ENCOUNTER — Other Ambulatory Visit: Payer: Self-pay | Admitting: Family

## 2024-10-27 ENCOUNTER — Ambulatory Visit: Admitting: Podiatry

## 2024-10-27 ENCOUNTER — Encounter: Payer: Self-pay | Admitting: Podiatry

## 2024-10-27 VITALS — Ht 63.0 in | Wt 205.0 lb

## 2024-10-27 DIAGNOSIS — M2141 Flat foot [pes planus] (acquired), right foot: Secondary | ICD-10-CM

## 2024-10-27 DIAGNOSIS — M62461 Contracture of muscle, right lower leg: Secondary | ICD-10-CM | POA: Diagnosis not present

## 2024-10-27 NOTE — Progress Notes (Signed)
 Chief Complaint  Patient presents with   Flat Foot    Pt is here to discuss surgery options for the right foot.    HPI: 68 y.o. female presenting today for surgical consult of chronically severe painful flatfoot deformity to the right lower extremity with onset of arthritis and possible tarsal coalition.  Referred from Dr. Joya here in our practice for surgical consult.  Unfortunately she has had pain and tenderness to the right foot for several years.  She has tried multiple conservative modalities with no improvement.  Past Medical History:  Diagnosis Date   Anxiety    Arthritis    Depression    Sleep apnea    does not uses CPAP; uses mouth guard.     Past Surgical History:  Procedure Laterality Date   ANKLE SURGERY Right 2008   BACK SURGERY  2009   BUNIONECTOMY Right    COLONOSCOPY     COSMETIC SURGERY     JOINT REPLACEMENT  '13, '21   r knee, r shoulder   NISSEN FUNDOPLICATION     REPLACEMENT TOTAL KNEE Right 2012   SPINE SURGERY  '09   back   TONSILLECTOMY     TOTAL KNEE ARTHROPLASTY Left 03/09/2024   Procedure: ARTHROPLASTY, KNEE, TOTAL;  Surgeon: Vernetta Lonni GRADE, MD;  Location: [facility] OR;  Service: Orthopedics;  Laterality: Left;   TOTAL SHOULDER ARTHROPLASTY Right 03/28/2020   Procedure: RIGHT SHOULDER REPLACEMENT;  Surgeon: Addie Cordella Hamilton, MD;  Location: [facility] OR;  Service: Orthopedics;  Laterality: Right;    No Known Allergies   Physical Exam: General: The patient is alert and oriented x3 in no acute distress.  Dermatology: Skin is warm, dry and supple bilateral lower extremities.   Vascular: Palpable pedal pulses bilaterally. Capillary refill within normal limits.  No appreciable edema.  No erythema.  Neurological: Grossly intact via light touch  Musculoskeletal Exam: Pes planovalgus deformity noted with collapse of the medial longitudinal arch of the foot and rear foot valgus deformity with weightbearing.  CT FOOT RIGHT WO CONTRAST (Accession  [id number]) (Order [id number]) Imaging   IMPRESSION: 1. Joint space narrowing with sclerosis at the articulation of the anterior process of the calcaneus and the lateral navicular, without solid osseous bridging. A component of fibrocartilaginous coalition at this articulation is suspected. 2. Severe calcaneocuboid osteoarthritis. 3. Thickened appearance of the tibialis posterior tendon distally, suggesting tendinosis.  DG Foot Complete Right (Accession [id number]) (Order [id number]) Imaging Date: 09/29/2024 Department: [facility] Imaging Released By: Rinaldo Delon KIDD, CMA Authorizing: Joya Stabs, DPM  Pes planovalgus deformity noted especially on lateral view.  Dorsal spurring noted to the CC joint as well as irregularity to the anterior process of the calcaneus possibly concerning for tarsal coalition.  Assessment/Plan of Care: 1.  Pes planovalgus with advanced arthritis right foot 2.  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  Equinus deformity right 4.  Fibrocartilage coalition calcaneal navicular right  -Patient evaluated.  PMH and imaging reviewed.   -I do believe the patient would benefit from triple arthrodesis surgery with tendo Achilles lengthening to correct for the patient's chronic pain that she is experiencing.  Unfortunately conservative treatment management has been unsuccessful in providing any sort of lasting alleviation or relief of symptoms for the patient.  She continues to have pain and tenderness on a daily basis despite conservative orthotics and anti-inflammatory medication.  Patient is amenable to surgery -Risk benefits advantages and disadvantages of the procedure were explained  in length in detail to the patient.  She understands that she will be strictly nonweightbearing for minimum 8 weeks postoperatively.  This is also her right lower extremity so no driving for minimum 3-4 months.  All patient questions were  answered.  No guarantees were expressed or implied.  After discussion with the patient she would like to proceed with surgery at this time -Authorization for surgery was initiated today.  Surgery will consist of triple arthrodesis right.  Tendo Achilles lengthening right -Return to clinic 1 week postop  *Her husband is a long-haul truck driver but she does have church family and friends that can help and assist postoperatively       Beth [name], DPM [facility]  Dr. Thresa EMERSON [name], DPM    [address] [place], [place] [address]                Office [phone]  Fax [phone]

## 2024-11-01 ENCOUNTER — Telehealth: Payer: Self-pay | Admitting: Podiatry

## 2024-11-01 LAB — GENECONNECT MOLECULAR SCREEN: Genetic Analysis Overall Interpretation: NEGATIVE

## 2024-11-01 NOTE — Telephone Encounter (Signed)
 No knee scooter ordered as patient has been lent one by a friend

## 2024-11-01 NOTE — Telephone Encounter (Signed)
 Patient called wanting to schedule surgery. 12/4 and 12/11 were both discussed at patients appointment. Dr. Ernestine was already almost at capacity on both of these dates so after speaking with him 11/19/24 was the agreed upon date. Patient is able to do this as well/ Patient not on any GLP1 medications or blood thinners. Patient pharmacy is correct in chart.

## 2024-11-01 NOTE — Telephone Encounter (Signed)
 Patient wanting to know if she can receive a handicap placard for use after surgery. Please advise, thanks!

## 2024-11-01 NOTE — Telephone Encounter (Signed)
 DOS- 11/19/2024  TENDO ACHILLES LENGTH RT- 72314 TRIPLE ARTHRODESIS RT- 71284  [employer]ET[employer] EFFECTIVE DATE- 12/09/2022  DEDUCTIBLE- $0 REMAINING- $0 OOP- $4150 REMAINING- $3328.33 COINSURANCE- 0%  PER AVAILITY PORTAL, NO PRIOR AUTHS ARE REQUIRED FOR CPT CODES 72314 AND [phone]. DOCUMENTATION ATTACHED TO SURGERY CONSENT PACKET.

## 2024-11-11 ENCOUNTER — Telehealth: Payer: Self-pay | Admitting: Family

## 2024-11-11 NOTE — Telephone Encounter (Signed)
 Copied from CRM #[id number]. Topic: Appointments - Scheduling Inquiry for Clinic >> Nov 11, 2024 11:35 AM Beth [name] wrote: Reason for CRM: Patient called in regarding needing clearance for surgery needs to see her PCP NP Eleanor Ponto, first appointment isnt until January , would like to know if there is anyway she could be seen sooner   11/26/2024  Please advise if it would be ok to put her in a same day slot for this or if she should wait until January.

## 2024-11-12 ENCOUNTER — Ambulatory Visit: Payer: Self-pay | Admitting: Family

## 2024-11-12 ENCOUNTER — Telehealth: Payer: Self-pay | Admitting: Podiatry

## 2024-11-12 ENCOUNTER — Ambulatory Visit: Admitting: Family

## 2024-11-12 ENCOUNTER — Ambulatory Visit (HOSPITAL_BASED_OUTPATIENT_CLINIC_OR_DEPARTMENT_OTHER)
Admission: RE | Admit: 2024-11-12 | Discharge: 2024-11-12 | Disposition: A | Source: Ambulatory Visit | Attending: Family | Admitting: Family

## 2024-11-12 VITALS — BP 131/52 | HR 73 | Temp 98.4°F | Resp 16 | Ht 63.0 in | Wt 210.0 lb

## 2024-11-12 DIAGNOSIS — F32A Depression, unspecified: Secondary | ICD-10-CM | POA: Diagnosis not present

## 2024-11-12 DIAGNOSIS — Z01818 Encounter for other preprocedural examination: Secondary | ICD-10-CM

## 2024-11-12 DIAGNOSIS — G4700 Insomnia, unspecified: Secondary | ICD-10-CM

## 2024-11-12 DIAGNOSIS — G4733 Obstructive sleep apnea (adult) (pediatric): Secondary | ICD-10-CM | POA: Diagnosis not present

## 2024-11-12 DIAGNOSIS — R809 Proteinuria, unspecified: Secondary | ICD-10-CM

## 2024-11-12 DIAGNOSIS — Z96611 Presence of right artificial shoulder joint: Secondary | ICD-10-CM | POA: Diagnosis not present

## 2024-11-12 LAB — CBC WITH DIFFERENTIAL/PLATELET
Basophils Absolute: 0.1 K/uL (ref 0.0–0.1)
Basophils Relative: 0.6 % (ref 0.0–3.0)
Eosinophils Absolute: 0.2 K/uL (ref 0.0–0.7)
Eosinophils Relative: 1.9 % (ref 0.0–5.0)
HCT: 40.1 % (ref 36.0–46.0)
Hemoglobin: 13.5 g/dL (ref 12.0–15.0)
Lymphocytes Relative: 23.8 % (ref 12.0–46.0)
Lymphs Abs: 2 K/uL (ref 0.7–4.0)
MCHC: 33.7 g/dL (ref 30.0–36.0)
MCV: 92.7 fl (ref 78.0–100.0)
Monocytes Absolute: 0.4 K/uL (ref 0.1–1.0)
Monocytes Relative: 4.9 % (ref 3.0–12.0)
Neutro Abs: 5.7 K/uL (ref 1.4–7.7)
Neutrophils Relative %: 68.8 % (ref 43.0–77.0)
Platelets: 260 K/uL (ref 150.0–400.0)
RBC: 4.32 Mil/uL (ref 3.87–5.11)
RDW: 13.8 % (ref 11.5–15.5)
WBC: 8.3 K/uL (ref 4.0–10.5)

## 2024-11-12 LAB — COMPREHENSIVE METABOLIC PANEL WITH GFR
ALT: 15 U/L (ref 0–35)
AST: 18 U/L (ref 0–37)
Albumin: 3.8 g/dL (ref 3.5–5.2)
Alkaline Phosphatase: 88 U/L (ref 39–117)
BUN: 20 mg/dL (ref 6–23)
CO2: 30 meq/L (ref 19–32)
Calcium: 8.7 mg/dL (ref 8.4–10.5)
Chloride: 106 meq/L (ref 96–112)
Creatinine, Ser: 0.94 mg/dL (ref 0.40–1.20)
GFR: 62.83 mL/min (ref >60.00)
Glucose, Bld: 88 mg/dL (ref 70–99)
Potassium: 4.5 meq/L (ref 3.5–5.1)
Sodium: 141 meq/L (ref 135–145)
Total Bilirubin: 0.4 mg/dL (ref 0.2–1.2)
Total Protein: 5.3 g/dL — ABNORMAL LOW (ref 6.0–8.3)

## 2024-11-12 LAB — PROTIME-INR
INR: 1 ratio (ref 0.8–1.0)
Prothrombin Time: 11 s (ref 9.6–13.1)

## 2024-11-12 LAB — APTT: aPTT: 27.5 s (ref 25.4–36.8)

## 2024-11-12 MED ORDER — PHENTERMINE HCL 15 MG PO CAPS: 15.0000 mg | ORAL_CAPSULE | ORAL | Status: AC

## 2024-11-12 NOTE — Patient Instructions (Signed)
  VISIT SUMMARY: You came in today for a pre-operative evaluation for your upcoming right foot surgery. We discussed your history of a ruptured arch tendon and the resulting arthritis and flatfoot deformity, as well as your current medications and their effectiveness.  YOUR PLAN: -RIGHT FOOT ARTHRITIS AND TENDON RUPTURE WITH FLATFOOT DEFORMITY: You have chronic arthritis and a ruptured tendon in your right foot, which has led to a flatfoot deformity. This condition will be addressed with a triple arthrodesis and tendo Achilles lengthening surgery scheduled for December 19th. We have ordered a baseline chest x-ray and labs, including bleeding time and a urine culture as part of your surgical clearance for your planned surgery.  -DEPRESSION: Your depression is well-managed with sertraline , which you should continue taking at 100 mg daily.  -MORBID OBESITY: You have been diagnosed with morbid obesity, which means having an excessive amount of body fat. You are currently taking phentermine  15 mg daily to help with weight loss, and you should continue this medication.  -OBSTRUCTIVE SLEEP APNEA, ADULT: Obstructive sleep apnea is a condition where your breathing stops and starts during sleep. You have stopped using your CPAP machine after losing weight, but you are dissatisfied with your current sleep specialist and find zolpidem  ineffective for sleep. We have referred you to [facility] for further evaluation by a sleep specialist.

## 2024-11-12 NOTE — Assessment & Plan Note (Signed)
 Finding zolpidem  ineffective. Advised her that phentermine  can contribute to insomnia- though she is taking in the AM which is good.

## 2024-11-12 NOTE — Assessment & Plan Note (Signed)
  Chronic arthritis and tendon rupture with flatfoot deformity. Surgical intervention planned. - Ordered baseline chest x-ray for surgical clearance. - Ordered pre-op  labs  - EKG tracing is personally reviewed.  EKG notes NSR.  No acute changes.  Surgical clearance pending review of lab work and CXR.

## 2024-11-12 NOTE — H&P (View-Only) (Signed)
 Subjective:     Patient ID: Beth [name], female    DOB: 1956/06/05, 68 y.o.   MRN: [id number]  Chief Complaint  Patient presents with   Pre-op Exam    Patient scheduled for right foot surgery 11/26/24    HPI  Discussed the use of AI scribe software for clinical note transcription with the patient, who gave verbal consent to proceed.  History of Present Illness Beth [name] is a 68 year old female who presents for pre-operative evaluation for triple arthrodesis surgery with tendo Achilles lengthening of R foot 11/26/24 to correct for the patient's chronic pain that she is experiencing.   She has a history of a ruptured arch tendon in 2008 or 2009, which has progressively worsened, leading to arthritis and increased flatness of her foot. This condition has caused her ankle to roll, resulting in pain and difficulty walking. Stretching provides temporary relief, but the duration of relief is decreasing.  She has consulted three podiatrists before finding one who identified the problem and referred her to a careers adviser. She is scheduled for a triple arthrodesis with tendo Achilles lengthening on the right side on the 19th.  She is currently taking sertraline  for mood, which she finds effective, especially since her husband is also on it. She was previously taking topiramate for weight loss but discontinued it due to ineffectiveness. She continues to take phentermine  15 mg daily, which she finds energizing. This is being prescribed by her weight loss specialist.  She has stopped using her CPAP machine after losing weight and reports that zolpidem , prescribed for sleep, is not working well anymore. She has not had a follow-up sleep study since discontinuing CPAP.  Patient presents for pre-op evaluation for triple arthrodesis surgery with tendo Achilles lengthening of R foot 11/26/24 to correct for the patient's chronic pain that she is experiencing.      Health Maintenance Due   Topic Date Due   Influenza Vaccine  07/09/2024   COVID-19 Vaccine (5 - 2025-26 season) 08/09/2024    Past Medical History:  Diagnosis Date   Anxiety    Arthritis    Depression    Sleep apnea    does not uses CPAP; uses mouth guard.     Past Surgical History:  Procedure Laterality Date   ANKLE SURGERY Right 2008   BACK SURGERY  2009   BUNIONECTOMY Right    COLONOSCOPY     COSMETIC SURGERY     JOINT REPLACEMENT  '13, '21   r knee, r shoulder   NISSEN FUNDOPLICATION     REPLACEMENT TOTAL KNEE Right 2012   SPINE SURGERY  '09   back   TONSILLECTOMY     TOTAL KNEE ARTHROPLASTY Left 03/09/2024   Procedure: ARTHROPLASTY, KNEE, TOTAL;  Surgeon: Vernetta Lonni GRADE, MD;  Location: [facility] OR;  Service: Orthopedics;  Laterality: Left;   TOTAL SHOULDER ARTHROPLASTY Right 03/28/2020   Procedure: RIGHT SHOULDER REPLACEMENT;  Surgeon: Addie Cordella Hamilton, MD;  Location: [facility] OR;  Service: Orthopedics;  Laterality: Right;    Family History  Problem Relation Age of Onset   Breast cancer Mother 47   Cancer Mother    Depression Mother    Diabetes Father    Hydrocephalus Father    Lupus Maternal Aunt    Tuberculosis Maternal Grandmother    Alcohol abuse Maternal Grandfather        suicide   Diabetes Mellitus II Paternal Grandmother    Peripheral vascular disease Paternal Grandmother  Colon cancer Neg Hx    Colon polyps Neg Hx    Esophageal cancer Neg Hx    Stomach cancer Neg Hx    Rectal cancer Neg Hx     Social History   Socioeconomic History   Marital status: Married    Spouse name: Ron   Number of children: 2   Years of education: Not on file   Highest education level: Associate degree: occupational, scientist, product/process development, or vocational program  Occupational History   Occupation: retired  Tobacco Use   Smoking status: Former    Current packs/day: 0.00    Types: Cigarettes    Quit date: 07/22/1989    Years since quitting: 35.3   Smokeless tobacco: Never  Vaping Use   Vaping  status: Never Used  Substance and Sexual Activity   Alcohol use: Not Currently    Comment: social, couple times a month   Drug use: Never   Sexual activity: Yes    Partners: Male  Other Topics Concern   Not on file  Social History Narrative   Retired music therapist   Divorced (from [place]  originally)   Remarried 2015 (husband is a it trainer)   Has a dog   2 children (son and daughter) both married, daughter lives in [place], son in [place]. No grandchildren   Enjoys baking, sewing, reading   Social Drivers of Corporate Investment Banker Strain: Low Risk  (11/11/2024)   Overall Financial Resource Strain (CARDIA)    Difficulty of Paying Living Expenses: Not very hard  Food Insecurity: No Food Insecurity (11/11/2024)   Hunger Vital Sign    Worried About Running Out of Food in the Last Year: Never true    Ran Out of Food in the Last Year: Never true  Transportation Needs: No Transportation Needs (11/11/2024)   PRAPARE - Administrator, Civil Service (Medical): No    Lack of Transportation (Non-Medical): No  Physical Activity: Insufficiently Active (11/11/2024)   Exercise Vital Sign    Days of Exercise per Week: 3 days    Minutes of Exercise per Session: 30 min  Stress: No Stress Concern Present (11/11/2024)   [employer] of Occupational Health - Occupational Stress Questionnaire    Feeling of Stress: Only a little  Social Connections: Moderately Integrated (11/11/2024)   Social Connection and Isolation Panel    Frequency of Communication with Friends and Family: Twice a week    Frequency of Social Gatherings with Friends and Family: Twice a week    Attends Religious Services: More than 4 times per year    Active Member of [employer] or Organizations: No    Attends Engineer, Structural: Not on file    Marital Status: Married  Catering Manager Violence: Not At Risk (08/03/2024)   Humiliation, Afraid, Rape, and Kick questionnaire    Fear of Current or Ex-Partner: No     Emotionally Abused: No    Physically Abused: No    Sexually Abused: No    Outpatient Medications Prior to Visit  Medication Sig Dispense Refill   b complex vitamins tablet Take 1 tablet by mouth daily.     CALCIUM MAGNESIUM  ZINC PO Take by mouth.     CALCIUM PO Take 1 tablet by mouth daily.     celecoxib  (CELEBREX ) 200 MG capsule TAKE 1 CAPSULE BY MOUTH EVERY DAY 90 capsule 1   Cholecalciferol  (VITAMIN D ) 50 MCG (2000 UT) CAPS Take 2,000 Units by mouth daily.     ferrous sulfate   325 (65 FE) MG tablet Take 650 mg by mouth daily with breakfast.     Magnesium  250 MG CAPS Take by mouth.     magnesium  oxide (MAG-OX) 400 (240 Mg) MG tablet Take 400 mg by mouth daily.     Omega-3 Fatty Acids (FISH OIL ) 1000 MG CAPS Take 3 capsules (3,000 mg total) by mouth in the morning and at bedtime. (Patient taking differently: Take 3 capsules by mouth daily.)  0   sertraline  (ZOLOFT ) 100 MG tablet TAKE 1 TABLET BY MOUTH DAILY. NEEDS APPT 90 tablet 1   tiZANidine  (ZANAFLEX ) 2 MG tablet Take 1 tablet (2 mg total) by mouth every 6 (six) hours as needed for muscle spasms. 30 tablet 1   zolpidem  (AMBIEN ) 10 MG tablet Take 1 tablet (10 mg total) by mouth at bedtime. 30 tablet 2   Phentermine  HCl (LOMAIRA ) 8 MG TABS Take by mouth.     topiramate (TOPAMAX) 50 MG tablet Take 50 mg by mouth daily.     No facility-administered medications prior to visit.    No Known Allergies  ROS     Objective:    Physical Exam Constitutional:      General: She is not in acute distress.    Appearance: Normal appearance. She is well-developed.  HENT:     Head: Normocephalic and atraumatic.     Right Ear: Tympanic membrane, ear canal and external ear normal.     Left Ear: Tympanic membrane and external ear normal.  Eyes:     General: No scleral icterus. Neck:     Thyroid : No thyromegaly.  Cardiovascular:     Rate and Rhythm: Normal rate and regular rhythm.     Heart sounds: Normal heart sounds. No murmur  heard. Pulmonary:     Effort: Pulmonary effort is normal. No respiratory distress.     Breath sounds: Normal breath sounds. No wheezing.  Musculoskeletal:        General: No swelling.     Cervical back: Neck supple.  Lymphadenopathy:     Cervical: No cervical adenopathy.  Skin:    General: Skin is warm and dry.  Neurological:     General: No focal deficit present.     Mental Status: She is alert and oriented to person, place, and time.     Motor: No weakness.  Psychiatric:        Mood and Affect: Mood normal.        Behavior: Behavior normal.        Thought Content: Thought content normal.        Judgment: Judgment normal.      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 Surgical intervention planned. - Ordered baseline chest x-ray for surgical clearance. - Ordered pre-op  labs  - EKG tracing is personally reviewed.  EKG notes NSR.  No acute changes.  Surgical clearance pending review of lab work and CXR.       Relevant Orders   EKG 12-Lead (Completed)   DG Chest 2 View   Protime-INR   Urine Culture   PTT   Comp Met (CMET)   CBC w/Diff   OSA (obstructive sleep apnea)    CPAP discontinued post-weight loss, however pt did not have a follow up sleep study. Zolpidem  ineffective. Dissatisfied with current sleep specialist. - Referred to [facility] for evaluation by a new sleep specialist.      Relevant Orders   Ambulatory referral to Neurology   Morbid obesity (HCC)   Comorbidity:   Hyperlipidemia, hyperglycemia, OSA      Relevant Medications   phentermine  15 MG capsule   Insomnia   Finding zolpidem  ineffective. Advised her that phentermine  can contribute to insomnia- though she is taking in the AM which is good.       Depression   Reports that mood is much better on sertraline .       Addendum:  Pre-operative labs/CXR/EKG are reviewed. Pt is medically cleared to proceed with planned surgery. Assessment & Plan      I have discontinued Mersadez J. Gallogly's topiramate and Lomaira . I am also having her start on phentermine . Additionally, I am having her maintain her ferrous sulfate , b complex vitamins, Vitamin D , CALCIUM PO, Fish Oil , magnesium  oxide, tiZANidine , Magnesium , CALCIUM MAGNESIUM  ZINC PO, celecoxib , zolpidem , and sertraline .  Meds ordered this encounter  Medications   phentermine  15 MG capsule    Sig: Take 1 capsule (15 mg total) by mouth every morning.    Supervising Provider:   DOMENICA BLACKBIRD A [phone]

## 2024-11-12 NOTE — Assessment & Plan Note (Signed)
 Reports that mood is much better on sertraline .

## 2024-11-12 NOTE — Telephone Encounter (Signed)
 Received email that we are unable to proceed with surgery as scheduled at [facility] due to cost issue with implant. Patient has been moved to 11/26/2024 @ [facility] OR

## 2024-11-12 NOTE — Progress Notes (Signed)
 Subjective:     Patient ID: Beth [name], female    DOB: 1956/06/05, 68 y.o.   MRN: [id number]  Chief Complaint  Patient presents with   Pre-op Exam    Patient scheduled for right foot surgery 11/26/24    HPI  Discussed the use of AI scribe software for clinical note transcription with the patient, who gave verbal consent to proceed.  History of Present Illness Beth [name] is a 68 year old female who presents for pre-operative evaluation for triple arthrodesis surgery with tendo Achilles lengthening of R foot 11/26/24 to correct for the patient's chronic pain that she is experiencing.   She has a history of a ruptured arch tendon in 2008 or 2009, which has progressively worsened, leading to arthritis and increased flatness of her foot. This condition has caused her ankle to roll, resulting in pain and difficulty walking. Stretching provides temporary relief, but the duration of relief is decreasing.  She has consulted three podiatrists before finding one who identified the problem and referred her to a careers adviser. She is scheduled for a triple arthrodesis with tendo Achilles lengthening on the right side on the 19th.  She is currently taking sertraline  for mood, which she finds effective, especially since her husband is also on it. She was previously taking topiramate for weight loss but discontinued it due to ineffectiveness. She continues to take phentermine  15 mg daily, which she finds energizing. This is being prescribed by her weight loss specialist.  She has stopped using her CPAP machine after losing weight and reports that zolpidem , prescribed for sleep, is not working well anymore. She has not had a follow-up sleep study since discontinuing CPAP.  Patient presents for pre-op evaluation for triple arthrodesis surgery with tendo Achilles lengthening of R foot 11/26/24 to correct for the patient's chronic pain that she is experiencing.      Health Maintenance Due   Topic Date Due   Influenza Vaccine  07/09/2024   COVID-19 Vaccine (5 - 2025-26 season) 08/09/2024    Past Medical History:  Diagnosis Date   Anxiety    Arthritis    Depression    Sleep apnea    does not uses CPAP; uses mouth guard.     Past Surgical History:  Procedure Laterality Date   ANKLE SURGERY Right 2008   BACK SURGERY  2009   BUNIONECTOMY Right    COLONOSCOPY     COSMETIC SURGERY     JOINT REPLACEMENT  '13, '21   r knee, r shoulder   NISSEN FUNDOPLICATION     REPLACEMENT TOTAL KNEE Right 2012   SPINE SURGERY  '09   back   TONSILLECTOMY     TOTAL KNEE ARTHROPLASTY Left 03/09/2024   Procedure: ARTHROPLASTY, KNEE, TOTAL;  Surgeon: Vernetta Lonni GRADE, MD;  Location: [facility] OR;  Service: Orthopedics;  Laterality: Left;   TOTAL SHOULDER ARTHROPLASTY Right 03/28/2020   Procedure: RIGHT SHOULDER REPLACEMENT;  Surgeon: Addie Cordella Hamilton, MD;  Location: [facility] OR;  Service: Orthopedics;  Laterality: Right;    Family History  Problem Relation Age of Onset   Breast cancer Mother 47   Cancer Mother    Depression Mother    Diabetes Father    Hydrocephalus Father    Lupus Maternal Aunt    Tuberculosis Maternal Grandmother    Alcohol abuse Maternal Grandfather        suicide   Diabetes Mellitus II Paternal Grandmother    Peripheral vascular disease Paternal Grandmother  Colon cancer Neg Hx    Colon polyps Neg Hx    Esophageal cancer Neg Hx    Stomach cancer Neg Hx    Rectal cancer Neg Hx     Social History   Socioeconomic History   Marital status: Married    Spouse name: Beth [name]   Number of children: 2   Years of education: Not on file   Highest education level: Associate degree: occupational, scientist, product/process development, or vocational program  Occupational History   Occupation: retired  Tobacco Use   Smoking status: Former    Current packs/day: 0.00    Types: Cigarettes    Quit date: 07/22/1989    Years since quitting: 35.3   Smokeless tobacco: Never  Vaping Use   Vaping  status: Never Used  Substance and Sexual Activity   Alcohol use: Not Currently    Comment: social, couple times a month   Drug use: Never   Sexual activity: Yes    Partners: Male  Other Topics Concern   Not on file  Social History Narrative   Retired music therapist   Divorced (from [place]  originally)   Remarried 2015 (husband is a it trainer)   Has a dog   2 children (son and daughter) both married, daughter lives in [place], son in [place]. No grandchildren   Enjoys baking, sewing, reading   Social Drivers of Corporate Investment Banker Strain: Low Risk  (11/11/2024)   Overall Financial Resource Strain (CARDIA)    Difficulty of Paying Living Expenses: Not very hard  Food Insecurity: No Food Insecurity (11/11/2024)   Hunger Vital Sign    Worried About Running Out of Food in the Last Year: Never true    Ran Out of Food in the Last Year: Never true  Transportation Needs: No Transportation Needs (11/11/2024)   PRAPARE - Administrator, Civil Service (Medical): No    Lack of Transportation (Non-Medical): No  Physical Activity: Insufficiently Active (11/11/2024)   Exercise Vital Sign    Days of Exercise per Week: 3 days    Minutes of Exercise per Session: 30 min  Stress: No Stress Concern Present (11/11/2024)   [employer] of Occupational Health - Occupational Stress Questionnaire    Feeling of Stress: Only a little  Social Connections: Moderately Integrated (11/11/2024)   Social Connection and Isolation Panel    Frequency of Communication with Friends and Family: Twice a week    Frequency of Social Gatherings with Friends and Family: Twice a week    Attends Religious Services: More than 4 times per year    Active Member of [employer] or Organizations: No    Attends Engineer, Structural: Not on file    Marital Status: Married  Catering Manager Violence: Not At Risk (08/03/2024)   Humiliation, Afraid, Rape, and Kick questionnaire    Fear of Current or Ex-Partner: No     Emotionally Abused: No    Physically Abused: No    Sexually Abused: No    Outpatient Medications Prior to Visit  Medication Sig Dispense Refill   b complex vitamins tablet Take 1 tablet by mouth daily.     CALCIUM MAGNESIUM  ZINC PO Take by mouth.     CALCIUM PO Take 1 tablet by mouth daily.     celecoxib  (CELEBREX ) 200 MG capsule TAKE 1 CAPSULE BY MOUTH EVERY DAY 90 capsule 1   Cholecalciferol  (VITAMIN D ) 50 MCG (2000 UT) CAPS Take 2,000 Units by mouth daily.     ferrous sulfate   325 (65 FE) MG tablet Take 650 mg by mouth daily with breakfast.     Magnesium  250 MG CAPS Take by mouth.     magnesium  oxide (MAG-OX) 400 (240 Mg) MG tablet Take 400 mg by mouth daily.     Omega-3 Fatty Acids (FISH OIL ) 1000 MG CAPS Take 3 capsules (3,000 mg total) by mouth in the morning and at bedtime. (Patient taking differently: Take 3 capsules by mouth daily.)  0   sertraline  (ZOLOFT ) 100 MG tablet TAKE 1 TABLET BY MOUTH DAILY. NEEDS APPT 90 tablet 1   tiZANidine  (ZANAFLEX ) 2 MG tablet Take 1 tablet (2 mg total) by mouth every 6 (six) hours as needed for muscle spasms. 30 tablet 1   zolpidem  (AMBIEN ) 10 MG tablet Take 1 tablet (10 mg total) by mouth at bedtime. 30 tablet 2   Phentermine  HCl (LOMAIRA ) 8 MG TABS Take by mouth.     topiramate (TOPAMAX) 50 MG tablet Take 50 mg by mouth daily.     No facility-administered medications prior to visit.    No Known Allergies  ROS     Objective:    Physical Exam Constitutional:      General: She is not in acute distress.    Appearance: Normal appearance. She is well-developed.  HENT:     Head: Normocephalic and atraumatic.     Right Ear: Tympanic membrane, ear canal and external ear normal.     Left Ear: Tympanic membrane and external ear normal.  Eyes:     General: No scleral icterus. Neck:     Thyroid : No thyromegaly.  Cardiovascular:     Rate and Rhythm: Normal rate and regular rhythm.     Heart sounds: Normal heart sounds. No murmur  heard. Pulmonary:     Effort: Pulmonary effort is normal. No respiratory distress.     Breath sounds: Normal breath sounds. No wheezing.  Musculoskeletal:        General: No swelling.     Cervical back: Neck supple.  Lymphadenopathy:     Cervical: No cervical adenopathy.  Skin:    General: Skin is warm and dry.  Neurological:     General: No focal deficit present.     Mental Status: She is alert and oriented to person, place, and time.     Motor: No weakness.  Psychiatric:        Mood and Affect: Mood normal.        Behavior: Behavior normal.        Thought Content: Thought content normal.        Judgment: Judgment normal.      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 Surgical intervention planned. - Ordered baseline chest x-ray for surgical clearance. - Ordered pre-op  labs  - EKG tracing is personally reviewed.  EKG notes NSR.  No acute changes.  Surgical clearance pending review of lab work and CXR.       Relevant Orders   EKG 12-Lead (Completed)   DG Chest 2 View   Protime-INR   Urine Culture   PTT   Comp Met (CMET)   CBC w/Diff   OSA (obstructive sleep apnea)    CPAP discontinued post-weight loss, however pt did not have a follow up sleep study. Zolpidem  ineffective. Dissatisfied with current sleep specialist. - Referred to [facility] for evaluation by a new sleep specialist.      Relevant Orders   Ambulatory referral to Neurology   Morbid obesity (HCC)   Comorbidity:   Hyperlipidemia, hyperglycemia, OSA      Relevant Medications   phentermine  15 MG capsule   Insomnia   Finding zolpidem  ineffective. Advised her that phentermine  can contribute to insomnia- though she is taking in the AM which is good.       Depression   Reports that mood is much better on sertraline .       Addendum:  Pre-operative labs/CXR/EKG are reviewed. Pt is medically cleared to proceed with planned surgery. Assessment & Plan      I have discontinued Beth [name]'s topiramate and Lomaira . I am also having her start on phentermine . Additionally, I am having her maintain her ferrous sulfate , b complex vitamins, Vitamin D , CALCIUM PO, Fish Oil , magnesium  oxide, tiZANidine , Magnesium , CALCIUM MAGNESIUM  ZINC PO, celecoxib , zolpidem , and sertraline .  Meds ordered this encounter  Medications   phentermine  15 MG capsule    Sig: Take 1 capsule (15 mg total) by mouth every morning.    Supervising Provider:   DOMENICA BLACKBIRD A [phone]

## 2024-11-12 NOTE — Assessment & Plan Note (Signed)
  CPAP discontinued post-weight loss, however pt did not have a follow up sleep study. Zolpidem  ineffective. Dissatisfied with current sleep specialist. - Referred to [facility] for evaluation by a new sleep specialist.

## 2024-11-12 NOTE — Assessment & Plan Note (Signed)
 Comorbidity:  Hyperlipidemia, hyperglycemia, OSA

## 2024-11-13 LAB — URINE CULTURE
MICRO NUMBER:: 17318899
SPECIMEN QUALITY:: ADEQUATE

## 2024-11-14 NOTE — Telephone Encounter (Signed)
 Lab work looks good overall and chest x-ray is negative.  Urinalysis grew a small amount of bacteria which is probably contamination.  We don't need to treat unless she develops symptoms of uti,(burning/frequency etc.).  She is no longer anemic.  The protein in her blood is a bit low.  I would like for her to focus on increasing lean proteins in her diet like chicken, turkey, fish.    Also, it won't interfere with her surgical clearance, but I would like for her to return to the lab for a urinalysis (send out) to make sure she isn't losing protein through her urine.

## 2024-11-18 ENCOUNTER — Other Ambulatory Visit: Payer: Self-pay | Admitting: Family

## 2024-11-19 ENCOUNTER — Other Ambulatory Visit

## 2024-11-19 ENCOUNTER — Ambulatory Visit: Payer: Self-pay | Admitting: Family

## 2024-11-19 DIAGNOSIS — R809 Proteinuria, unspecified: Secondary | ICD-10-CM | POA: Diagnosis not present

## 2024-11-19 LAB — URINALYSIS, ROUTINE W REFLEX MICROSCOPIC
Bilirubin Urine: NEGATIVE
Hgb urine dipstick: NEGATIVE
Ketones, ur: NEGATIVE
Nitrite: NEGATIVE
Specific Gravity, Urine: 1.01 (ref 1.000–1.030)
Total Protein, Urine: NEGATIVE
Urine Glucose: NEGATIVE
Urobilinogen, UA: 0.2 (ref 0.0–1.0)
pH: 7 (ref 5.0–8.0)

## 2024-11-22 ENCOUNTER — Telehealth: Payer: Self-pay | Admitting: Podiatry

## 2024-11-22 NOTE — Telephone Encounter (Signed)
 DOS- 11/26/2024  TRIPLE ARTHRODESIS RT- 71284 TENDO-ACHILLES LENGTH RT- 72314  AETNA EFFECTIVE DATE- 12/09/2022  DEDUCTIBLE- $0 REMAINING- $0 OOP- $4150 REMAINING- $3328.33 COINSURANCE- 0%  PER AVAILITY PORTAL, PRIOR AUTH IS NOT REQUIRED FOR CPT CODES 71284 AND [phone]. DOCUMENTATION ATTACHED TO SURGERY CONSENT PACKET.

## 2024-11-24 ENCOUNTER — Ambulatory Visit: Admitting: Podiatry

## 2024-11-24 ENCOUNTER — Encounter

## 2024-11-25 ENCOUNTER — Encounter (HOSPITAL_COMMUNITY): Payer: Self-pay | Admitting: Podiatry

## 2024-11-25 ENCOUNTER — Other Ambulatory Visit: Payer: Self-pay

## 2024-11-25 NOTE — Progress Notes (Signed)
 SDW call  Patient was given pre-op instructions over the phone. Patient verbalized understanding of instructions provided. Denies any SOB, fever or cough     PCP - Eleanor Ponto, NP, clearance 11/12/2024 Cardiologist -  Pulmonary:    PPM/ICD - denies Device Orders - na Rep Notified - na   Chest x-ray - 11/12/2024 EKG -  11/12/2024 Stress Test - ECHO -  Cardiac Cath -   Sleep Study/sleep apnea/CPAP: OSA, no longer wears her CPAP or mouth guard  Non-diabetic  Blood Thinner Instructions: denies Aspirin  Instructions:denies   ERAS Protcol - Clears until 1215   Anesthesia review: No  Your procedure is scheduled on Friday November 26, 2024  Report to [facility] Main Entrance A at  1245 PM., then check in with the Admitting office.  Call this number if you have problems the morning of surgery:  [phone]   If you have any questions prior to your surgery date call [phone]: Open Monday-Friday 8am-4pm If you experience any cold or flu symptoms such as cough, fever, chills, shortness of breath, etc. between now and your scheduled surgery, please notify us  at the above number     Remember:  Do not eat after midnight the night before your surgery  You may drink clear liquids until  1215 the day of your surgery.   Clear liquids allowed are: Water , Non-Citrus Juices (without pulp), Carbonated Beverages, Clear Tea, Black Coffee ONLY (NO MILK, CREAM OR POWDERED CREAMER of any kind), and Gatorade   Take these medicines the morning of surgery with A SIP OF WATER :  Zoloft   As needed:   As of today, STOP taking any Aspirin  (unless otherwise instructed by your surgeon) Aleve, Naproxen, Ibuprofen, Motrin, Advil, Goody's, BC's, all herbal medications, fish oil , and all vitamins.  This includes your celebrex  and phentermine 

## 2024-11-26 ENCOUNTER — Ambulatory Visit (HOSPITAL_COMMUNITY): Payer: Self-pay

## 2024-11-26 ENCOUNTER — Ambulatory Visit (HOSPITAL_COMMUNITY)

## 2024-11-26 ENCOUNTER — Ambulatory Visit (HOSPITAL_COMMUNITY): Admission: RE | Admit: 2024-11-26 | Admitting: Podiatry

## 2024-11-26 ENCOUNTER — Encounter: Admission: RE | Disposition: A | Payer: Self-pay | Attending: Podiatry

## 2024-11-26 ENCOUNTER — Other Ambulatory Visit: Payer: Self-pay

## 2024-11-26 ENCOUNTER — Encounter (HOSPITAL_COMMUNITY): Payer: Self-pay | Admitting: Podiatry

## 2024-11-26 DIAGNOSIS — M19071 Primary osteoarthritis, right ankle and foot: Secondary | ICD-10-CM | POA: Insufficient documentation

## 2024-11-26 DIAGNOSIS — M2140 Flat foot [pes planus] (acquired), unspecified foot: Secondary | ICD-10-CM | POA: Diagnosis not present

## 2024-11-26 DIAGNOSIS — Z87891 Personal history of nicotine dependence: Secondary | ICD-10-CM | POA: Insufficient documentation

## 2024-11-26 DIAGNOSIS — M2141 Flat foot [pes planus] (acquired), right foot: Secondary | ICD-10-CM

## 2024-11-26 DIAGNOSIS — F419 Anxiety disorder, unspecified: Secondary | ICD-10-CM | POA: Diagnosis not present

## 2024-11-26 DIAGNOSIS — G8929 Other chronic pain: Secondary | ICD-10-CM | POA: Insufficient documentation

## 2024-11-26 DIAGNOSIS — M62461 Contracture of muscle, right lower leg: Secondary | ICD-10-CM | POA: Diagnosis not present

## 2024-11-26 DIAGNOSIS — F32A Depression, unspecified: Secondary | ICD-10-CM | POA: Diagnosis not present

## 2024-11-26 HISTORY — PX: ACHILLES TENDON LENGTHENING: SHX6455

## 2024-11-26 HISTORY — PX: FOOT ARTHRODESIS: SHX1655

## 2024-11-26 SURGERY — FUSION, JOINT, FOOT
Anesthesia: General | Site: Foot | Laterality: Right

## 2024-11-26 MED ORDER — ACETAMINOPHEN 500 MG PO TABS
1000.0000 mg | ORAL_TABLET | Freq: Once | ORAL | Status: AC
  Administered 2024-11-26: 1000 mg via ORAL
  Filled 2024-11-26: qty 2

## 2024-11-26 MED ORDER — MIDAZOLAM HCL 2 MG/2ML IJ SOLN
INTRAMUSCULAR | Status: AC
  Filled 2024-11-26: qty 2

## 2024-11-26 MED ORDER — FENTANYL CITRATE (PF) 100 MCG/2ML IJ SOLN
INTRAMUSCULAR | Status: AC
  Filled 2024-11-26: qty 2

## 2024-11-26 MED ORDER — PHENYLEPHRINE HCL-NACL 20-0.9 MG/250ML-% IV SOLN
INTRAVENOUS | Status: DC | PRN
  Administered 2024-11-26: 20 ug/min via INTRAVENOUS

## 2024-11-26 MED ORDER — LIDOCAINE 2% (20 MG/ML) 5 ML SYRINGE
INTRAMUSCULAR | Status: DC | PRN
  Administered 2024-11-26: 100 mg via INTRAVENOUS

## 2024-11-26 MED ORDER — DROPERIDOL 2.5 MG/ML IJ SOLN: 0.6250 mg | Freq: Once | INTRAMUSCULAR | Status: DC | PRN

## 2024-11-26 MED ORDER — OXYCODONE HCL 5 MG/5ML PO SOLN: 5.0000 mg | Freq: Once | ORAL | Status: AC | PRN

## 2024-11-26 MED ORDER — BUPIVACAINE HCL (PF) 0.25 % IJ SOLN
INTRAMUSCULAR | Status: DC | PRN
  Administered 2024-11-26: 20 mL via PERINEURAL

## 2024-11-26 MED ORDER — ONDANSETRON HCL 4 MG/2ML IJ SOLN
INTRAMUSCULAR | Status: DC | PRN
  Administered 2024-11-26: 4 mg via INTRAVENOUS

## 2024-11-26 MED ORDER — KETOROLAC TROMETHAMINE 30 MG/ML IJ SOLN
INTRAMUSCULAR | Status: AC
  Filled 2024-11-26: qty 1

## 2024-11-26 MED ORDER — OXYCODONE HCL 5 MG PO TABS
5.0000 mg | ORAL_TABLET | Freq: Once | ORAL | Status: AC | PRN
  Administered 2024-11-26: 5 mg via ORAL

## 2024-11-26 MED ORDER — OXYCODONE HCL 5 MG PO TABS
ORAL_TABLET | ORAL | Status: AC
  Filled 2024-11-26: qty 1

## 2024-11-26 MED ORDER — CHLORHEXIDINE GLUCONATE 0.12 % MT SOLN
15.0000 mL | Freq: Once | OROMUCOSAL | Status: AC
  Administered 2024-11-26: 15 mL via OROMUCOSAL
  Filled 2024-11-26: qty 15

## 2024-11-26 MED ORDER — PROPOFOL 10 MG/ML IV BOLUS
INTRAVENOUS | Status: DC | PRN
  Administered 2024-11-26: 180 mg via INTRAVENOUS

## 2024-11-26 MED ORDER — OXYCODONE-ACETAMINOPHEN 10-325 MG PO TABS: 1.0000 | ORAL_TABLET | Freq: Four times a day (QID) | ORAL | 0 refills | Status: DC | PRN

## 2024-11-26 MED ORDER — PROPOFOL 10 MG/ML IV BOLUS
INTRAVENOUS | Status: AC
  Filled 2024-11-26: qty 20

## 2024-11-26 MED ORDER — CEFAZOLIN SODIUM-DEXTROSE 2-4 GM/100ML-% IV SOLN
2.0000 g | INTRAVENOUS | Status: AC
  Administered 2024-11-26: 2 g via INTRAVENOUS
  Filled 2024-11-26: qty 100

## 2024-11-26 MED ORDER — LACTATED RINGERS IV SOLN: INTRAVENOUS | Status: DC

## 2024-11-26 MED ORDER — BUPIVACAINE LIPOSOME 1.3 % IJ SUSP
INTRAMUSCULAR | Status: DC | PRN
  Administered 2024-11-26: 10 mL via PERINEURAL

## 2024-11-26 MED ORDER — DEXAMETHASONE SOD PHOSPHATE PF 10 MG/ML IJ SOLN
INTRAMUSCULAR | Status: DC | PRN
  Administered 2024-11-26: 10 mg via INTRAVENOUS

## 2024-11-26 MED ORDER — FENTANYL CITRATE (PF) 250 MCG/5ML IJ SOLN
INTRAMUSCULAR | Status: DC | PRN
  Administered 2024-11-26 (×5): 50 ug via INTRAVENOUS

## 2024-11-26 MED ORDER — FENTANYL CITRATE (PF) 100 MCG/2ML IJ SOLN
25.0000 ug | INTRAMUSCULAR | Status: DC | PRN
  Administered 2024-11-26 (×2): 50 ug via INTRAVENOUS

## 2024-11-26 MED ORDER — ORAL CARE MOUTH RINSE: 15.0000 mL | Freq: Once | OROMUCOSAL | Status: AC

## 2024-11-26 MED ORDER — MIDAZOLAM HCL (PF) 2 MG/2ML IJ SOLN: 2.0000 mg | Freq: Once | INTRAMUSCULAR | Status: AC

## 2024-11-26 MED ORDER — ONDANSETRON HCL 4 MG/2ML IJ SOLN
INTRAMUSCULAR | Status: AC
  Filled 2024-11-26: qty 2

## 2024-11-26 MED ORDER — BUPIVACAINE HCL (PF) 0.5 % IJ SOLN
INTRAMUSCULAR | Status: DC | PRN
  Administered 2024-11-26: 10 mL via PERINEURAL

## 2024-11-26 MED ORDER — LIDOCAINE 2% (20 MG/ML) 5 ML SYRINGE
INTRAMUSCULAR | Status: AC
  Filled 2024-11-26: qty 5

## 2024-11-26 MED ORDER — 0.9 % SODIUM CHLORIDE (POUR BTL) OPTIME
TOPICAL | Status: DC | PRN
  Administered 2024-11-26: 1000 mL

## 2024-11-26 MED ORDER — FENTANYL CITRATE (PF) 250 MCG/5ML IJ SOLN
INTRAMUSCULAR | Status: AC
  Filled 2024-11-26: qty 5

## 2024-11-26 MED ORDER — MIDAZOLAM HCL 2 MG/2ML IJ SOLN
INTRAMUSCULAR | Status: AC
  Administered 2024-11-26: 2 mg via INTRAVENOUS
  Filled 2024-11-26: qty 2

## 2024-11-26 SURGICAL SUPPLY — 60 items
BAG COUNTER SPONGE SURGICOUNT (BAG) ×2 IMPLANT
BIT DRILL 2 FENESTRATED (MISCELLANEOUS) IMPLANT
BIT DRILL CANNULATED 4.6 (BIT) IMPLANT
BIT DRILL CNTRSNK MONSTER 7.0 (DRILL) IMPLANT
BLADE LONG MED 31X9 (MISCELLANEOUS) ×2 IMPLANT
BLADE SURG 15 STRL LF DISP TIS (BLADE) ×6 IMPLANT
BLADE SW THK.38XMED NAR THN (BLADE) ×2 IMPLANT
BNDG COMPR ESMARK 4X3 LF (GAUZE/BANDAGES/DRESSINGS) ×2 IMPLANT
BNDG ELASTIC 4INX 5YD STR LF (GAUZE/BANDAGES/DRESSINGS) IMPLANT
BNDG ELASTIC 4X5.8 VLCR STR LF (GAUZE/BANDAGES/DRESSINGS) ×4 IMPLANT
BNDG GAUZE DERMACEA FLUFF 4 (GAUZE/BANDAGES/DRESSINGS) ×4 IMPLANT
CHLORAPREP W/TINT 26 (MISCELLANEOUS) ×2 IMPLANT
CLSR STERI-STRIP ANTIMIC 1/2X4 (GAUZE/BANDAGES/DRESSINGS) ×4 IMPLANT
COVER SURGICAL LIGHT HANDLE (MISCELLANEOUS) ×2 IMPLANT
CUFF TOURN SGL QUICK 18X4 (TOURNIQUET CUFF) IMPLANT
CUFF TRNQT CYL 24X4X16.5-23 (TOURNIQUET CUFF) IMPLANT
CUFF TRNQT CYL 34X4.125X (TOURNIQUET CUFF) ×2 IMPLANT
DRAPE EXTREMITY T 121X128X90 (DISPOSABLE) ×2 IMPLANT
DRAPE OEC MINIVIEW 54X84 (DRAPES) ×2 IMPLANT
DRAPE U-SHAPE 47X51 STRL (DRAPES) ×2 IMPLANT
DRSG XEROFORM 1X8 (GAUZE/BANDAGES/DRESSINGS) IMPLANT
ELECTRODE REM PT RTRN 9FT ADLT (ELECTROSURGICAL) ×2 IMPLANT
GAUZE PAD ABD 8X10 STRL (GAUZE/BANDAGES/DRESSINGS) ×2 IMPLANT
GAUZE SPONGE 4X4 12PLY STRL (GAUZE/BANDAGES/DRESSINGS) ×2 IMPLANT
GAUZE XEROFORM 1X8 LF (GAUZE/BANDAGES/DRESSINGS) ×2 IMPLANT
GLOVE BIO SURGEON STRL SZ8 (GLOVE) ×2 IMPLANT
GLOVE BIOGEL PI IND STRL 8 (GLOVE) ×2 IMPLANT
GOWN STRL REUS W/ TWL LRG LVL3 (GOWN DISPOSABLE) ×4 IMPLANT
GOWN STRL REUS W/ TWL XL LVL3 (GOWN DISPOSABLE) ×2 IMPLANT
KIT BASIN OR (CUSTOM PROCEDURE TRAY) ×2 IMPLANT
KIT TURNOVER KIT B (KITS) ×2 IMPLANT
KWIRE SINGLE TROCAR 2.3X230 (WIRE) IMPLANT
KWIRE SMOOTH 1.6X150MM (WIRE) IMPLANT
NDL 18GX1X1/2 (RX/OR ONLY) (NEEDLE) IMPLANT
NDL HYPO 25GX1X1/2 BEV (NEEDLE) ×2 IMPLANT
NDL HYPO 25X1 1.5 SAFETY (NEEDLE) ×2 IMPLANT
NEEDLE 18GX1X1/2 (RX/OR ONLY) (NEEDLE) IMPLANT
NEEDLE HYPO 25GX1X1/2 BEV (NEEDLE) ×2 IMPLANT
NEEDLE HYPO 25X1 1.5 SAFETY (NEEDLE) ×2 IMPLANT
PACK ORTHO EXTREMITY (CUSTOM PROCEDURE TRAY) ×2 IMPLANT
PAD ARMBOARD POSITIONER FOAM (MISCELLANEOUS) ×4 IMPLANT
PAD CAST 4YDX4 CTTN HI CHSV (CAST SUPPLIES) ×2 IMPLANT
PADDING CAST ABS COTTON 4X4 ST (CAST SUPPLIES) ×4 IMPLANT
PUTTY DBX 5CC (Putty) IMPLANT
SCREW CANN ST 7.0X76 (Screw) IMPLANT
SOL PREP POV-IOD 4OZ 10% (MISCELLANEOUS) ×4 IMPLANT
SOLN 0.9% NACL POUR BTL 1000ML (IV SOLUTION) ×2 IMPLANT
SPLINT FIBERGLASS 4X30 (CAST SUPPLIES) ×2 IMPLANT
STAPLE STRT JAW GW 25X20X20 (Staple) IMPLANT
STAPLER VISISTAT (STAPLE) IMPLANT
SUT MON AB 5-0 PS2 18 (SUTURE) ×2 IMPLANT
SUT PROLENE 3 0 PS 1 (SUTURE) IMPLANT
SUT PROLENE 4 0 PS 2 18 (SUTURE) ×2 IMPLANT
SUT VIC AB 3-0 PS1 18X BRD (SUTURE) IMPLANT
SUT VIC AB 4-0 PS2 18 (SUTURE) ×4 IMPLANT
SYR BULB EAR ULCER 3OZ GRN STR (SYRINGE) ×2 IMPLANT
SYR CONTROL 10ML LL (SYRINGE) ×2 IMPLANT
TOWEL GREEN STERILE (TOWEL DISPOSABLE) ×2 IMPLANT
TOWEL GREEN STERILE FF (TOWEL DISPOSABLE) ×2 IMPLANT
UNDERPAD 30X36 HEAVY ABSORB (UNDERPADS AND DIAPERS) ×2 IMPLANT

## 2024-11-26 NOTE — Progress Notes (Signed)
 Orthopedic Tech Progress Note Patient Details:  Beth [name] 1956/11/30 [id number]  Ortho Devices Type of Ortho Device: CAM walker, Crutches Ortho Device/Splint Location: RLE/patient stated she knew how to use crutches Ortho Device/Splint Interventions: Ordered, Application, Adjustment   Post Interventions Patient Tolerated: Well Instructions Provided: Care of device, Adjustment of device  Adine MARLA [name] 11/26/2024, 6:27 PM

## 2024-11-26 NOTE — Anesthesia Procedure Notes (Signed)
 Procedure Name: LMA Insertion Date/Time: 11/26/2024 3:08 PM  Performed by: Roslynn Waddell LABOR, CRNAPre-anesthesia Checklist: Patient identified, Emergency Drugs available, Suction available and Patient being monitored Patient Re-evaluated:Patient Re-evaluated prior to induction Oxygen Delivery Method: Circle System Utilized Preoxygenation: Pre-oxygenation with 100% oxygen Induction Type: IV induction Ventilation: Mask ventilation without difficulty LMA: LMA inserted LMA Size: 4.0 Number of attempts: 1 Airway Equipment and Method: Bite block Placement Confirmation: positive ETCO2 Tube secured with: Tape Dental Injury: Teeth and Oropharynx as per pre-operative assessment  Comments: Atraumatic induction/LMA insertion. Dentition and oral mucosa as per preop.

## 2024-11-26 NOTE — Op Note (Signed)
 "  OPERATIVE REPORT Patient name: Beth [name] MRN: [id number] DOB: 08-08-1956  DOS: 11/26/2024  Preop Dx: Pes planus w/ DJD right foot. Equinus right.  Postop Dx: same  Procedure:  1. Tendo achilles lengthening right 2. Triple arthrodesis right  Surgeon: Thresa EMERSON Sar DPM  Anesthesia: Popliteal block administered by anesthesia preoperatively  Hemostasis: Thigh tourniquet inflated to a pressure of 300 mmHg after esmarch exsanguination   EBL: 20 mL Materials: Paragon28 Subtalar screw x 1. Paragon28 Compression Staple x 3. DBX bone putty. Injectables: None Pathology: None  Condition: The patient tolerated the procedure and anesthesia well. No complications noted or reported   Justification for procedure: The patient is a 68 y.o. 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 Conservative modalities of been unsuccessful in providing any sort of satisfactory alleviation of symptoms with the patient. The patient was told benefits as well as possible side effects of the surgery. The patient consented for surgical correction. The patient consent form was reviewed. All patient questions were answered. No guarantees were expressed or implied. The patient and the surgeon both signed the patient consent form with the witness present and placed in the patient's chart.   Procedure in Detail: The patient was brought to the operating room, placed in the operating table in the supine position at which time an aseptic scrub and drape were performed about the patient's respective lower extremity after anesthesia was induced as described above. Attention was then directed to the surgical area where procedure number one commenced.  Procedure #1: Tendo Achilles lengthening right Attention was first directed to the posterior aspect of the right ankle where 3 small percutaneous incisions were planned and made using a #11 scalpel spaced approximately  1.5 cm apart along the Achilles tendon.  The most distal incision began approximately 4 cm proximal to the insertion.  The most distal and proximal incision began bisecting the Achilles tendon and transversely extended medially with the central incision extended laterally in essence creating an accordion type lengthening of the Achilles tendon.  Increased ankle joint dorsiflexion was noted.  4-0 Prolene suture was utilized to reapproximate the percutaneous skin incisions.  Procedure #2: Triple arthrodesis right A 5 cm curvilinear longitudinal skin incision was planned and made about the lateral aspect of the patient's right ankle.  The incision began just distal to the lateral malleolus was extended overlying subtalar and calcaneocuboid joint.  The incision was carried down to the level of joint capsule with care taken to cut clamped ligated or tracked well small neurovascular structures and muscle bellies visualized within the incision site.  At this time a capsulotomy was performed at the subtalar joint.  The cervical ligament and intraosseous ligament within the subtalar joint were resected sharply using a surgical #15 blade.  After appropriate joint exposure of the subtalar joint a lamina spreader was inserted between the neck of the talus and calcaneus and distraction was achieved to allow for better visualization and preparation of the joint. Preparation of the joint was achieved using a combination of curettage, rotary bur, and sagittal blade.  After all cartilaginous surfaces were resected away subchondral drilling was achieved using a 0.062 inch K wire   To optimize arthrodesis of the subtalar joint.  After the subtalar joint was properly and appropriately prepared for arthrodesis attention was then directed within the same incision site to the calcaneocuboid joint. A joint capsulotomy was performed calcaneocuboid joint using the surgical #15 blade and distraction  was again utilized with a Hintermann  distractor. Preparation of the calcaneocuboid joint was achieved in the same manner as previously mentioned to the prior joints. At this time copious irrigation was utilized to all incision sites and arthrodesis sites in preparation for primary arthrodesis. All distraction instrumentation was removed.  Attention was then directed to the medial aspect of the patient's right foot where another 5 cm curvilinear longitudinal skin incision was planned and made beginning at the distal tip of the medial malleolus and extending curvilinear overlying the talonavicular joint.   The incision was carried down to the level of the joint with care taken to Clamp ligated or retracted away all small neurovascular structures traversing the incision site.  A joint capsulotomy was performed using surgical #15 blade to expose the underlying cartilaginous surfaces of the talus and navicular.  To allow for better distraction and visualization of the joint, a Hintermann joint distractor was utilized.  Preparation of the talonavicular joint was achieved in the exact same manner as the subtalar joint using combination of curettage, rotary bur, and sagittal blade.  Subchondral drilling was achieved in the same manner as well.  Headless cannulated lag screw was utilized to primarily fuse and stabilize the subtalar joint. The screw was inserted through the posterior Calcaneal tubercle and driven through the subtalar joint into the body of the talus while the subtalar joint was held rectus. Intraoperative x-ray fluoroscopy was utilized to visualize placement of the orthopedic screw which was satisfactory. At this time attention was directed to the talonavicular joint where 2 seperate compression staples were utilized for stabilization and primary fusion of the talonavicular joint. Again x-ray fluoroscopy was utilized to visualize the placement of the orthopedic compression staples and reduction of the joint which was satisfactory. Primary  fusion and stabilization of the calcaneocuboid joint was also achieved in the same manner as the talonavicular joint using a single compression staple.  Approximately 1 mL of DBX bone putty was inserted within the osseous void of the CC joint to assist with fusion across the lateral column.  Again intraoperative x-ray fluoroscopy was utilized and visualization was satisfactory. All orthopedic hardware was inserted in the standard AO fashion. Final intraoperative x-ray fluoroscopy was utilized prior to routine layered soft tissue closure.  Copious irrigation of all incision sites was again performed and the incision sites were dried in preparation for routine layered soft tissue closure. 3-0 Vicryl suture was utilized to reapproximate deep subcutaneous tissue followed by 4-0 Vicryl suture to reapproximate superficial subcuticular tissue followed by stainless still skin staples to reapproximate superficial skin edges.   Dry sterile compressive dressings were then applied to all previously mentioned incision sites about the patient's lower extremity. The thigh tourniquet which was used for hemostasis was deflated. All normal neurovascular responses including pink color and warmth returned all the digits of the patient's lower extremity.  Order placed for immobilization cam boot to be applied by the Orthopedic Technician. The patient was then transferred from the operating room to the recovery room having tolerated the procedure and anesthesia well. All vital signs are stable. After a brief stay in the recovery room the patient was discharged to her home with post care instructions provided.  Keep dressings clean dry and intact until they're to follow-up with surgeon Dr. Thresa Sar in the office next week. Strict nonweightbearing in the immobilization cam boot.   Thresa EMERSON Sar, DPM [facility]  Dr. Thresa EMERSON Sar, DPM    [address] [place], [place]  [address]                Office [phone]  Fax [phone]   "

## 2024-11-26 NOTE — Anesthesia Preprocedure Evaluation (Signed)
"                                    Anesthesia Evaluation  Patient identified by MRN, date of birth, ID band Patient awake    Reviewed: Allergy & Precautions, NPO status , Patient's Chart, lab work & pertinent test results  History of Anesthesia Complications Negative for: history of anesthetic complications  Airway Mallampati: III  TM Distance: >3 FB Neck ROM: Full    Dental no notable dental hx. (+) Teeth Intact   Pulmonary neg pulmonary ROS, sleep apnea , neg COPD, Patient abstained from smoking.Not current smoker, former smoker   Pulmonary exam normal breath sounds clear to auscultation       Cardiovascular Exercise Tolerance: Good METS(-) hypertension(-) CAD and (-) Past MI negative cardio ROS (-) dysrhythmias  Rhythm:Regular Rate:Normal - Systolic murmurs    Neuro/Psych  PSYCHIATRIC DISORDERS Anxiety Depression    negative neurological ROS     GI/Hepatic ,neg GERD  ,,(+)     (-) substance abuse    Endo/Other  neg diabetes    Renal/GU negative Renal ROS     Musculoskeletal  (+) Arthritis ,    Abdominal  (+) + obese  Peds  Hematology   Anesthesia Other Findings Past Medical History: No date: Anxiety No date: Arthritis No date: Depression No date: Sleep apnea     Comment:  No CPAP or mouth guard  Reproductive/Obstetrics                              Anesthesia Physical Anesthesia Plan  ASA: 2  Anesthesia Plan: General   Post-op Pain Management: Regional block*, Tylenol  PO (pre-op)* and Toradol  IV (intra-op)*   Induction: Intravenous  PONV Risk Score and Plan: 3 and Ondansetron , Dexamethasone  and Midazolam   Airway Management Planned: Oral ETT  Additional Equipment: None  Intra-op Plan:   Post-operative Plan: Extubation in OR  Informed Consent: I have reviewed the patients History and Physical, chart, labs and discussed the procedure including the risks, benefits and alternatives for the proposed  anesthesia with the patient or authorized representative who has indicated his/her understanding and acceptance.     Dental advisory given  Plan Discussed with: CRNA and Surgeon  Anesthesia Plan Comments: (Discussed risks of anesthesia with patient, including PONV, sore throat, lip/dental/eye damage. Rare risks discussed as well, such as cardiorespiratory and neurological sequelae, and allergic reactions. Discussed the role of CRNA in patient's perioperative care. Patient understands.  Discussed r/b/a of adductor canal and popliteal nerve block, including:  - bleeding, infection, nerve damage - poor or non functioning block. - reactions and toxicity to local anesthetic Patient understands. Plan to use exparel  in popliteal block per surgeon request. )         Anesthesia Quick Evaluation  "

## 2024-11-26 NOTE — Brief Op Note (Signed)
 11/26/2024  6:05 PM  PATIENT:  [name]  68 y.o. 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 equinus, right [id number]  POST-OPERATIVE DIAGNOSIS:  Pes planovalgus, acquired, right M2141Gastrocnemius equinus, right F37538  PROCEDURE:  Procedures: FUSION, JOINT, FOOT (Right) LENGTHENING, TENDON, ACHILLES (Right)  SURGEON:  Surgeons and Role:    DEWAINE Janit Thresa CHRISTELLA, DPM - Primary  PHYSICIAN ASSISTANT: None  ASSISTANTS: none   ANESTHESIA:   regional and general  EBL:  50mL   BLOOD ADMINISTERED:none  DRAINS: none   LOCAL MEDICATIONS USED:  NONE  SPECIMEN:  No Specimen  DISPOSITION OF SPECIMEN:  N/A  COUNTS:  YES  TOURNIQUET:   Total Tourniquet Time Documented: Thigh (Right) - 121 minutes Total: Thigh (Right) - 121 minutes   DICTATION: .Nechama Dictation  PLAN OF CARE: Discharge to home after PACU  PATIENT DISPOSITION:  PACU - hemodynamically stable.   Delay start of Pharmacological VTE agent (>24hrs) due to surgical blood loss or risk of bleeding: no  Thresa EMERSON Janit, DPM [facility]  Dr. Thresa EMERSON Janit, DPM    [address] [place], [place] [address]                Office [phone]  Fax [phone]

## 2024-11-26 NOTE — Transfer of Care (Signed)
 Immediate Anesthesia Transfer of Care Note  Patient: Beth [name]  Procedure(s) Performed: FUSION, JOINT, FOOT (Right: Foot) LENGTHENING, TENDON, ACHILLES (Right)  Patient Location: PACU  Anesthesia Type:General  Level of Consciousness: awake, alert , and oriented  Airway & Oxygen Therapy: Patient Spontanous Breathing and Patient connected to face mask oxygen  Post-op Assessment: Report given to RN and Post -op Vital signs reviewed and stable  Post vital signs: Reviewed and stable  Last Vitals:  Vitals Value Taken Time  BP 150/86 11/26/24 18:04  Temp 36.5 C 11/26/24 18:04  Pulse 76 11/26/24 18:12  Resp 16 11/26/24 18:12  SpO2 96 % 11/26/24 18:12  Vitals shown include unfiled device data.  Last Pain:  Vitals:   11/26/24 1804  TempSrc:   PainSc: 3          Complications: No notable events documented.

## 2024-11-26 NOTE — Discharge Instructions (Addendum)
 Leave dressings clean and dry. Leave CAM boot intact. Elevate lower extremity. Ice behind knee 66min/hour for the first 48 hours. Continue ice 4-5 times per day. Strict non-weight bearing to lower extremity.

## 2024-11-26 NOTE — Interval H&P Note (Signed)
 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  The various methods of treatment have been discussed with the patient and family. After consideration of risks, benefits and other options for treatment, the patient has consented to  Procedures: FUSION, JOINT, FOOT (Right) LENGTHENING, TENDON, ACHILLES (Right) as a surgical intervention.  The patient's history has been reviewed, patient examined, no change in status, stable for surgery.  I have reviewed the patient's chart and labs.  Questions were answered to the patient's satisfaction.     Beth [name]

## 2024-11-26 NOTE — Anesthesia Procedure Notes (Signed)
 Anesthesia Regional Block: Adductor canal block   Pre-Anesthetic Checklist: , timeout performed,  Correct Patient, Correct Site, Correct Laterality,  Correct Procedure, Correct Position, site marked,  Risks and benefits discussed,  Surgical consent,  Pre-op evaluation,  At surgeon's request and post-op pain management  Laterality: Lower and Right  Prep: chloraprep       Needles:  Injection technique: Single-shot  Needle Type: Echogenic Needle     Needle Length: 9cm  Needle Gauge: 21     Additional Needles:   Procedures:,,,, ultrasound used (permanent image in chart),,    Narrative:  Start time: 11/26/2024 2:28 PM End time: 11/26/2024 2:32 PM Injection made incrementally with aspirations every 5 mL.  Performed by: Personally  Anesthesiologist: Boone Fess, MD  Additional Notes: Patient's chart reviewed and they were deemed appropriate candidate for procedure, per surgeon's request. Patient educated about risks, benefits, and alternatives of the block including but not limited to: temporary or permanent nerve damage, bleeding, infection, damage to surround tissues, block failure, local anesthetic toxicity. Patient expressed understanding. A formal time-out was conducted consistent with institution rules.  Monitors were applied, and minimal sedation used (see nursing record). The site was prepped with skin prep and allowed to dry, and sterile gloves were used. A high frequency linear ultrasound probe with probe cover was utilized throughout. Femoral artery visualized at mid-thigh level, local anesthetic injected anterolateral to it, and echogenic block needle trajectory was monitored throughout. Hydrodissection of saphenous nerve visualized and appeared anatomically normal. Aspiration performed every 5ml. Blood vessels were avoided. All injections were performed without resistance and free of blood and paresthesias. The patient tolerated the procedure well.  Injectate: 20ml 0.25%  bupivacaine 

## 2024-11-26 NOTE — Anesthesia Procedure Notes (Signed)
 Anesthesia Regional Block: Popliteal block   Pre-Anesthetic Checklist: , timeout performed,  Correct Patient, Correct Site, Correct Laterality,  Correct Procedure, Correct Position, site marked,  Risks and benefits discussed,  Surgical consent,  Pre-op evaluation,  At surgeon's request and post-op pain management  Laterality: Lower and Right  Prep: chloraprep       Needles:  Injection technique: Single-shot  Needle Type: Echogenic Needle     Needle Length: 9cm  Needle Gauge: 21     Additional Needles:   Procedures:,,,, ultrasound used (permanent image in chart),,    Narrative:  Start time: 11/26/2024 2:26 PM End time: 11/26/2024 2:28 PM Injection made incrementally with aspirations every 5 mL.  Performed by: Personally  Anesthesiologist: Boone Fess, MD  Additional Notes: Patient's chart reviewed and they were deemed appropriate candidate for procedure, at surgeon's request. Patient educated about risks, benefits, and alternatives of the block including but not limited to: temporary or permanent nerve damage, bleeding, infection, damage to surround tissues, block failure, local anesthetic toxicity. Patient expressed understanding. A formal time-out was conducted consistent with institution rules.  Monitors were applied, and minimal sedation used. The site was prepped with skin prep and allowed to dry, and sterile gloves were used. A high frequency linear ultrasound probe with probe cover was utilized throughout. Popliteal artery pulsatile and visualized in popliteal fossa along with adjacent sciatic nerve and its branch point, which appeared anatomically normal, local anesthetic injected around them just proximal to the branch point, and echogenic block needle trajectory was monitored throughout. Aspiration performed every 5ml. Blood vessels were avoided. All injections were performed without resistance and free of blood and paresthesias. The patient tolerated the procedure  well.  Injectate: 10ml 0.5% bupivacaine  + 10ml exparel 

## 2024-11-27 NOTE — Anesthesia Postprocedure Evaluation (Signed)
"   Anesthesia Post Note  Patient: Beth [name]  Procedure(s) Performed: FUSION, JOINT, FOOT (Right: Foot) LENGTHENING, TENDON, ACHILLES (Right)     Patient location during evaluation: PACU Anesthesia Type: General and Regional Level of consciousness: awake and alert, oriented and patient cooperative Pain management: pain level controlled Vital Signs Assessment: post-procedure vital signs reviewed and stable Respiratory status: spontaneous breathing, nonlabored ventilation and respiratory function stable Cardiovascular status: blood pressure returned to baseline and stable Postop Assessment: no apparent nausea or vomiting Anesthetic complications: no   No notable events documented.  Last Vitals:  Vitals:   11/26/24 1845 11/26/24 1900  BP: (!) 155/79 (!) 153/75  Pulse: 66 70  Resp: 11 13  Temp:  36.7 C  SpO2: 95% 95%    Last Pain:  Vitals:   11/26/24 1804  TempSrc:   PainSc: 3                  [name]      "

## 2024-11-29 ENCOUNTER — Encounter (HOSPITAL_COMMUNITY): Payer: Self-pay | Admitting: Podiatry

## 2024-12-01 ENCOUNTER — Ambulatory Visit (INDEPENDENT_AMBULATORY_CARE_PROVIDER_SITE_OTHER)

## 2024-12-01 ENCOUNTER — Ambulatory Visit (INDEPENDENT_AMBULATORY_CARE_PROVIDER_SITE_OTHER): Admitting: Podiatry

## 2024-12-01 VITALS — BP 119/62 | HR 77 | Temp 97.2°F

## 2024-12-01 DIAGNOSIS — M62461 Contracture of muscle, right lower leg: Secondary | ICD-10-CM

## 2024-12-01 DIAGNOSIS — M19071 Primary osteoarthritis, right ankle and foot: Secondary | ICD-10-CM

## 2024-12-01 MED ORDER — OXYCODONE-ACETAMINOPHEN 10-325 MG PO TABS: 1.0000 | ORAL_TABLET | Freq: Four times a day (QID) | ORAL | 0 refills | Status: AC | PRN

## 2024-12-01 NOTE — Progress Notes (Signed)
 Patient presents for post-op visit today, POV #1 DOS 11/26/2024 RT TENDO ACHILLES LENGTH, RT TRIPLE ARTHRODESIS.  Doing good. Pain medicine has made me itchy.  Denies visible rash..  Notes: n/a  Vital Signs: Today's Vitals   12/01/24 0814  BP: 119/62  Pulse: 77  Temp: (!) 97.2 F (36.2 C)  TempSrc: Oral  PainSc: 0-No pain      Radiographs: [x]  Taken []  Not taken  Surgical Site Assessment:  - Dressing:  []  Minimal dry blood, intact []  Reinforced   []  Changed     -Notes: moderate dry and fresh blood, intact  - Incision:  [x]  CDI (clean, dry, intact)  [x]  Mild erythema  []  Drainage noted   -Notes: n/a  - Swelling:  []  None  []  Mild  [x]  Moderate   []  Significant     -Notes: n/a  - Bruising:  []  None  [x]  Present: medial ankle, medial calf, plantar aspect foot,lateral ankle and lateral calf   - Sutures/Staples:  []  None [x]  Intact  []  Removed Today  [x]  Plan to remove at next visit   -Cast/Splint/Pins: [x]  None []  Intact []  Removed Today []  Plan to remove at next visit []  Replaced  -Signs of infection:  [x]  None  []  Present - Describe: n/a  -DME:    []  None [x]  AFW []  Surgical shoe []  Cast  []  Splint  -Walking status:  []  Full WB  [x]  Partial WB  []  NWB  -Utilizing device:  []  None []  Knee Scooter []  Crutches [x]  Wheelchair    DVT assessment:  [x]  Denies symptoms []  Chest pain/SOB []  Pain in calf/redness/warmth   Redressed DSD and ace wrap. Educated on signs of infection, proper dressing care, pain management, and weight bearing status. Patient will contact provider with any new or worsening symptoms. The provider assessed the patient today and reviewed instructions regarding plan of care.

## 2024-12-04 ENCOUNTER — Encounter: Payer: Self-pay | Admitting: Podiatry

## 2024-12-08 ENCOUNTER — Encounter

## 2024-12-10 ENCOUNTER — Other Ambulatory Visit: Payer: Self-pay | Admitting: Pulmonary Disease

## 2024-12-14 ENCOUNTER — Other Ambulatory Visit: Payer: Self-pay | Admitting: Pulmonary Disease

## 2024-12-14 NOTE — Telephone Encounter (Unsigned)
 Copied from CRM [id number]. Topic: Clinical - Medication Refill >> Dec 14, 2024  8:25 AM LaVerne A wrote: Medication: zolpidem  (AMBIEN ) 10 MG tablet  Has the patient contacted their pharmacy? Yes (Agent: If no, request that the patient contact the pharmacy for the refill. If patient does not wish to contact the pharmacy document the reason why and proceed with request.) (Agent: If yes, when and what did the pharmacy advise?)  This is the patient's preferred pharmacy:  [id number] - [place], [place] - [address] [place] [address] Phone: [phone] Fax: [phone]  Is this the correct pharmacy for this prescription? Yes If no, delete pharmacy and type the correct one.   Has the prescription been filled recently? Yes  Is the patient out of the medication? Yes, has one pill left  Has the patient been seen for an appointment in the last year OR does the patient have an upcoming appointment? Yes  Can we respond through MyChart? Yes  Agent: Please be advised that Rx refills may take up to 3 business days. We ask that you follow-up with your pharmacy.

## 2024-12-15 ENCOUNTER — Encounter: Payer: Self-pay | Admitting: Podiatry

## 2024-12-15 ENCOUNTER — Ambulatory Visit (INDEPENDENT_AMBULATORY_CARE_PROVIDER_SITE_OTHER): Admitting: Podiatry

## 2024-12-15 VITALS — Ht 63.0 in | Wt 208.0 lb

## 2024-12-15 DIAGNOSIS — M19071 Primary osteoarthritis, right ankle and foot: Secondary | ICD-10-CM

## 2024-12-15 MED ORDER — ZOLPIDEM TARTRATE 10 MG PO TABS: 10.0000 mg | ORAL_TABLET | Freq: Every day | ORAL | 2 refills | Status: AC

## 2024-12-15 NOTE — Progress Notes (Signed)
" ° °  Chief Complaint  Patient presents with   Routine Post Op    POV #2 DOS 11/26/2024 RT TENDO ACHILLES LENGTH, RT TRIPLE ARTHRODESIS,    Subjective:  Patient presents today status post triple arthrodesis with tendo Achilles lengthening right lower extremity.  DOS: 11/26/2024.  Doing well.  She has been nonweightbearing as instructed in the cam boot.  No new complaints  Past Medical History:  Diagnosis Date   Anxiety    Arthritis    Depression    Sleep apnea    No CPAP or mouth guard    Past Surgical History:  Procedure Laterality Date   ACHILLES TENDON LENGTHENING Right 11/26/2024   Procedure: LENGTHENING, TENDON, ACHILLES;  Surgeon: Janit Thresa HERO, DPM;  Location: [facility] OR;  Service: Orthopedics/Podiatry;  Laterality: Right;   ANKLE SURGERY Right 2008   BACK SURGERY  2009   BUNIONECTOMY Right    COLONOSCOPY     COSMETIC SURGERY     FOOT ARTHRODESIS Right 11/26/2024   Procedure: FUSION, JOINT, FOOT;  Surgeon: Janit Thresa HERO, DPM;  Location: [facility] OR;  Service: Orthopedics/Podiatry;  Laterality: Right;   JOINT REPLACEMENT  '13, '21   r knee, r shoulder   NISSEN FUNDOPLICATION     REPLACEMENT TOTAL KNEE Right 2012   SPINE SURGERY  '09   back   TONSILLECTOMY     TOTAL KNEE ARTHROPLASTY Left 03/09/2024   Procedure: ARTHROPLASTY, KNEE, TOTAL;  Surgeon: Vernetta Lonni GRADE, MD;  Location: [facility] OR;  Service: Orthopedics;  Laterality: Left;   TOTAL SHOULDER ARTHROPLASTY Right 03/28/2020   Procedure: RIGHT SHOULDER REPLACEMENT;  Surgeon: Addie Cordella Hamilton, MD;  Location: [facility] OR;  Service: Orthopedics;  Laterality: Right;    Allergies[1]  Objective/Physical Exam Neurovascular status intact.  Incision well coapted with sutures and staples intact. No sign of infectious process noted. No dehiscence. No active bleeding noted.  Moderate edema noted to the surgical extremity.  Radiographic Exam RT foot and ankle 12/01/2024:  Orthopedic hardware and arthrodesis sites appear to be stable  with routine healing.  No complicating features noted  Assessment: 1. s/p triple arthrodesis with tendo Achilles lengthening right. DOS: 11/26/2024   Plan of Care:  -Patient was evaluated.  - Sutures and staples removed today.  Dressings applied -Patient may begin washing and showering getting the foot wet beginning Monday, 12/20/2024 -Ace wrap provided to reapply after bathing -Continue strict nonweightbearing in the cam boot -Return to clinic 2 weeks follow-up x-ray RT foot and ankle   Thresa EMERSON Janit, DPM [facility]  Dr. Thresa EMERSON Janit, DPM    [address] [place], [place] [address]                Office [phone]  Fax [phone]                 [1] No Known Allergies  "

## 2024-12-22 ENCOUNTER — Encounter: Admitting: Podiatry

## 2024-12-29 ENCOUNTER — Encounter: Payer: Self-pay | Admitting: Podiatry

## 2024-12-29 ENCOUNTER — Ambulatory Visit

## 2024-12-29 ENCOUNTER — Ambulatory Visit (INDEPENDENT_AMBULATORY_CARE_PROVIDER_SITE_OTHER): Admitting: Podiatry

## 2024-12-29 VITALS — Ht 63.0 in | Wt 208.0 lb

## 2024-12-29 DIAGNOSIS — M2141 Flat foot [pes planus] (acquired), right foot: Secondary | ICD-10-CM | POA: Diagnosis not present

## 2024-12-29 DIAGNOSIS — M19071 Primary osteoarthritis, right ankle and foot: Secondary | ICD-10-CM

## 2024-12-29 NOTE — Progress Notes (Signed)
" ° °  Chief Complaint  Patient presents with   Routine Post Op    POV #3 DOS 11/26/2024 RT TENDO ACHILLES LENGTH, RT TRIPLE ARTHRODESIS (Kendyll Huettner)     Subjective:  Patient presents today status post triple arthrodesis with tendo Achilles lengthening right lower extremity.  DOS: 11/26/2024.  Doing well.  She has been nonweightbearing as instructed in the cam boot.  No new complaints  Past Medical History:  Diagnosis Date   Anxiety    Arthritis    Depression    Sleep apnea    No CPAP or mouth guard    Past Surgical History:  Procedure Laterality Date   ACHILLES TENDON LENGTHENING Right 11/26/2024   Procedure: LENGTHENING, TENDON, ACHILLES;  Surgeon: Janit Thresa HERO, DPM;  Location: [facility] OR;  Service: Orthopedics/Podiatry;  Laterality: Right;   ANKLE SURGERY Right 2008   BACK SURGERY  2009   BUNIONECTOMY Right    COLONOSCOPY     COSMETIC SURGERY     FOOT ARTHRODESIS Right 11/26/2024   Procedure: FUSION, JOINT, FOOT;  Surgeon: Janit Thresa HERO, DPM;  Location: [facility] OR;  Service: Orthopedics/Podiatry;  Laterality: Right;   JOINT REPLACEMENT  '13, '21   r knee, r shoulder   NISSEN FUNDOPLICATION     REPLACEMENT TOTAL KNEE Right 2012   SPINE SURGERY  '09   back   TONSILLECTOMY     TOTAL KNEE ARTHROPLASTY Left 03/09/2024   Procedure: ARTHROPLASTY, KNEE, TOTAL;  Surgeon: Vernetta Lonni GRADE, MD;  Location: [facility] OR;  Service: Orthopedics;  Laterality: Left;   TOTAL SHOULDER ARTHROPLASTY Right 03/28/2020   Procedure: RIGHT SHOULDER REPLACEMENT;  Surgeon: Addie Cordella Hamilton, MD;  Location: [facility] OR;  Service: Orthopedics;  Laterality: Right;    Allergies[1]  Objective/Physical Exam Neurovascular status intact.  Incision nicely healed.  Continued moderate edema noted.  No erythema or concern for infection  Radiographic Exam RT foot and ankle 12/29/2024:  Orthopedic hardware and arthrodesis sites appear to be stable with routine healing.  No complicating features noted  Assessment: 1. s/p  triple arthrodesis with tendo Achilles lengthening right. DOS: 11/26/2024   Plan of Care:  -Patient was evaluated.  X-rays reviewed - Overall patient is doing well.  Continue strict nonweightbearing to the extremity. -Return to clinic 4 weeks to initiate weightbearing and physical therapy   Thresa EMERSON Janit, DPM [facility]  Dr. Thresa EMERSON Janit, DPM    [address] [place], [place] [address]                Office [phone]  Fax [phone]                 [1] No Known Allergies  "

## 2025-01-19 ENCOUNTER — Encounter: Admitting: Podiatry

## 2025-01-24 ENCOUNTER — Ambulatory Visit: Admitting: Orthopaedic Surgery

## 2025-05-13 ENCOUNTER — Ambulatory Visit: Admitting: Family

## 2025-08-09 ENCOUNTER — Ambulatory Visit
